# Patient Record
Sex: Female | Born: 1935 | Race: White | Hispanic: No | Marital: Single | Smoking: Former smoker
Health system: Southern US, Community
[De-identification: ages and names within clinical notes are randomized; demographics above are authoritative.]

## PROBLEM LIST (undated history)

## (undated) DIAGNOSIS — E785 Hyperlipidemia, unspecified: Secondary | ICD-10-CM

## (undated) DIAGNOSIS — R911 Solitary pulmonary nodule: Secondary | ICD-10-CM

## (undated) DIAGNOSIS — J8410 Pulmonary fibrosis, unspecified: Secondary | ICD-10-CM

## (undated) DIAGNOSIS — F0280 Dementia in other diseases classified elsewhere without behavioral disturbance: Secondary | ICD-10-CM

## (undated) DIAGNOSIS — I491 Atrial premature depolarization: Secondary | ICD-10-CM

## (undated) DIAGNOSIS — I10 Essential (primary) hypertension: Secondary | ICD-10-CM

## (undated) DIAGNOSIS — R002 Palpitations: Secondary | ICD-10-CM

## (undated) HISTORY — DX: Solitary pulmonary nodule: R91.1

## (undated) HISTORY — DX: Hyperlipidemia, unspecified: E78.5

## (undated) HISTORY — DX: Atrial premature depolarization: I49.1

## (undated) HISTORY — DX: Palpitations: R00.2

## (undated) HISTORY — PX: DILATION AND CURETTAGE OF UTERUS: SHX78

## (undated) HISTORY — DX: Essential (primary) hypertension: I10

---

## 1997-10-10 ENCOUNTER — Other Ambulatory Visit: Admission: RE | Admit: 1997-10-10 | Discharge: 1997-10-10 | Payer: Self-pay | Admitting: *Deleted

## 1998-09-22 ENCOUNTER — Ambulatory Visit (HOSPITAL_COMMUNITY): Admission: RE | Admit: 1998-09-22 | Discharge: 1998-09-22 | Payer: Self-pay | Admitting: Specialist

## 1998-10-13 ENCOUNTER — Other Ambulatory Visit: Admission: RE | Admit: 1998-10-13 | Discharge: 1998-10-13 | Payer: Self-pay | Admitting: *Deleted

## 1999-01-14 ENCOUNTER — Encounter: Admission: RE | Admit: 1999-01-14 | Discharge: 1999-01-14 | Payer: Self-pay | Admitting: *Deleted

## 1999-01-14 ENCOUNTER — Encounter: Payer: Self-pay | Admitting: *Deleted

## 1999-10-27 ENCOUNTER — Other Ambulatory Visit: Admission: RE | Admit: 1999-10-27 | Discharge: 1999-10-27 | Payer: Self-pay | Admitting: *Deleted

## 2000-01-22 ENCOUNTER — Encounter: Admission: RE | Admit: 2000-01-22 | Discharge: 2000-01-22 | Payer: Self-pay | Admitting: Rheumatology

## 2000-01-22 ENCOUNTER — Encounter: Payer: Self-pay | Admitting: Rheumatology

## 2000-10-31 ENCOUNTER — Other Ambulatory Visit: Admission: RE | Admit: 2000-10-31 | Discharge: 2000-10-31 | Payer: Self-pay | Admitting: *Deleted

## 2001-01-23 ENCOUNTER — Encounter: Payer: Self-pay | Admitting: Internal Medicine

## 2001-01-23 ENCOUNTER — Encounter: Admission: RE | Admit: 2001-01-23 | Discharge: 2001-01-23 | Payer: Self-pay | Admitting: Internal Medicine

## 2001-03-15 HISTORY — PX: BREAST EXCISIONAL BIOPSY: SUR124

## 2001-08-16 ENCOUNTER — Ambulatory Visit (HOSPITAL_COMMUNITY): Admission: RE | Admit: 2001-08-16 | Discharge: 2001-08-16 | Payer: Self-pay | Admitting: Gastroenterology

## 2002-02-12 ENCOUNTER — Encounter: Payer: Self-pay | Admitting: Internal Medicine

## 2002-02-12 ENCOUNTER — Encounter: Admission: RE | Admit: 2002-02-12 | Discharge: 2002-02-12 | Payer: Self-pay | Admitting: Internal Medicine

## 2002-08-24 ENCOUNTER — Encounter: Admission: RE | Admit: 2002-08-24 | Discharge: 2002-08-24 | Payer: Self-pay | Admitting: General Surgery

## 2002-08-24 ENCOUNTER — Encounter: Payer: Self-pay | Admitting: General Surgery

## 2002-08-27 ENCOUNTER — Ambulatory Visit (HOSPITAL_BASED_OUTPATIENT_CLINIC_OR_DEPARTMENT_OTHER): Admission: RE | Admit: 2002-08-27 | Discharge: 2002-08-27 | Payer: Self-pay | Admitting: General Surgery

## 2002-08-27 ENCOUNTER — Encounter (INDEPENDENT_AMBULATORY_CARE_PROVIDER_SITE_OTHER): Payer: Self-pay | Admitting: Specialist

## 2003-02-26 ENCOUNTER — Encounter: Admission: RE | Admit: 2003-02-26 | Discharge: 2003-02-26 | Payer: Self-pay | Admitting: Internal Medicine

## 2004-03-18 ENCOUNTER — Ambulatory Visit (HOSPITAL_COMMUNITY): Admission: RE | Admit: 2004-03-18 | Discharge: 2004-03-18 | Payer: Self-pay | Admitting: Internal Medicine

## 2004-09-21 ENCOUNTER — Encounter: Admission: RE | Admit: 2004-09-21 | Discharge: 2004-09-21 | Payer: Self-pay | Admitting: Orthopedic Surgery

## 2004-09-22 ENCOUNTER — Ambulatory Visit (HOSPITAL_COMMUNITY): Admission: RE | Admit: 2004-09-22 | Discharge: 2004-09-22 | Payer: Self-pay | Admitting: Orthopedic Surgery

## 2004-09-22 ENCOUNTER — Ambulatory Visit (HOSPITAL_BASED_OUTPATIENT_CLINIC_OR_DEPARTMENT_OTHER): Admission: RE | Admit: 2004-09-22 | Discharge: 2004-09-22 | Payer: Self-pay | Admitting: Orthopedic Surgery

## 2004-09-23 ENCOUNTER — Encounter: Admission: RE | Admit: 2004-09-23 | Discharge: 2004-09-23 | Payer: Self-pay | Admitting: Orthopedic Surgery

## 2004-10-05 ENCOUNTER — Ambulatory Visit (HOSPITAL_COMMUNITY): Admission: RE | Admit: 2004-10-05 | Discharge: 2004-10-05 | Payer: Self-pay | Admitting: Thoracic Surgery

## 2004-10-19 ENCOUNTER — Ambulatory Visit (HOSPITAL_COMMUNITY): Admission: RE | Admit: 2004-10-19 | Discharge: 2004-10-19 | Payer: Self-pay | Admitting: Thoracic Surgery

## 2004-10-19 ENCOUNTER — Encounter (INDEPENDENT_AMBULATORY_CARE_PROVIDER_SITE_OTHER): Payer: Self-pay | Admitting: *Deleted

## 2004-11-18 ENCOUNTER — Encounter: Admission: RE | Admit: 2004-11-18 | Discharge: 2004-11-18 | Payer: Self-pay | Admitting: Thoracic Surgery

## 2005-03-03 ENCOUNTER — Encounter: Admission: RE | Admit: 2005-03-03 | Discharge: 2005-03-03 | Payer: Self-pay | Admitting: Thoracic Surgery

## 2005-03-23 ENCOUNTER — Encounter: Admission: RE | Admit: 2005-03-23 | Discharge: 2005-03-23 | Payer: Self-pay | Admitting: Family Medicine

## 2005-07-07 ENCOUNTER — Encounter: Admission: RE | Admit: 2005-07-07 | Discharge: 2005-07-07 | Payer: Self-pay | Admitting: Thoracic Surgery

## 2006-01-11 ENCOUNTER — Encounter: Admission: RE | Admit: 2006-01-11 | Discharge: 2006-01-11 | Payer: Self-pay | Admitting: Thoracic Surgery

## 2006-03-30 ENCOUNTER — Ambulatory Visit (HOSPITAL_COMMUNITY): Admission: RE | Admit: 2006-03-30 | Discharge: 2006-03-30 | Payer: Self-pay | Admitting: Family Medicine

## 2006-07-12 ENCOUNTER — Encounter: Admission: RE | Admit: 2006-07-12 | Discharge: 2006-07-12 | Payer: Self-pay | Admitting: Thoracic Surgery

## 2006-07-12 ENCOUNTER — Ambulatory Visit: Payer: Self-pay | Admitting: Thoracic Surgery

## 2007-01-10 ENCOUNTER — Ambulatory Visit: Payer: Self-pay | Admitting: Thoracic Surgery

## 2007-01-10 ENCOUNTER — Encounter: Admission: RE | Admit: 2007-01-10 | Discharge: 2007-01-10 | Payer: Self-pay | Admitting: Thoracic Surgery

## 2007-04-05 ENCOUNTER — Ambulatory Visit (HOSPITAL_COMMUNITY): Admission: RE | Admit: 2007-04-05 | Discharge: 2007-04-05 | Payer: Self-pay | Admitting: Family Medicine

## 2007-05-12 ENCOUNTER — Encounter: Admission: RE | Admit: 2007-05-12 | Discharge: 2007-05-12 | Payer: Self-pay | Admitting: Family Medicine

## 2007-06-22 ENCOUNTER — Encounter: Admission: RE | Admit: 2007-06-22 | Discharge: 2007-06-22 | Payer: Self-pay | Admitting: Thoracic Surgery

## 2007-06-22 ENCOUNTER — Ambulatory Visit: Payer: Self-pay | Admitting: Thoracic Surgery

## 2007-07-07 ENCOUNTER — Ambulatory Visit: Payer: Self-pay | Admitting: Pulmonary Disease

## 2007-07-07 DIAGNOSIS — R930 Abnormal findings on diagnostic imaging of skull and head, not elsewhere classified: Secondary | ICD-10-CM | POA: Insufficient documentation

## 2007-07-07 DIAGNOSIS — E785 Hyperlipidemia, unspecified: Secondary | ICD-10-CM | POA: Insufficient documentation

## 2007-07-07 DIAGNOSIS — J471 Bronchiectasis with (acute) exacerbation: Secondary | ICD-10-CM | POA: Insufficient documentation

## 2007-10-09 ENCOUNTER — Telehealth (INDEPENDENT_AMBULATORY_CARE_PROVIDER_SITE_OTHER): Payer: Self-pay | Admitting: *Deleted

## 2007-10-10 ENCOUNTER — Ambulatory Visit: Payer: Self-pay | Admitting: Pulmonary Disease

## 2007-10-14 ENCOUNTER — Encounter: Payer: Self-pay | Admitting: Pulmonary Disease

## 2007-10-17 ENCOUNTER — Telehealth: Payer: Self-pay | Admitting: Pulmonary Disease

## 2007-10-17 ENCOUNTER — Ambulatory Visit: Payer: Self-pay | Admitting: Pulmonary Disease

## 2007-10-18 ENCOUNTER — Encounter: Payer: Self-pay | Admitting: Pulmonary Disease

## 2007-11-15 ENCOUNTER — Ambulatory Visit: Payer: Self-pay | Admitting: Pulmonary Disease

## 2007-11-15 DIAGNOSIS — J479 Bronchiectasis, uncomplicated: Secondary | ICD-10-CM

## 2007-11-15 DIAGNOSIS — A310 Pulmonary mycobacterial infection: Secondary | ICD-10-CM | POA: Insufficient documentation

## 2007-11-15 HISTORY — DX: Bronchiectasis, uncomplicated: J47.9

## 2007-12-12 ENCOUNTER — Telehealth (INDEPENDENT_AMBULATORY_CARE_PROVIDER_SITE_OTHER): Payer: Self-pay | Admitting: *Deleted

## 2008-01-02 ENCOUNTER — Encounter: Admission: RE | Admit: 2008-01-02 | Discharge: 2008-01-02 | Payer: Self-pay | Admitting: Thoracic Surgery

## 2008-01-02 ENCOUNTER — Ambulatory Visit: Payer: Self-pay | Admitting: Thoracic Surgery

## 2008-01-08 ENCOUNTER — Telehealth (INDEPENDENT_AMBULATORY_CARE_PROVIDER_SITE_OTHER): Payer: Self-pay | Admitting: *Deleted

## 2008-01-08 ENCOUNTER — Ambulatory Visit: Payer: Self-pay | Admitting: Pulmonary Disease

## 2008-01-10 ENCOUNTER — Telehealth: Payer: Self-pay | Admitting: Pulmonary Disease

## 2008-03-19 ENCOUNTER — Ambulatory Visit: Payer: Self-pay | Admitting: Pulmonary Disease

## 2008-04-24 ENCOUNTER — Ambulatory Visit (HOSPITAL_COMMUNITY): Admission: RE | Admit: 2008-04-24 | Discharge: 2008-04-24 | Payer: Self-pay | Admitting: Family Medicine

## 2008-09-17 ENCOUNTER — Ambulatory Visit: Payer: Self-pay | Admitting: Pulmonary Disease

## 2009-05-07 ENCOUNTER — Ambulatory Visit (HOSPITAL_COMMUNITY): Admission: RE | Admit: 2009-05-07 | Discharge: 2009-05-07 | Payer: Self-pay | Admitting: Family Medicine

## 2009-08-04 ENCOUNTER — Ambulatory Visit: Payer: Self-pay | Admitting: Pulmonary Disease

## 2009-08-04 DIAGNOSIS — R0789 Other chest pain: Secondary | ICD-10-CM | POA: Insufficient documentation

## 2010-02-20 ENCOUNTER — Ambulatory Visit: Payer: Self-pay | Admitting: Cardiology

## 2010-02-23 ENCOUNTER — Ambulatory Visit: Payer: Self-pay | Admitting: Cardiology

## 2010-04-02 ENCOUNTER — Other Ambulatory Visit (HOSPITAL_COMMUNITY): Payer: Self-pay | Admitting: Family Medicine

## 2010-04-02 DIAGNOSIS — Z0000 Encounter for general adult medical examination without abnormal findings: Secondary | ICD-10-CM

## 2010-04-02 DIAGNOSIS — Z1231 Encounter for screening mammogram for malignant neoplasm of breast: Secondary | ICD-10-CM

## 2010-04-14 NOTE — Progress Notes (Signed)
Summary: coughing up blood  Phone Note Call from Patient   Caller: Patient Call For: clance Summary of Call: pt coughed up blood concern new medication could have caused it Initial call taken by: Rickard Patience,  January 10, 2008 8:09 AM  Follow-up for Phone Call        Pt saw [facility] on 01/08/08 tx with Omnicef 300mg  2 tabs once daily x 7 days.  Pt states she coughed up BRB last night, clot of blood approx 1/2 tsp in amt.  Had 1 more episode this am similar amt.  Cough has improved since pt has been on abx.  Please advise. Follow-up by: Cloyde Reams RN,  January 10, 2008 2:26 PM  Additional Follow-up for Phone Call Additional follow up Details #1::        let pt know that it is common for bronchiectasis pts to cough up small amt of blood with infections.  Would give abx time to work, but call if blood quantity increases, or doesn't go away with abx. Additional Follow-up by: Barbaraann Share MD,  January 11, 2008 10:35 AM    Additional Follow-up for Phone Call Additional follow up Details #2::    Spoke with pt and advised KC's recs.  She will call if amount of blood increases or does not improve after finishes abx. Vernie Murders  January 11, 2008 10:50 AM

## 2010-04-14 NOTE — Progress Notes (Signed)
Summary: req lab results  Phone Note Call from Patient   Caller: Patient Call For: clance Summary of Call: results of labs from 7/31. (sputum).  Initial call taken by: Tivis Ringer,  October 17, 2007 10:10 AM  Follow-up for Phone Call        Called spoke with pt.  Pt states she is coughing a lot at the present time.  Mostly dry cough currently, but pt thinks it is headed back into an infection like she previously had.  Advised pt to come in and leave another sputum culture.  Pt agreed will put order into [facility] for AFB. Do you want to tx symptoms? Follow-up by: Cloyde Reams RN,  October 17, 2007 2:42 PM        Appended Document: req lab results ok to call in omnicef 300mg  two times a day for 5 days, no fills.  Appended Document: req lab results pt aware, rx was sent electronically

## 2010-04-14 NOTE — Assessment & Plan Note (Signed)
Summary: rov for bronchiectasis   Copy to:  Melinda Edwards Primary Provider/Referring Provider:  Maurice Edwards  CC:  Pt is here for a 6 month f/u appt.  Pt denied any changes in breathing. Pt states she will occ. have a non-productive cough.  Pt does c/o hoarseness and difficulty swallowing.   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  She is doing quite well, with no cough or congestion.  She denies any breathing issues, and has had no recent chest infections.  Denies fevers,chills, sweats.  Current Medications (verified): 1)  Lipitor 20 Mg  Tabs (Atorvastatin Calcium) .... Take 1 Tablet By Mouth Once A Day 2)  Lanoxin 0.125 Mg  Tabs (Digoxin) .... Take 1 Tablet By Mouth Once A Day 3)  Multivitamins   Tabs (Multiple Vitamin) .... Take 1 Tablet By Mouth Once A Day 4)  Aspirin Low Dose 81 Mg  Tabs (Aspirin) .... Take 1 Tablet By Mouth Twice A Week 5)  Caltrate 600+d 600-400 Mg-Unit  Tabs (Calcium Carbonate-Vitamin D) .... Take 1 Tablet By Mouth Two Times A Day 6)  Fish Oil 1000 Mg  Caps (Omega-3 Fatty Acids) .... Take 1 Tablet By Mouth Two Times A Day 7)  Simply Sleep .... Take By Mouth As Needed 8)  Benadryl .... Take By Mouth As Needed 9)  Tylenol .... Take By Mouth As Needed  Allergies (verified): 1)  ! Claritin  Vital Signs:  Patient profile:   77 year old female Height:      65 inches Weight:      115.25 pounds BMI:     19.25 O2 Sat:      97 % on Room air Temp:     97.4 degrees F oral Pulse rate:   120 / minute BP sitting:   112 / 74  (right arm) Cuff size:   regular  Vitals Entered By: Melinda Filter LPN (September 17, 1608 8:51 AM)  O2 Flow:  Room air CC: Pt is here for a 6 month f/u appt.  Pt denied any changes in breathing. Pt states she will occ. have a non-productive cough.  Pt does c/o hoarseness and difficulty swallowing.    Comments Medications reviewed with patient Melinda Filter LPN  September 18, 9602  8:51 AM    Physical Exam  General:  thin female in nad Lungs:  clear to auscultation Heart:  rrr, no mrg   Impression & Recommendations:  Problem # 1:  BRONCHIECTASIS WITHOUT ACUTE EXACERBATION (ICD-494.0) the pt is doing well from this standpoint.  She has had no recent exacerbation, no cough, no mucus.  I have asked her to stay active, and will see her back in one year.  Medications Added to Medication List This Visit: 1)  Aspirin Low Dose 81 Mg Tabs (Aspirin) .... Take 1 tablet by mouth twice a week 2)  Caltrate 600+d 600-400 Mg-unit Tabs (Calcium carbonate-vitamin d) .... Take 1 tablet by mouth two times a day 3)  Fish Oil 1000 Mg Caps (Omega-3 fatty acids) .... Take 1 tablet by mouth two times a day 4)  Simply Sleep  .... Take by mouth as needed 5)  Benadryl  .... Take by mouth as needed 6)  Tylenol  .... Take by mouth as needed  Other Orders: Est. Patient Level II (54098)  Patient Instructions: 1)  follow up with me in one year or sooner if problems.

## 2010-04-14 NOTE — Assessment & Plan Note (Signed)
Summary: consult for bronchiectasis   Referred by:  Dewayne Shorter PCP:  Maurice Small  Chief Complaint:  Pulmonary Consult.  History of Present Illness: the patient is a pleasant 77 year old female who I have been asked to see for an abnormal chest CT.  The patient has a history of a right lower lobe pulmonary nodule that is being followed with serial chest CTs.  On the last scan, she was also noted to have significant bronchiectasis in her right middle lobe and lingula, with superimposed nodular densities.  She also has had a recent cough with purulent mucus that is most consistent with acute bronchitis.  She responded very nicely to a course of Avelox from Dr. Edwyna Shell.  The patient states that at her baseline she has no significant cough or mucus production.  She has excellent exercise tolerance, and feels that she has no chronic pulmonary symptoms.  She has had no fevers, chills, or sweats, and her appetite has been stable.  She has had no significant weight loss.  There is no history of prior tuberculosis exposure.     Current Allergies: ! CLARITIN  Past Medical History:    Hyperlipidemia    history of pulmonary nodule   Family History:    heart disease: 3 brothers (m.i.) mother (CHF)  Social History:    Patient states former smoker.     pt is retired.    pt single.   Risk Factors:  Tobacco use:  quit    Year quit:  1984    Pack-years:  1 ppd    Comments:  smoked for approx 25 years    Review of Systems      See HPI   Vital Signs:  Patient Profile:   77 Years Old Female Height:     65 inches Weight:      116.25 pounds O2 Sat:      96 % O2 treatment:    Room Air Temp:     98.0 degrees F oral Pulse rate:   74 / minute BP sitting:   120 / 66  (left arm) Cuff size:   regular  Vitals Entered By: Cyndia Diver LPN (July 07, 2007 11:08 AM)             Is Patient Diabetic? No Comments Medications reviewed with patient    ..................................................................Marland KitchenCyndia Diver LPN  July 07, 2007 11:09 AM      Physical Exam  General:     thin female in no acute distress Eyes:     PERRLA and EOMI.   Nose:     patent without discharge Mouth:     clear  Neck:     no JVD, thyromegaly, or palpable lymphadenopathy Lungs:     totally clear to auscultation Heart:     regular rate and rhythm, no rubs or gallops.  There was a 1/6 systolic ejection murmur Abdomen:     soft and nontender, bowel sounds present Extremities:     no significant edema, pulses intact distally Neurologic:     alert and oriented, moves all 4 extremities.     Impression & Recommendations:  Problem # 1:  BRONCHIECTASIS WITH ACUTE EXACERBATION (ICD-494.1) the patient has classic right middle lobe and lingular bronchiectasis.  She is really not had a lot of difficulties with this, and I suspect that her recent infection was a mild flare.  She was treated appropriately with Avelox, and feels that she is back to her usual baseline.  I have had a  long discussion with her about bronchiectasis, and have answered all of her questions.  I see no reason why she cannot do very well with this, and will not need further pulmonary follow-up unless she begins to have more frequent infections.  I would like to get a sputum specimen if she has another acute exacerbation for both culture and AFB.  This will allow [place] to treat her with appropriate antibiotics focused on her known pathogen  Problem # 2:  CT, CHEST, ABNORMAL (ICD-793.1) the patient has concomitant nodular densities within her bronchiectasis.  I suspect that she is colonized with MAC.  We can try and establish this with a sputum culture if and when she has her next flareup.  At this point, there is nothing to suggest any more than just colonization.  Medications Added to Medication List This Visit: 1)  Lipitor 20 Mg Tabs (Atorvastatin calcium) .... Take 1 tablet  by mouth once a day 2)  Lanoxin 0.125 Mg Tabs (Digoxin) .... Take 1 tablet by mouth once a day 3)  Multivitamins Tabs (Multiple vitamin) .... Take 1 tablet by mouth once a day 4)  Aspirin Low Dose 81 Mg Tabs (Aspirin) .... Take 1 tablet by mouth once a day 5)  Caltrate 600+d 600-400 Mg-unit Tabs (Calcium carbonate-vitamin d) .... Take 1 tablet by mouth once a day 6)  Fish Oil 1000 Mg Caps (Omega-3 fatty acids) .... Take 1 tablet by mouth once a day 7)  Vitamin B12  .... Take 1 tablet by mouth once a day   Patient Instructions: 1)  the patient will need a sputum culture and sensitivity, as well as an AFB culture and sensitivity if she has another flareup. 2)  She will follow-up with me on a p.r.n. basis.    ]

## 2010-04-14 NOTE — Assessment & Plan Note (Signed)
Summary: rov for bronchiectasis   Referred by:  Dewayne Shorter PCP:  Maurice Small  Chief Complaint:  4 month follow-up visit.  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  She had a flare-up in Oct, but responded very well to abx.  She currently has no complaints, including cough or congestion.  She is eating well, and no specific constitutional symptoms.     Prior Medications Reviewed Using: Patient Recall  Updated Prior Medication List: LIPITOR 20 MG  TABS (ATORVASTATIN CALCIUM) Take 1 tablet by mouth once a day LANOXIN 0.125 MG  TABS (DIGOXIN) Take 1 tablet by mouth once a day MULTIVITAMINS   TABS (MULTIPLE VITAMIN) Take 1 tablet by mouth once a day ASPIRIN LOW DOSE 81 MG  TABS (ASPIRIN) Take 1 tablet by mouth once a day CALTRATE 600+D 600-400 MG-UNIT  TABS (CALCIUM CARBONATE-VITAMIN D) Take 1 tablet by mouth once a day FISH OIL 1000 MG  CAPS (OMEGA-3 FATTY ACIDS) Take 1 tablet by mouth once a day  Current Allergies (reviewed today): ! CLARITIN     Review of Systems      See HPI   Vital Signs:  Patient Profile:   77 Years Old Female Height:     65 inches Weight:      111.13 pounds O2 Sat:      94 % O2 treatment:    Room Air Temp:     97.5 degrees F oral Pulse rate:   79 / minute BP sitting:   132 / 74  (left arm) Cuff size:   regular  Vitals Entered By: Cloyde Reams RN (March 19, 2008 9:00 AM)             Comments Pt is here today for a follow-up visit.  Pt states breathing well.  Pt states cough is much better. Medications reviewed Cloyde Reams RN  March 19, 2008 9:03 AM      Physical Exam  General:     thin female in nad Lungs:     totally clear  Heart:     rrr      Impression & Recommendations:  Problem # 1:  BRONCHIECTASIS WITHOUT ACUTE EXACERBATION (ICD-494.0) the pt is doing well with no recent flare-ups.  She has no cough or congestion.  She will f/u with me in 6mos,  or sooner if problems develop.  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  She does have nodules that are probably attributable to this.   Patient Instructions: 1)  Please schedule a follow-up appointment in 6 months.   ]  Appended Document: Orders Update    Clinical Lists Changes  Orders: Added new Service order of Est. Patient Level II (16109) - Signed

## 2010-04-14 NOTE — Progress Notes (Signed)
Summary: sick  Phone Note Call from Patient Call back at Home Phone [phone]   Caller: Patient Call For: clance Reason for Call: Acute Illness, Talk to Nurse Summary of Call: coughing like crazy, discolored plegm, all in her head right now, but feels some is coming from chest. [facility] Initial call taken by: Eugene Gavia,  January 08, 2008 8:32 AM  Follow-up for Phone Call        Called spoke with pt.  Made appt today with VS at 10:00am.   Follow-up by: Cloyde Reams RN,  January 08, 2008 9:03 AM

## 2010-04-14 NOTE — Progress Notes (Signed)
Summary: set up labs- call pt first  Phone Note Call from Patient   Caller: Patient Call For: clance Summary of Call: thinks she may need labs set up for her "bronchiatasis". culture of her phlegm Initial call taken by: Tivis Ringer,  October 09, 2007 12:30 PM  Follow-up for Phone Call        lmtcb Philipp Deputy [facility]  October 09, 2007 3:18 PM   pt aware ok for sputum culture-order in comp-per dr clance's last note Follow-up by: Philipp Deputy CMA,  October 09, 2007 5:17 PM

## 2010-04-14 NOTE — Progress Notes (Signed)
Summary: H1N1  Phone Note Call from Patient Call back at Home Phone [phone]   Caller: Patient Call For: clance Reason for Call: Talk to Nurse Summary of Call: pt wants to know if she should take H1N1 shot when available?  If pt not available, LMOM> Initial call taken by: Eugene Gavia,  December 12, 2007 3:25 PM  Follow-up for Phone Call        please advise  if this is ok or not. thanks.  Cyndia Diver LPN  December 12, 2007 3:40 PM   Additional Follow-up for Phone Call Additional follow up Details #1::        I think she should Additional Follow-up by: Barbaraann Share MD,  December 13, 2007 6:09 PM    Additional Follow-up for Phone Call Additional follow up Details #2::    [facility] advising pt that Dr. Shelle Iron does think she should have the H1N1 vaccine when it is available.  I advised her to call back in mid October to see about getting it done here. Follow-up by: Vernie Murders,  December 14, 2007 9:08 AM

## 2010-04-14 NOTE — Assessment & Plan Note (Signed)
Summary: rov for bronchiectasis   Referred by:  Dewayne Shorter PCP:  Maurice Small  Chief Complaint:  Acute visit today.  Pt c/o prod cough x 3 wks with yellow/green sputum.  She was txed with round of omnicef on 10-17-07, this helped some, and but still has prod cough.  Pt denies any other complaints.  Currently taking sulfamethoxazole for UTI.Marland Kitchen  History of Present Illness: The pt comes in today for f/u of her bronchiectasis.  She had a recent flare-up and was treated with omnicef, given her sputum culture growing H.Flu.  Her AFB culture has not been finalized, but the initial smear was negative.  The pt still has a mild cough with small quantities of purulent mucus, but I explained to her that it will almost be impossible to sterilize her secretions, and at the cost of multidrug resistant organisms.  She continues to have no significant sinus symptoms.  She denies significant weight loss, or other constitutional symptoms.  She feels that her exercise tolerance is adequate.     Current Allergies: ! CLARITIN     Review of Systems      See HPI   Vital Signs:  Patient Profile:   77 Years Old Female Height:     65 inches Weight:      111.25 pounds O2 Sat:      95 % O2 treatment:    Room Air Temp:     97.7 degrees F oral Pulse rate:   96 / minute BP sitting:   130 / 78  (left arm)  Vitals Entered By: Vernie Murders (November 15, 2007 9:38 AM)                 Physical Exam  General:     thin female in nad Lungs:     fairly clear with occasional scattered rhonchi Heart:     rrr      Problem # 1:  BRONCHIECTASIS WITHOUT ACUTE EXACERBATION (ICD-494.0) the patient overall is doing fairly well. She does have a mild intermittent cough with small quantities of discolored mucus, but I have explained to her that it may be very difficult to totally sterilize her secretions given her bronchiectasis. I have gone over the "red flags", including fever, increased quantity of mucus, and  increased pulmonary symptoms of congestion and dyspnea. If these occur, she knows to call us immediately for antibiotic treatment. I would like to hold off on antibiotics at this time, and see how she does going forward.  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 If she develops symptoms that are suggestive of actual infection, she will need bronchoscopy for more accurate diagnosis if the expectorated AFB culture is negative.   Patient Instructions: 1)  Please schedule a follow-up appointment in 4 months. 2)  I will call you with results of afb culture.   ]

## 2010-04-14 NOTE — Assessment & Plan Note (Signed)
Summary: acute sick visit for atypical chest pain.   Copy to:  Dewayne Shorter Primary Provider/Referring Provider:  Maurice Small  CC:  Pt is here for a sick visit.  Pt was last seen July 2010.   Pt c/o L side chest pain while lying in bed x 1 week.  Pt denies increased sob or cough.  .  History of Present Illness: the patient comes in today for an acute sick visit related to atypical chest pain. She has a history of known bronchiectasis with MAC colonization, but has done extremely well with no recurrent exacerbation or constitutional symptoms. She was last seen in July of 2010. She comes in today with a one-week history of left-sided chest discomfort while lying in bed, and is concerned that it is related to a pulmonary infection. However, she denies any increasing shortness of breath, congestion, cough, or fever. She has maintained her weight, and has a good appetite. The pain is not pleuritic in nature, and she has not had any significant lower extremity edema.  Current Medications (verified): 1)  Lipitor 20 Mg  Tabs (Atorvastatin Calcium) .... Take 1 Tablet By Mouth Once A Day 2)  Lanoxin 0.125 Mg  Tabs (Digoxin) .... Take 1 Tablet By Mouth Once A Day 3)  Multivitamins   Tabs (Multiple Vitamin) .... Take 1 Tablet By Mouth Once A Day 4)  Aspirin Low Dose 81 Mg  Tabs (Aspirin) .... Take 1 Tablet By Mouth Twice A Week 5)  Caltrate 600+d 600-400 Mg-Unit  Tabs (Calcium Carbonate-Vitamin D) .... Take 1 Tablet By Mouth Two Times A Day 6)  Fish Oil 1000 Mg  Caps (Omega-3 Fatty Acids) .... Take 1 Tablet By Mouth Two Times A Day 7)  Benadryl .... Take By Mouth As Needed 8)  Tylenol .... Take By Mouth As Needed  Allergies (verified): 1)  ! Claritin  Review of Systems       The patient complains of chest pain, difficulty swallowing, sneezing, and joint stiffness or pain.  The patient denies shortness of breath with activity, shortness of breath at rest, productive cough, non-productive cough,  coughing up blood, irregular heartbeats, acid heartburn, indigestion, loss of appetite, weight change, abdominal pain, sore throat, tooth/dental problems, headaches, nasal congestion/difficulty breathing through nose, itching, ear ache, anxiety, depression, hand/feet swelling, rash, change in color of mucus, and fever.    Vital Signs:  Patient profile:   77 year old female Height:      65 inches Weight:      121.13 pounds BMI:     20.23 O2 Sat:      96 % on Room air Temp:     98.2 degrees F oral Pulse rate:   77 / minute BP sitting:   116 / 74  (left arm) Cuff size:   regular  Vitals Entered By: Arman Filter LPN (Aug 04, 2009 2:03 PM)  O2 Flow:  Room air CC: Pt is here for a sick visit.  Pt was last seen July 2010.   Pt c/o L side chest pain while lying in bed x 1 week.  Pt denies increased sob or cough.   Comments Medications reviewed with patient Arman Filter LPN  Aug 04, 2009 2:03 PM    Physical Exam  General:  thin female in nad Nose:  no purulence noted. Lungs:  clear to auscultation Heart:  rrr, no mrg Extremities:  no edema or cyanosis Neurologic:  alert and oriented, moves all 4.   Impression & Recommendations:  Problem # 1:  CHEST PAIN, ATYPICAL (ICD-786.59) the patient's chest discomfort seems more musculoskeletal than anything else. There is nothing to indicate a pulmonary infection, her chest is clear to auscultation, and her chest x-ray today shows no acute changes. I have asked her to try conservative treatment with nonsteroidals and also local heat. She is to let me know if her pain worsens or if it does not resolve.  Problem # 2:  BRONCHIECTASIS WITHOUT ACUTE EXACERBATION (ICD-494.0) the patient is stable from a bronchiectasis standpoint, and there is nothing to suggest an acute exacerbation.  Other Orders: Est. Patient Level IV (16109) T-2 View CXR (71020TC)  Patient Instructions: 1)   would try advil and give this a little more time to resolve 2)   please call if symptoms worsen or if they do not resolve. 3)  cancel upcoming apptm and reschedule for one year.

## 2010-04-14 NOTE — Assessment & Plan Note (Signed)
Summary: acute sick visit for bronchiectasis exacerbation.   Referred by:  Melinda Edwards PCP:  Maurice Small  Chief Complaint:  Pt is here for a sick visit.  pt c/o cough with yellow/green sputum x 4 to 5 days.  pt also c/o yellow nasal drainage. Melinda Edwards  History of Present Illness: the pt comes in today for an acute sick visit.  She gives a 5 day history of cough and congestion with purulent mucus, and also has sinus pressure with purulence from nares.  Denies any fever or worsening sob.     Current Allergies: ! CLARITIN     Review of Systems      See HPI   Vital Signs:  Patient Profile:   77 Years Old Female Height:     65 inches Weight:      114.56 pounds O2 Sat:      94 % O2 treatment:    Room Air Temp:     97.9 degrees F oral Pulse rate:   96 / minute BP sitting:   112 / 78  (left arm) Cuff size:   regular  Vitals Entered By: Cyndia Diver LPN (January 08, 2008 10:27 AM)             Comments Medications reviewed with patient Cyndia Diver LPN  January 08, 2008 10:28 AM        Physical Exam  General:     wd female in nad Lungs:     fairly clear. Heart:     rrr, no mrg      Impression & Recommendations:  Problem # 1:  BRONCHIECTASIS WITH ACUTE EXACERBATION (ICD-494.1) the pt probably has acute sinusitis along with a flare of her bronchiectasis.  She will need a course of antibiotics for this.  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  Recent ct chest shows improvement or stability in her prior findings.  Medications Added to Medication List This Visit: 1)  Omnicef 300 Mg Caps (Cefdinir) .... Two tablets (600 mg) by mouth daily   Patient Instructions: 1)  take omnicef 600mg  each day for 7days 2)  can take mucinex to help with mucus if needed 3)  keep upcoming f/u with me.   Prescriptions: OMNICEF 300 MG  CAPS (CEFDINIR) Two tablets (600 mg) by mouth daily  #14 x 0   Entered and  Authorized by:   Barbaraann Share MD   Signed by:   Barbaraann Share MD on 01/08/2008   Method used:   Electronically to        [facility]  [phone]* (retail)       [address] [place], [place]  [address]       Ph: [phone] or [phone]       Fax: [phone]   RxID:   [email or web address]  ]

## 2010-05-13 ENCOUNTER — Ambulatory Visit (HOSPITAL_COMMUNITY)
Admission: RE | Admit: 2010-05-13 | Discharge: 2010-05-13 | Disposition: A | Discharge status: Home / Self Care | Payer: Medicare Other | Source: Ambulatory Visit | Attending: Family Medicine | Admitting: Family Medicine

## 2010-05-13 DIAGNOSIS — Z1231 Encounter for screening mammogram for malignant neoplasm of breast: Secondary | ICD-10-CM | POA: Insufficient documentation

## 2010-07-28 ENCOUNTER — Telehealth: Payer: Self-pay | Admitting: Cardiology

## 2010-07-28 NOTE — Assessment & Plan Note (Signed)
OFFICE VISIT   Melinda Edwards, Melinda Edwards  DOB:  08-Apr-1926                                        June 22, 2007  CHART #:  [id number]   REASON FOR OFFICE VISIT:  A 59-month followup CT of the chest.   SUMMARY:  The patient was last seen by Dr. Edwyna Shell on January 10, 2007.  At that time the CT of the chest appeared to be similar to the one prior  except there was a question as to whether or not there was one small  area that was more prominent.  The patient had a repeat CAT scan done  which showed a stable 8-mm scar of the right lower lobe, scattered  chronic lung scarring, interval interstitial pneumonitis or scarring in  the lingula and lesser right middle lobe, otherwise stable chronic  findings.  The patient's only complaint at this time is a cough with sputum  production which she describes as yellowish-to-greenish.  This first  occurred in January.  She presented to an urgent care center.  According  to the patient she was told she did not have pneumonia or bronchitis.  She was given antibiotics, however, she continues to still have the  cough with sputum production.  She denies any fever, chills, or night  sweats, denies any shortness of breath.   PHYSICAL EXAMINATION:  Vital signs:  Her blood pressure is 110/64, pulse  rate 79, respirations 18, O2 sat 98% on room air.  Cardiovascular:  Regular rate and rhythm.  Pulmonary:  Lungs are clear to auscultation  bilaterally.  No rales, wheezes, rhonchi.  Lung sounds are slightly  diminished at the bases.  Extremities:  No cyanosis, clubbing, or edema.   IMPRESSION AND PLAN:  The patient was given a prescription for Avelox  400 mg by mouth for 7 days.  She is also going to be referred to a  pulmonologist.  Dr. Edwyna Shell will see her in 6  months with a followup CT of the chest.  Please note that the patient  has been followed for these  inflammatory lesions in her lungs for almost 2 years and it was thought  that  these might be related to mycobacterium avium.  The patient is to  call for any questions or concerns.   Doree Fudge, PA   DZ/MEDQ  D:  06/22/2007  T:  06/22/2007  Job:  [id number]

## 2010-07-28 NOTE — Telephone Encounter (Signed)
Advised patient

## 2010-07-28 NOTE — Telephone Encounter (Signed)
Wanted Melinda Edwards to know that she has been have chest and arm discomfort for the past several days and wants to talk to RN about it.

## 2010-07-28 NOTE — Telephone Encounter (Signed)
Spoke with patient and she stated no chest pains, the pain was in her right shoulder.  The pain did move down into her arm and then her arm felt tired.  Denies any shortness of breath, chest pain, or weakness in right arm.  Does have PCP.  Please advise.

## 2010-07-28 NOTE — Assessment & Plan Note (Signed)
OFFICE VISIT   Melinda Edwards, Melinda Edwards  DOB:  06/04/1926                                        January 02, 2008  CHART #:  [id number]   HISTORY:  The patient has been followed for several years, now for a  abnormal chest CT scan.  She has been followed with serial CT scans and  had one on today's date.  Primarily, she has been treated for two  issues.  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  She has been  seen by Dr. Marcelyn Bruins as well for her pulmonary management.  Currently, she reports that she is feeling well overall.  Her cough has  lessened as has her sputum production.  She reports no new pulmonary  symptoms.   CT scan was obtained and it appears that the primary nodule that we have  been following has actually somewhat lessened in size.  She has other  chronic findings that appeared to be stable.   PHYSICAL EXAMINATION:  VITAL SIGNS:  Blood pressure 118/68, pulse 84,  respirations 18, and oxygen saturation is 98%.  GENERAL:  This is a well-developed adult female in no acute distress.  PULMONARY:  Clear breath sounds throughout.  CARDIAC:  Regular rate and rhythm.   ASSESSMENT:  The patient is doing quite well and is stable from any  thoracic surgical issues and does not appear to require any ongoing  further surgical management.  She will continue followup with Dr. Shelle Iron  as he requests.  We will of course see her as needed on a p.r.n. basis.  Dr. Edwyna Shell has seen the patient on today's date and does not feel that  we need to continue with ongoing followup.   Rowe Clack, P.A.-C.   Melinda Edwards  D:  01/02/2008  T:  01/02/2008  Job:  [id number]   cc:   Barbaraann Share, MD,FCCP  Ines Bloomer, M.D.

## 2010-07-28 NOTE — Letter (Signed)
January 10, 2007   Melinda Edwards. Valentina Lucks, M.D.  [address] [place]  [place] [address]   Re:  [name], [name]               DOB:  09/11/1926   Dear. Dr. Valentina Lucks:   I saw [name] back in the office today. Her blood pressure is  140/82, pulse 80, respiratory rate 18, saturations were 97%. We have  been following these inflammatory lesions in her lungs for almost 2  years and it was thought to be some type of possible microbacterium  avium. We repeated her CT scan today and everything appears to be the  same, except for 1 in the right lower lobe, which they read as possibly  more prominent. I am not sure that is the case but they recommend  following it up again in 6 months and with that being the case, I will  follow their recommendations and see her again in 6 months with a CT  scan.   Ines Bloomer, M.D.  Electronically Signed   DPB/MEDQ  D:  01/10/2007  T:  01/10/2007  Job:  [id number]

## 2010-07-28 NOTE — Telephone Encounter (Signed)
I suggested she check with her primary care provider regarding these symptoms which do not sound cardiac.

## 2010-07-28 NOTE — Telephone Encounter (Signed)
Advised to call PCP,  Verbalized understanding.

## 2010-07-31 ENCOUNTER — Encounter: Payer: Self-pay | Admitting: *Deleted

## 2010-07-31 ENCOUNTER — Encounter: Payer: Self-pay | Admitting: Pulmonary Disease

## 2010-07-31 NOTE — Op Note (Signed)
   NAME:  Melinda Edwards, Melinda Edwards                         ACCOUNT NO.:  [id number]   MEDICAL RECORD NO.:  [id number]                   PATIENT TYPE:  AMB   LOCATION:  DSC                                  FACILITY:  [facility]   PHYSICIAN:  Gita Kudo, M.D.              DATE OF BIRTH:  June 06, 1926   DATE OF PROCEDURE:  08/27/2002  DATE OF DISCHARGE:                                 OPERATIVE REPORT   PROCEDURE:  Excision right breast mass.   SURGEON:  Gita Kudo, M.D.   ANESTHESIA:  MAC - IV sedation, local 1% Xylocaine   PREOPERATIVE DIAGNOSIS:  Mass right breast.   POSTOPERATIVE DIAGNOSIS:  Mass right breast, pending pathology.   CLINICAL SUMMARY:  77 year old female with a lump noted in her right breast.  Needle aspiration was non conclusive.  A mammogram approximately six months  ago was negative.  Physical examination showed an indeterminate mass and  biopsy was elected.   OPERATIVE FINDINGS:  I went widely around the abnormality located in the  right breast at approximately 6:30.  A permanent section was requested.   OPERATIVE PROCEDURE:  Under satisfactory intravenous sedation, the patient's  breast and chest were prepped and draped in the standard fashion.  One  percent Xylocaine was infiltrated for good analgesia.  A curved incision  made centered over the mass and then skin flaps of a small distance were  developed and the abnormal breast tissue removed by a combination of blunt,  cautery and scout pallido section.  After the specimen was removed, it was  marked with suture and sent to pathology.  Careful digital palpation from  inside the wound did not reveal any other  abnormality.  The breast was then lavaged with saline, made dry by cautery  and closed in layers with interrupted 3-0 Vicryl and nylon for skin.  Sterile absorbant compressive dressing applied and the patient went to the  recovery room from the operating room in good condition without   complication.                                                Gita Kudo, M.D.    MRL/MEDQ  D:  08/27/2002  T:  08/27/2002  Job:  [id number]   cc:   Cassell Clement, M.D.  [address], [address]  [place]  [place] [address]  Fax: [phone]   S. Kyra Manges, M.D.  [phone] [address]  [place]  [place] [address]  Fax: [phone]

## 2010-07-31 NOTE — Op Note (Signed)
[facility]  Patient:    Melinda Edwards, Melinda Edwards Visit Number: [id number] MRN: [id number]          Service Type: END Location: ENDO Attending Physician:  Dennison Bulla Ii Dictated by:   Verlin Grills, M.D. Proc. Date: 08/16/01 Admit Date:  08/16/2001 Discharge Date: 08/16/2001   CC:         Molly Maduro D. Vaughan Basta., M.D.  Demetria Pore. Coral Spikes, M.D.   Operative Report  PROCEDURE:  Screening colonoscopy.  REFERRING PHYSICIANS: 1. Barbette Hair. Vaughan Basta., M.D. 2. Demetria Pore. Coral Spikes, M.D.  PROCEDURE INDICATION:  The patient is a 77 year old female born 1926/03/23. She is referred to me for a screening colonoscopy with polypectomy to prevent colon cancer.  She underwent a colonoscopy in 1995 and hyperplastic polyps were removed.  I discussed with the patient the complications associated with colonoscopy and polypectomy, including a 15:1,000 risks of bleeding, a 4:1,000 risk of colon perforation requiring surgical repair.  The patient has signed the operative permit.  ENDOSCOPIST:  Verlin Grills, M.D.  PREMEDICATION:  Versed 5 mg, Demerol 50 mg.  ENDOSCOPE:  Olympus pediatric colonoscope.  DESCRIPTION OF PROCEDURE:  After obtaining informed consent, the patient was placed in the left lateral decubitus position.  I administered intravenous Demerol and intravenous Versed to achieve conscious sedation for the procedure.  The patients blood pressure, oxygen saturation and cardiac rhythm were monitored throughout the procedure and documented in the medical record.  Anal inspection was normal.  Digital rectal examination was normal.  The Olympus pediatric video colonoscope was introduced into the rectum and advanced to the cecum.  Colonic preparation for the exam today was excellent.  FINDINGS:  The patient has diverticula involving the left and right colon, without signs of diverticulitis or diverticular stricture  formation.  RECTUM:  Normal. SIGMOID AND DESCENDING COLON:  Normal. SPLENIC FLEXURE:  Normal. TRANSVERSE COLON:  Normal. HEPATIC FLEXURE:  Normal. ASCENDING COLON:  Normal. CECUM AT ILEOCECAL VALVE:  Normal.  ASSESSMENT:  Normal proctocolonoscopy to the cecum.  No endoscopic evidence for the presence of colorectal neoplasia.  A few diverticula are noted in the left and right colon.  RECOMMENDATIONS:  Repeat colonoscopy in 10 years. Dictated by:   Verlin Grills, M.D. Attending Physician:  Dennison Bulla Ii DD:  08/16/01 TD:  08/18/01 Job: [id number]

## 2010-07-31 NOTE — Op Note (Signed)
[name], Melinda Edwards               ACCOUNT NO.:  [id number]   MEDICAL RECORD NO.:  [id number]          PATIENT TYPE:  AMB   LOCATION:  DSC                          FACILITY:  [facility]   PHYSICIAN:  Leonides Grills, M.D.     DATE OF BIRTH:  [birth year]   DATE OF PROCEDURE:  09/22/2004  DATE OF DISCHARGE:                                 OPERATIVE REPORT   PREOPERATIVE DIAGNOSIS:  Left hallux valgus.   POSTOPERATIVE DIAGNOSIS:  Left hallux valgus.   OPERATIONS:  1.  Left chevron bunionectomy.  2.  Stress x-rays left foot.   ANESTHESIA:  General.   SURGEON:  Durene Romans. Lestine Box, M.D.   ASSISTANT:  Lianne Cure, P.A.-C.   ESTIMATED BLOOD LOSS:  Minimal.   TOURNIQUET TIME:  Approximately 1/2 hour.   COMPLICATIONS:  None.   DISPOSITION:  Stable to the PAR.   INDICATIONS:  This is a 77 year old female who has had longstanding medial  left great toe pain secondary to a bunion.  It was interfering with her left  to the point where she could not do what she wanted to do.  She was  consented to the above procedure.  All risks, which include infection, nerve  and vessel injury, nonunion, malunion, hardware rotation, hardware failure,  persistent pain, worsening pain, stiffness, arthritis, recurrence of  deformity, and prolonged recovery were all explained.  Questions were  encouraged and answered.   OPERATION:  The patient was brought to the operating room and placed in the  supine position.  After adequate general endotracheal tube anesthesia was  administered as well as Ancef 1 g IV piggyback, the left lower extremity was  then prepped and draped in a sterile manner.  With a proximally placed thigh  tourniquet, the limb was gravity exsanguinated, and the tourniquet was  elevated to 290 mmHg.  Once the left lower extremity was prepped and draped  in a sterile manner, a longitudinal incision on the medial aspect of the  great toe MTP joint was then made in the midline.  Dissection was  carried  down through skin and hemostasis obtained.  Neurovascular structures were  identified both superiorly and inferiorly and protected throughout the case.  L-shaped capsulotomy was then performed.  Simple bunionectomy was then  performed with a sagittal saw.  __________ and rounded off with a rongeur.  Lateral capsule was then released with a curved Beaver blade.  Soft tissue  was then elevated off the superior dorsal aspect of the metatarsal.  Center  head was then identified and marked with a marker.  A chevron osteotomy was  then created.  The head was then translated about 3 or 4 mm laterally and  then fixed with a 2 mm fully threaded cortical set screw using a 1.5 mm  drill hole, respectively.  This had excellent purchase and maintenance of  the correction.  Redundant bone medially was then trimmed off with a  sagittal saw.  Joint and area were copiously irrigated with normal saline.  Capsule was then repaired and advanced both superiorly and proximally fixed  with 2-0 Vicryl suture.  This had an Conservation officer, historic buildings.  Stress x-rays were  then obtained in the AP and lateral planes.  It showed no gross motion of  the osteotomy site.  Toe was in excellent alignment as well as fixation.  Again, tourniquet was deflated.  Hemostasis obtained.  The area was  copiously irrigated with normal saline.  Skin was closed with 4-0 nylon  suture.  Sterile dressing was applied.  Hard sole shoe was applied.  The  patient was stable to the PAR.       PB/MEDQ  D:  09/22/2004  T:  09/22/2004  Job:  [id number]

## 2010-08-03 ENCOUNTER — Telehealth: Payer: Self-pay | Admitting: Pulmonary Disease

## 2010-08-03 NOTE — Telephone Encounter (Signed)
Moved appt. Message taken care of.

## 2010-08-04 ENCOUNTER — Ambulatory Visit: Payer: Self-pay | Admitting: Pulmonary Disease

## 2010-08-04 ENCOUNTER — Encounter: Payer: Self-pay | Admitting: Pulmonary Disease

## 2010-08-04 ENCOUNTER — Ambulatory Visit: Payer: Medicare Other | Admitting: Pulmonary Disease

## 2010-08-07 ENCOUNTER — Ambulatory Visit: Payer: Medicare Other | Admitting: Pulmonary Disease

## 2010-08-07 ENCOUNTER — Other Ambulatory Visit: Payer: Self-pay | Admitting: *Deleted

## 2010-08-07 DIAGNOSIS — E785 Hyperlipidemia, unspecified: Secondary | ICD-10-CM

## 2010-08-11 ENCOUNTER — Other Ambulatory Visit (INDEPENDENT_AMBULATORY_CARE_PROVIDER_SITE_OTHER): Payer: Medicare Other | Admitting: *Deleted

## 2010-08-11 ENCOUNTER — Other Ambulatory Visit: Payer: Medicare Other | Admitting: *Deleted

## 2010-08-11 DIAGNOSIS — E785 Hyperlipidemia, unspecified: Secondary | ICD-10-CM

## 2010-08-11 LAB — HEPATIC FUNCTION PANEL
ALT: 17 U/L (ref 0–35)
AST: 23 U/L (ref 0–37)
Albumin: 3.8 g/dL (ref 3.5–5.2)
Alkaline Phosphatase: 56 U/L (ref 39–117)
Bilirubin, Direct: 0.1 mg/dL (ref 0.0–0.3)
Total Bilirubin: 0.5 mg/dL (ref 0.3–1.2)
Total Protein: 6.7 g/dL (ref 6.0–8.3)

## 2010-08-11 LAB — BASIC METABOLIC PANEL
BUN: 27 mg/dL — ABNORMAL HIGH (ref 6–23)
CO2: 25 mEq/L (ref 19–32)
Calcium: 9.5 mg/dL (ref 8.4–10.5)
Chloride: 107 mEq/L (ref 96–112)
Creatinine, Ser: 0.7 mg/dL (ref 0.4–1.2)
GFR: 88.07 mL/min (ref >60.00)
Glucose, Bld: 101 mg/dL — ABNORMAL HIGH (ref 70–99)
Potassium: 4.3 mEq/L (ref 3.5–5.1)
Sodium: 143 mEq/L (ref 135–145)

## 2010-08-11 LAB — LIPID PANEL
Cholesterol: 198 mg/dL (ref 0–200)
HDL: 81.2 mg/dL (ref >39.00)
LDL Cholesterol: 105 mg/dL — ABNORMAL HIGH (ref 0–99)
Total CHOL/HDL Ratio: 2
Triglycerides: 59 mg/dL (ref 0.0–149.0)
VLDL: 11.8 mg/dL (ref 0.0–40.0)

## 2010-08-12 ENCOUNTER — Encounter: Payer: Self-pay | Admitting: Pulmonary Disease

## 2010-08-12 ENCOUNTER — Ambulatory Visit (INDEPENDENT_AMBULATORY_CARE_PROVIDER_SITE_OTHER): Payer: Medicare Other | Admitting: Pulmonary Disease

## 2010-08-12 ENCOUNTER — Encounter: Payer: Self-pay | Admitting: Cardiology

## 2010-08-12 VITALS — BP 112/70 | HR 72 | Temp 98.0°F | Ht 65.0 in | Wt 120.6 lb

## 2010-08-12 DIAGNOSIS — J479 Bronchiectasis, uncomplicated: Secondary | ICD-10-CM

## 2010-08-12 NOTE — Assessment & Plan Note (Addendum)
The pt is doing well from a bronchiectasis standpoint.  She has not had any flares, and is basically asymptomatic currently from a pulmonary perspective.  Will check on her again in one year, but she is to call if develops symptoms.

## 2010-08-12 NOTE — Patient Instructions (Signed)
Continue to stay active Will see you back one more time in a year, but call if issues with cough, congestion, or weight loss.

## 2010-08-12 NOTE — Progress Notes (Signed)
  Subjective:    Patient ID: Melinda Edwards, female    DOB: 1926-12-07, 77 y.o.   MRN: [id number]  HPI The pt comes in today for f/u of her known bronchiectasis.  She has had no flares since her last visit, and denies cough, congestion, or purulent mucus.  She has had no sob or weight loss/anorexia.  She feels that she is at her usual baseline.    Review of Systems  Constitutional: Negative for fever and unexpected weight change.  HENT: Positive for trouble swallowing. Negative for ear pain, nosebleeds, congestion, sore throat, rhinorrhea, sneezing, dental problem, postnasal drip and sinus pressure.   Eyes: Positive for itching. Negative for redness.  Respiratory: Negative for cough, chest tightness, shortness of breath and wheezing.   Cardiovascular: Negative for palpitations and leg swelling.  Gastrointestinal: Negative for nausea and vomiting.  Genitourinary: Negative for dysuria.  Musculoskeletal: Negative for joint swelling.  Skin: Negative for rash.  Neurological: Negative for headaches.  Hematological: Bruises/bleeds easily.  Psychiatric/Behavioral: Negative for dysphoric mood. The patient is not nervous/anxious.        Objective:   Physical Exam Thin female in nad Chest fairly clear, no crackles or pops noted. Cor with rrr LE without edema, no cyanosis Alert ,oriented, moves all 4        Assessment & Plan:

## 2010-08-13 ENCOUNTER — Ambulatory Visit (INDEPENDENT_AMBULATORY_CARE_PROVIDER_SITE_OTHER): Payer: Medicare Other | Admitting: Cardiology

## 2010-08-13 ENCOUNTER — Encounter: Payer: Self-pay | Admitting: Cardiology

## 2010-08-13 DIAGNOSIS — R002 Palpitations: Secondary | ICD-10-CM | POA: Insufficient documentation

## 2010-08-13 DIAGNOSIS — I119 Hypertensive heart disease without heart failure: Secondary | ICD-10-CM

## 2010-08-13 DIAGNOSIS — E785 Hyperlipidemia, unspecified: Secondary | ICD-10-CM

## 2010-08-13 NOTE — Assessment & Plan Note (Signed)
The patient has a past history of essential hypertension.  Her blood pressure is being controlled with diet and exercise.  She's not having any dizzy spells or syncope or headaches.  His not expressing any exertional chest pain or dyspnea.  She walks daily for exercise.

## 2010-08-13 NOTE — Assessment & Plan Note (Signed)
The patient has a history of hyperlipidemia.  She is on Lipitor 20 mg daily.  She has not been experiencing any adverse side effects from the Lipitor such as myalgias.  Her response to the Lipitor is satisfactory.

## 2010-08-13 NOTE — Assessment & Plan Note (Signed)
Pressure remains on low dose Lanoxin 0.125 daily with good control and suppression of her palpitations

## 2010-08-13 NOTE — Progress Notes (Signed)
Melinda Edwards Date of Birth:  06-02-1926 [facility] / [facility] [address]   [address] [place], [place]  [address] [phone]           Fax   [phone]  HPI: This pleasant 77 year old woman is seen for a six-month followup office visit.  She has a past history of hyperlipidemia and a history of palpitations.  She's had a history of essential hypertension chronic to diet and exercise.  Since last visit she's had no new cardiac symptoms.  She continues to walk daily for exercise and also does floor exercises.  She avoids sweets and high cholesterol foods.  She does not drink a lot of water and she is troubled with leg cramps at night.  She is not however having any muscle soreness or myalgias.  Current Outpatient Prescriptions  Medication Sig Dispense Refill  . aspirin 81 MG tablet Take 81 mg by mouth every other day.       Marland Kitchen atorvastatin (LIPITOR) 20 MG tablet Take 20 mg by mouth daily.        . Calcium Carbonate-Vitamin D (CALTRATE 600+D) 600-400 MG-UNIT per tablet Take 1 tablet by mouth 2 (two) times daily.        . digoxin (LANOXIN) 0.125 MG tablet Take 125 mcg by mouth daily.        . Multiple Vitamin (MULTIVITAMIN) capsule Take 1 capsule by mouth daily.        . Omega-3 Fatty Acids (FISH OIL) 1000 MG CAPS Take 1 capsule by mouth 2 (two) times daily.          Allergies  Allergen Reactions  . Loratadine     Patient Active Problem List  Diagnoses  . Bronchiectasis without acute exacerbation  . Benign hypertensive heart disease without heart failure  . Hyperlipidemia  . Palpitations    History  Smoking status  . Former Smoker -- 1.0 packs/day  . Types: Cigarettes  . Quit date: 08/12/1995  Smokeless tobacco  . Not on file    History  Alcohol Use No    Family History  Problem Relation Age of Onset  . Heart disease    . Heart attack Brother     x 3  . Heart failure Mother     chf    Review of Systems: The patient denies any heat or cold  intolerance.  No weight gain or weight loss.  The patient denies headaches or blurry vision.  There is no cough or sputum production.  The patient denies dizziness.  There is no hematuria or hematochezia.  The patient denies any muscle aches or arthritis.  The patient denies any rash.  The patient denies frequent falling or instability.  There is no history of depression or anxiety.  All other systems were reviewed and are negative.   Physical Exam: Filed Vitals:   08/13/10 0938  BP: 130/70  Pulse: 72   The general appearance reveals a well-developed well-nourished woman in no distress.Pupils equal and reactive.   Extraocular Movements are full.  There is no scleral icterus.  The mouth and pharynx are normal.  The neck is supple.  The carotids reveal no bruits.  The jugular venous pressure is normal.  The thyroid is not enlarged.  There is no lymphadenopathy.The chest is clear to percussion and auscultation. There are no rales or rhonchi. Expansion of the chest is symmetrical.The precordium is quiet.  The first heart sound is normal.  The second heart sound is physiologically  split.  There is no murmur gallop rub or click.  There is no abnormal lift or heaveThe abdomen is soft and nontender. Bowel sounds are normal. The liver and spleen are not enlarged. There Are no abdominal masses. There are no bruits.The pedal pulses are good.  There is no phlebitis or edema.  There is no cyanosis or clubbing.Strength is normal and symmetrical in all extremities.  There is no lateralizing weakness.  There are no sensory deficits.   Assessment / Plan: She's to continue her same medication.  Try to drink more water.  This may help with the nocturnal leg cramps.  Recheck 6 months

## 2010-12-27 ENCOUNTER — Other Ambulatory Visit: Payer: Self-pay | Admitting: Cardiology

## 2010-12-28 NOTE — Telephone Encounter (Signed)
Refilled lipitor 

## 2011-04-06 ENCOUNTER — Telehealth: Payer: Self-pay | Admitting: Cardiology

## 2011-04-06 DIAGNOSIS — E7800 Pure hypercholesterolemia, unspecified: Secondary | ICD-10-CM

## 2011-04-06 NOTE — Telephone Encounter (Signed)
Scheduled lab appt and ov with dr.brackbill. Advised her of new location.

## 2011-04-06 NOTE — Telephone Encounter (Addendum)
PT CALLING FOR FU APPT WITH [facility], APPT 05-06-11, WANTS TO KNOW IF SHE NEEDS BLOOD WORK?

## 2011-04-12 ENCOUNTER — Other Ambulatory Visit (INDEPENDENT_AMBULATORY_CARE_PROVIDER_SITE_OTHER): Payer: Medicare Other | Admitting: *Deleted

## 2011-04-12 DIAGNOSIS — E7800 Pure hypercholesterolemia, unspecified: Secondary | ICD-10-CM

## 2011-04-12 LAB — HEPATIC FUNCTION PANEL
ALT: 20 U/L (ref 0–35)
AST: 22 U/L (ref 0–37)
Albumin: 3.8 g/dL (ref 3.5–5.2)
Alkaline Phosphatase: 67 U/L (ref 39–117)
Bilirubin, Direct: 0.1 mg/dL (ref 0.0–0.3)
Total Bilirubin: 0.6 mg/dL (ref 0.3–1.2)
Total Protein: 6.9 g/dL (ref 6.0–8.3)

## 2011-04-12 LAB — LIPID PANEL
Cholesterol: 182 mg/dL (ref 0–200)
HDL: 73 mg/dL (ref >39.00)
LDL Cholesterol: 94 mg/dL (ref 0–99)
Total CHOL/HDL Ratio: 2
Triglycerides: 77 mg/dL (ref 0.0–149.0)
VLDL: 15.4 mg/dL (ref 0.0–40.0)

## 2011-04-12 LAB — BASIC METABOLIC PANEL
BUN: 22 mg/dL (ref 6–23)
CO2: 28 mEq/L (ref 19–32)
Calcium: 9.1 mg/dL (ref 8.4–10.5)
Chloride: 108 mEq/L (ref 96–112)
Creatinine, Ser: 0.8 mg/dL (ref 0.4–1.2)
GFR: 72.89 mL/min (ref >60.00)
Glucose, Bld: 96 mg/dL (ref 70–99)
Potassium: 3.9 mEq/L (ref 3.5–5.1)
Sodium: 142 mEq/L (ref 135–145)

## 2011-04-13 ENCOUNTER — Encounter: Payer: Self-pay | Admitting: Cardiology

## 2011-04-13 ENCOUNTER — Ambulatory Visit (INDEPENDENT_AMBULATORY_CARE_PROVIDER_SITE_OTHER): Payer: Medicare Other | Admitting: Cardiology

## 2011-04-13 VITALS — BP 134/74 | HR 70 | Wt 123.0 lb

## 2011-04-13 DIAGNOSIS — I350 Nonrheumatic aortic (valve) stenosis: Secondary | ICD-10-CM

## 2011-04-13 DIAGNOSIS — E7800 Pure hypercholesterolemia, unspecified: Secondary | ICD-10-CM

## 2011-04-13 DIAGNOSIS — I359 Nonrheumatic aortic valve disorder, unspecified: Secondary | ICD-10-CM

## 2011-04-13 DIAGNOSIS — I119 Hypertensive heart disease without heart failure: Secondary | ICD-10-CM

## 2011-04-13 DIAGNOSIS — E785 Hyperlipidemia, unspecified: Secondary | ICD-10-CM

## 2011-04-13 HISTORY — DX: Nonrheumatic aortic (valve) stenosis: I35.0

## 2011-04-13 NOTE — Patient Instructions (Signed)
Your physician recommends that you continue on your current medications as directed. Please refer to the Current Medication list given to you today.  Your physician wants you to follow-up in: 6 month. You will receive a reminder letter in the mail two months in advance. If you don't receive a letter, please call our office to schedule the follow-up appointment.  

## 2011-04-13 NOTE — Assessment & Plan Note (Signed)
Her lipids are excellent.  We reviewed her blood work together today.  She is not having any side effects from the statin therapy.

## 2011-04-13 NOTE — Assessment & Plan Note (Signed)
No symptoms referable to her aortic valve disease.  No evidence of CHF dizziness syncope or angina pectoris

## 2011-04-13 NOTE — Progress Notes (Signed)
Genoveva Ill Memmott Date of Birth:  06-Sep-1926 [facility] [address] [place], [place]  [address] [phone]  Fax   [phone]  HPI: This pleasant 78 year old woman is seen for a six-month followup office visit.  She has a history of hyperlipidemia and a history of palpitations and a history of essential hypertension.  She has moved to [place].  She is very pleased with her situation there.  They were able to bring their dog with them. The patient has not been experiencing any new cardiac symptoms.  She walks every day for exercise.  She has not been experiencing any chest pain or dyspnea.  No dizziness or syncope.  Current Outpatient Prescriptions  Medication Sig Dispense Refill  . aspirin 81 MG tablet Take 81 mg by mouth every other day.       Marland Kitchen atorvastatin (LIPITOR) 10 MG tablet TAKE 1 TABLET DAILY  90 tablet  3  . atorvastatin (LIPITOR) 20 MG tablet Take 20 mg by mouth daily.        . Calcium Carbonate-Vitamin D (CALTRATE 600+D) 600-400 MG-UNIT per tablet Take 1 tablet by mouth 2 (two) times daily.        . digoxin (LANOXIN) 0.125 MG tablet Take 125 mcg by mouth daily.        . Multiple Vitamin (MULTIVITAMIN) capsule Take 1 capsule by mouth daily.        . Omega-3 Fatty Acids (FISH OIL) 1000 MG CAPS Take 1 capsule by mouth 2 (two) times daily.          Allergies  Allergen Reactions  . Loratadine     Patient Active Problem List  Diagnoses  . Bronchiectasis without acute exacerbation  . Benign hypertensive heart disease without heart failure  . Hyperlipidemia  . Palpitations    History  Smoking status  . Former Smoker -- 1.0 packs/day  . Types: Cigarettes  . Quit date: 08/12/1995  Smokeless tobacco  . Not on file    History  Alcohol Use No    Family History  Problem Relation Age of Onset  . Heart disease    . Heart attack Brother     x 3  . Heart failure Mother     chf    Review of Systems: The patient denies  any heat or cold intolerance.  No weight gain or weight loss.  The patient denies headaches or blurry vision.  There is no cough or sputum production.  The patient denies dizziness.  There is no hematuria or hematochezia.  The patient denies any muscle aches or arthritis.  The patient denies any rash.  The patient denies frequent falling or instability.  There is no history of depression or anxiety.  All other systems were reviewed and are negative.   Physical Exam: Filed Vitals:   04/13/11 1032  BP: 134/74  Pulse: 70   the general appearance reveals a well-developed well-nourished woman in no distress.Pupils equal and reactive.   Extraocular Movements are full.  There is no scleral icterus.  The mouth and pharynx are normal.  The neck is supple.  The carotids reveal no bruits.  The jugular venous pressure is normal.  The thyroid is not enlarged.  There is no adenopathy.  The chest is clear to percussion and auscultation. There are no rales or rhonchi. Expansion of the chest is symmetrical.  Is a soft systolic ejection murmur at the base.  No gallop rub or click The abdomen is soft and  nontender. Bowel sounds are normal. The liver and spleen are not enlarged. There Are no abdominal masses. There are no bruits.  The pedal pulses are good.  There is no phlebitis or edema.  There is no cyanosis or clubbing. Strength is normal and symmetrical in all extremities.  There is no lateralizing weakness.  There are no sensory deficits.  The skin is warm and dry.  There is no rash.     Assessment / Plan: Continue same medication.  Recheck in 6 months for office visit EKG and fasting lab work

## 2011-04-13 NOTE — Assessment & Plan Note (Signed)
Blood pressure has been remaining stable.  No headaches or dizziness. 

## 2011-05-07 ENCOUNTER — Other Ambulatory Visit: Payer: Self-pay | Admitting: *Deleted

## 2011-05-07 MED ORDER — DIGOXIN 125 MCG PO TABS: 125.0000 ug | ORAL_TABLET | Freq: Every day | ORAL | Status: DC

## 2011-05-07 NOTE — Telephone Encounter (Signed)
Refilled lanoxin 

## 2011-05-10 ENCOUNTER — Other Ambulatory Visit (HOSPITAL_COMMUNITY): Payer: Self-pay | Admitting: Family Medicine

## 2011-05-10 DIAGNOSIS — Z1231 Encounter for screening mammogram for malignant neoplasm of breast: Secondary | ICD-10-CM

## 2011-06-09 ENCOUNTER — Ambulatory Visit (HOSPITAL_COMMUNITY)
Admission: RE | Admit: 2011-06-09 | Discharge: 2011-06-09 | Disposition: A | Discharge status: Home / Self Care | Payer: Medicare Other | Source: Ambulatory Visit | Attending: Family Medicine | Admitting: Family Medicine

## 2011-06-09 DIAGNOSIS — Z1231 Encounter for screening mammogram for malignant neoplasm of breast: Secondary | ICD-10-CM | POA: Insufficient documentation

## 2011-08-12 ENCOUNTER — Encounter: Payer: Self-pay | Admitting: Pulmonary Disease

## 2011-08-12 ENCOUNTER — Ambulatory Visit (INDEPENDENT_AMBULATORY_CARE_PROVIDER_SITE_OTHER): Payer: Medicare Other | Admitting: Pulmonary Disease

## 2011-08-12 VITALS — BP 116/68 | HR 90 | Temp 97.9°F | Ht 64.5 in | Wt 124.2 lb

## 2011-08-12 DIAGNOSIS — J479 Bronchiectasis, uncomplicated: Secondary | ICD-10-CM

## 2011-08-12 NOTE — Patient Instructions (Signed)
Can try zyrtec 10mg  over the counter at bedtime to see if it helps allergy symptoms. If doing well, followup with me in one year.   Please call if you develop a significant "chest cold".

## 2011-08-12 NOTE — Progress Notes (Signed)
  Subjective:    Patient ID: Melinda Edwards, female    DOB: [birth year], 78 y.o.   MRN: [id number]  HPI The patient comes in today for followup of her known bronchiectasis.  She has done very well since the last visit, and denies any significant cough or chest infection.  Her only complaint is that she sometimes has cough when she goes out walking the dog, but clearly is having postnasal drip and allergy symptoms.  She feels that her exertional tolerance is quite good.   Review of Systems  Constitutional: Negative.  Negative for fever and unexpected weight change.  HENT: Positive for sneezing. Negative for ear pain, nosebleeds, congestion, sore throat, rhinorrhea, trouble swallowing, dental problem, postnasal drip and sinus pressure.   Eyes: Negative.  Negative for redness and itching.  Respiratory: Positive for cough. Negative for chest tightness, shortness of breath and wheezing.   Cardiovascular: Negative.  Negative for palpitations and leg swelling.  Gastrointestinal: Negative.  Negative for nausea and vomiting.  Genitourinary: Negative.  Negative for dysuria.  Musculoskeletal: Negative.  Negative for joint swelling.  Skin: Negative.  Negative for rash.  Neurological: Negative.  Negative for headaches.  Hematological: Negative.  Does not bruise/bleed easily.  Psychiatric/Behavioral: Negative.  Negative for dysphoric mood. The patient is not nervous/anxious.        Objective:   Physical Exam Thin female in nad Nose without purulence or discharge Chest fairly clear to auscultation.  No wheezing or rhonchi Cor with rrr LE without edema, no cyanosis Alert and oriented, moves all 4.        Assessment & Plan:

## 2011-09-04 ENCOUNTER — Other Ambulatory Visit: Payer: Self-pay | Admitting: Cardiology

## 2011-09-06 NOTE — Telephone Encounter (Signed)
Refilled atorvastatin

## 2012-01-28 ENCOUNTER — Other Ambulatory Visit (INDEPENDENT_AMBULATORY_CARE_PROVIDER_SITE_OTHER): Payer: Medicare Other

## 2012-01-28 DIAGNOSIS — E7800 Pure hypercholesterolemia, unspecified: Secondary | ICD-10-CM

## 2012-01-28 DIAGNOSIS — I119 Hypertensive heart disease without heart failure: Secondary | ICD-10-CM

## 2012-01-28 LAB — BASIC METABOLIC PANEL
BUN: 18 mg/dL (ref 6–23)
CO2: 29 mEq/L (ref 19–32)
Calcium: 9.3 mg/dL (ref 8.4–10.5)
Chloride: 108 mEq/L (ref 96–112)
Creatinine, Ser: 0.7 mg/dL (ref 0.4–1.2)
GFR: 84.86 mL/min (ref >60.00)
Glucose, Bld: 104 mg/dL — ABNORMAL HIGH (ref 70–99)
Potassium: 4.3 mEq/L (ref 3.5–5.1)
Sodium: 142 mEq/L (ref 135–145)

## 2012-01-28 LAB — HEPATIC FUNCTION PANEL
ALT: 28 U/L (ref 0–35)
AST: 36 U/L (ref 0–37)
Albumin: 4.1 g/dL (ref 3.5–5.2)
Alkaline Phosphatase: 75 U/L (ref 39–117)
Bilirubin, Direct: 0.1 mg/dL (ref 0.0–0.3)
Total Bilirubin: 0.6 mg/dL (ref 0.3–1.2)
Total Protein: 7.4 g/dL (ref 6.0–8.3)

## 2012-01-28 LAB — LDL CHOLESTEROL, DIRECT: Direct LDL: 146.5 mg/dL

## 2012-01-28 LAB — LIPID PANEL
Cholesterol: 236 mg/dL — ABNORMAL HIGH (ref 0–200)
HDL: 59 mg/dL (ref >39.00)
Total CHOL/HDL Ratio: 4
Triglycerides: 167 mg/dL — ABNORMAL HIGH (ref 0.0–149.0)
VLDL: 33.4 mg/dL (ref 0.0–40.0)

## 2012-01-28 NOTE — Progress Notes (Signed)
Quick Note:  Please make copy of labs for patient visit. ______ 

## 2012-02-01 ENCOUNTER — Ambulatory Visit (INDEPENDENT_AMBULATORY_CARE_PROVIDER_SITE_OTHER): Payer: Medicare Other | Admitting: Cardiology

## 2012-02-01 ENCOUNTER — Encounter: Payer: Self-pay | Admitting: Cardiology

## 2012-02-01 VITALS — BP 150/78 | HR 67 | Ht 64.0 in | Wt 129.0 lb

## 2012-02-01 DIAGNOSIS — E7800 Pure hypercholesterolemia, unspecified: Secondary | ICD-10-CM

## 2012-02-01 DIAGNOSIS — R002 Palpitations: Secondary | ICD-10-CM

## 2012-02-01 DIAGNOSIS — E785 Hyperlipidemia, unspecified: Secondary | ICD-10-CM

## 2012-02-01 DIAGNOSIS — I359 Nonrheumatic aortic valve disorder, unspecified: Secondary | ICD-10-CM

## 2012-02-01 DIAGNOSIS — I119 Hypertensive heart disease without heart failure: Secondary | ICD-10-CM

## 2012-02-01 DIAGNOSIS — I350 Nonrheumatic aortic (valve) stenosis: Secondary | ICD-10-CM

## 2012-02-01 NOTE — Assessment & Plan Note (Signed)
Patient has not been having any headaches or dizzy spells. 

## 2012-02-01 NOTE — Patient Instructions (Addendum)
Your physician recommends that you continue on your current medications as directed. Please refer to the Current Medication list given to you today.  Your physician wants you to follow-up in: 6 months. You will receive a reminder letter in the mail two months in advance. If you don't receive a letter, please call our office to schedule the follow-up appointment.  

## 2012-02-01 NOTE — Assessment & Plan Note (Signed)
The patient is not having any symptoms referable to her mild aortic stenosis.  Her electrocardiogram today shows no LVH or strain.

## 2012-02-01 NOTE — Assessment & Plan Note (Signed)
We reviewed her recent labs which are not as good as they were on the previous visit.  Apparently her diet is not as strict at the [facility].  She will work harder to bring the cholesterol down

## 2012-02-01 NOTE — Progress Notes (Signed)
Melinda Edwards Date of Birth:  [birth year] [facility] [address] [place], [place]  [address] [phone]  Fax   [phone]  HPI: This pleasant 78 year old woman is seen for a six-month followup office visit. She has a history of hyperlipidemia and a history of palpitations and a history of essential hypertension. She has moved to [place]. She is very pleased with her situation there. They were able to bring their dog with them.  The patient has not been experiencing any new cardiac symptoms. She walks every day for exercise. She has not been experiencing any chest pain or dyspnea. No dizziness or syncope.    Current Outpatient Prescriptions  Medication Sig Dispense Refill  . atorvastatin (LIPITOR) 10 MG tablet TAKE 1 TABLET DAILY  90 tablet  3  . Calcium Carbonate-Vitamin D (CALTRATE 600+D) 600-400 MG-UNIT per tablet Take 1 tablet by mouth 2 (two) times daily.        . digoxin (LANOXIN) 0.125 MG tablet Take 1 tablet (125 mcg total) by mouth daily.  90 tablet  3  . ibuprofen (ADVIL,MOTRIN) 200 MG tablet Take 200 mg by mouth every 6 (six) hours as needed.      . Multiple Vitamin (MULTIVITAMIN) capsule Take 1 capsule by mouth daily.        . Omega-3 Fatty Acids (FISH OIL) 1000 MG CAPS Take 1 capsule by mouth 2 (two) times daily.        Marland Kitchen aspirin 81 MG tablet Take 81 mg by mouth every other day.         Allergies  Allergen Reactions  . Loratadine     Patient Active Problem List  Diagnosis  . Bronchiectasis without acute exacerbation  . Benign hypertensive heart disease without heart failure  . Hyperlipidemia  . Palpitations  . Aortic stenosis, mild    History  Smoking status  . Former Smoker -- 1.0 packs/day  . Types: Cigarettes  . Quit date: 08/12/1995  Smokeless tobacco  . Not on file    History  Alcohol Use No    Family History  Problem Relation Age of Onset  . Heart disease    . Heart attack Brother     x 3  . Heart failure  Mother     chf    Review of Systems: The patient denies any heat or cold intolerance.  No weight gain or weight loss.  The patient denies headaches or blurry vision.  There is no cough or sputum production.  The patient denies dizziness.  There is no hematuria or hematochezia.  The patient denies any muscle aches or arthritis.  The patient denies any rash.  The patient denies frequent falling or instability.  There is no history of depression or anxiety.  All other systems were reviewed and are negative.   Physical Exam: Filed Vitals:   02/01/12 0952  BP: 150/78  Pulse: 67   the general appearance reveals a well-developed elderly woman in no distress.The head and neck exam reveals pupils equal and reactive.  Extraocular movements are full.  There is no scleral icterus.  The mouth and pharynx are normal.  The neck is supple.  The carotids reveal no bruits.  The jugular venous pressure is normal.  The  thyroid is not enlarged.  There is no lymphadenopathy.  The chest is clear to percussion and auscultation.  There are no rales or rhonchi.  Expansion of the chest is symmetrical.  The precordium is quiet.  The first heart  sound is normal.  The second heart sound is physiologically split.  There is a soft systolic murmur at the base.  There is no abnormal lift or heave.  The abdomen is soft and nontender.  The bowel sounds are normal.  The liver and spleen are not enlarged.  There are no abdominal masses.  There are no abdominal bruits.  Extremities reveal good pedal pulses.  There is no phlebitis or edema.  There is no cyanosis or clubbing.  Strength is normal and symmetrical in all extremities.  There is no lateralizing weakness.  There are no sensory deficits.  The skin is warm and dry.  There is no rash.  EKG shows normal sinus rhythm and minor nonspecific ST abnormality    Assessment / Plan: Continue on same medication.  Recheck in 6 months for followup office visit lipid panel hepatic function  panel and basal metabolic panel.  We will want to send a copy of her recent labs to Dr. Maurice Small.

## 2012-05-16 ENCOUNTER — Other Ambulatory Visit (HOSPITAL_COMMUNITY): Payer: Self-pay | Admitting: Family Medicine

## 2012-05-16 DIAGNOSIS — Z1231 Encounter for screening mammogram for malignant neoplasm of breast: Secondary | ICD-10-CM

## 2012-06-07 ENCOUNTER — Other Ambulatory Visit: Payer: Self-pay | Admitting: *Deleted

## 2012-06-07 MED ORDER — DIGOXIN 125 MCG PO TABS: 125.0000 ug | ORAL_TABLET | Freq: Every day | ORAL | Status: DC

## 2012-06-07 MED ORDER — ATORVASTATIN CALCIUM 10 MG PO TABS: 10.0000 mg | ORAL_TABLET | Freq: Every day | ORAL | Status: DC

## 2012-06-09 ENCOUNTER — Ambulatory Visit (HOSPITAL_COMMUNITY)
Admission: RE | Admit: 2012-06-09 | Discharge: 2012-06-09 | Disposition: A | Discharge status: Home / Self Care | Payer: Medicare Other | Source: Ambulatory Visit | Attending: Family Medicine | Admitting: Family Medicine

## 2012-06-09 DIAGNOSIS — Z1231 Encounter for screening mammogram for malignant neoplasm of breast: Secondary | ICD-10-CM | POA: Insufficient documentation

## 2012-08-01 ENCOUNTER — Other Ambulatory Visit (INDEPENDENT_AMBULATORY_CARE_PROVIDER_SITE_OTHER): Payer: Medicare Other

## 2012-08-01 DIAGNOSIS — I119 Hypertensive heart disease without heart failure: Secondary | ICD-10-CM

## 2012-08-01 DIAGNOSIS — E7800 Pure hypercholesterolemia, unspecified: Secondary | ICD-10-CM

## 2012-08-01 LAB — LIPID PANEL
Cholesterol: 195 mg/dL (ref 0–200)
HDL: 56.9 mg/dL (ref >39.00)
LDL Cholesterol: 114 mg/dL — ABNORMAL HIGH (ref 0–99)
Total CHOL/HDL Ratio: 3
Triglycerides: 122 mg/dL (ref 0.0–149.0)
VLDL: 24.4 mg/dL (ref 0.0–40.0)

## 2012-08-01 LAB — HEPATIC FUNCTION PANEL
ALT: 17 U/L (ref 0–35)
AST: 23 U/L (ref 0–37)
Albumin: 3.7 g/dL (ref 3.5–5.2)
Alkaline Phosphatase: 73 U/L (ref 39–117)
Bilirubin, Direct: 0 mg/dL (ref 0.0–0.3)
Total Bilirubin: 0.6 mg/dL (ref 0.3–1.2)
Total Protein: 7.1 g/dL (ref 6.0–8.3)

## 2012-08-01 LAB — BASIC METABOLIC PANEL
BUN: 19 mg/dL (ref 6–23)
CO2: 26 mEq/L (ref 19–32)
Calcium: 9.1 mg/dL (ref 8.4–10.5)
Chloride: 107 mEq/L (ref 96–112)
Creatinine, Ser: 0.7 mg/dL (ref 0.4–1.2)
GFR: 89.15 mL/min (ref >60.00)
Glucose, Bld: 82 mg/dL (ref 70–99)
Potassium: 3.8 mEq/L (ref 3.5–5.1)
Sodium: 140 mEq/L (ref 135–145)

## 2012-08-08 ENCOUNTER — Encounter: Payer: Self-pay | Admitting: Cardiology

## 2012-08-08 ENCOUNTER — Encounter: Payer: Self-pay | Admitting: Pulmonary Disease

## 2012-08-08 ENCOUNTER — Ambulatory Visit (INDEPENDENT_AMBULATORY_CARE_PROVIDER_SITE_OTHER): Payer: Medicare Other | Admitting: Pulmonary Disease

## 2012-08-08 ENCOUNTER — Ambulatory Visit (INDEPENDENT_AMBULATORY_CARE_PROVIDER_SITE_OTHER): Payer: Medicare Other | Admitting: Cardiology

## 2012-08-08 VITALS — BP 122/62 | HR 78 | Temp 96.7°F

## 2012-08-08 VITALS — BP 120/64 | HR 80 | Ht 64.5 in | Wt 127.0 lb

## 2012-08-08 DIAGNOSIS — E785 Hyperlipidemia, unspecified: Secondary | ICD-10-CM

## 2012-08-08 DIAGNOSIS — E7800 Pure hypercholesterolemia, unspecified: Secondary | ICD-10-CM

## 2012-08-08 DIAGNOSIS — G4762 Sleep related leg cramps: Secondary | ICD-10-CM

## 2012-08-08 DIAGNOSIS — I119 Hypertensive heart disease without heart failure: Secondary | ICD-10-CM

## 2012-08-08 DIAGNOSIS — J479 Bronchiectasis, uncomplicated: Secondary | ICD-10-CM

## 2012-08-08 MED ORDER — POTASSIUM CHLORIDE CRYS ER 20 MEQ PO TBCR: 20.0000 meq | EXTENDED_RELEASE_TABLET | Freq: Every day | ORAL | Status: DC

## 2012-08-08 NOTE — Progress Notes (Signed)
  Subjective:    Patient ID: Melinda Edwards, female    DOB: 16-Nov-1926, 79 y.o.   MRN: [id number]  HPI Patient comes in today for followup of her known bronchiectasis.  She is doing very well since the last visit, with no significant cough, mucus production, or chest congestion.  She is eating well, and is not losing weight.  She feels that her exertional tolerance is at baseline.   Review of Systems  Constitutional: Negative for fever and unexpected weight change.  HENT: Positive for rhinorrhea. Negative for ear pain, nosebleeds, congestion, sore throat, sneezing, trouble swallowing, dental problem, postnasal drip and sinus pressure.   Eyes: Negative for redness and itching.  Respiratory: Negative for cough, chest tightness, shortness of breath and wheezing.   Cardiovascular: Negative for palpitations and leg swelling.  Gastrointestinal: Negative for nausea and vomiting.  Genitourinary: Negative for dysuria.  Musculoskeletal: Negative for joint swelling.  Skin: Negative for rash.  Neurological: Negative for headaches.  Hematological: Does not bruise/bleed easily.  Psychiatric/Behavioral: Negative for dysphoric mood. The patient is not nervous/anxious.        Objective:   Physical Exam Thin female in no acute distress Nose without purulence or discharge noted Neck without lymphadenopathy or thyromegaly Chest with clear breath sounds bilaterally, no wheezes or crackles Cardiac exam with regular rate and rhythm Lower extremities without edema, no cyanosis Alert and oriented, moves all 4 extremities.       Assessment & Plan:

## 2012-08-08 NOTE — Patient Instructions (Addendum)
START KDUR (POTASSIUM) 20 MEQ DAILY, RX SENT TO [facility]  Your physician wants you to follow-up in: 6 months with fasting labs (lp/bmet/hfp)  And EKG You will receive a reminder letter in the mail two months in advance. If you don't receive a letter, please call our office to schedule the follow-up appointment.

## 2012-08-08 NOTE — Assessment & Plan Note (Signed)
The patient is doing very well from a bronchiectasis standpoint, with no significant cough or congestion.  Her exertional tolerance is completely stable.  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  She will followup with me in one year, but will call if she begins to have constitutional symptoms or worsening pulmonary symptoms.

## 2012-08-08 NOTE — Assessment & Plan Note (Signed)
We will prescribe K. Dur 20 mEq one daily to help prevent nocturnal leg cramps

## 2012-08-08 NOTE — Patient Instructions (Addendum)
Stay active, and let us know if you develop worsening cough/mucus, breathing issues, weight loss, or loss of appetite. Will see you back in one year, and will check chest xray next visit. Can try zyrtec 10mg  at bedtime for your nasal symptoms.  If that does not work, can try chlorpheniramine 4mg  as directed.

## 2012-08-08 NOTE — Assessment & Plan Note (Signed)
No chest pain or shortness of breath or palpitations

## 2012-08-08 NOTE — Progress Notes (Signed)
Melinda Edwards Date of Birth:  Mar 30, 1926 [facility] [address] [place], [place]  [address] [phone]  Fax   [phone]  HPI: This pleasant 79 year old woman is seen for a six-month followup office visit. She has a history of hyperlipidemia and a history of palpitations and a history of essential hypertension. She has moved to [place]. She is very pleased with her situation there. They were able to bring their dog with them.  The patient has not been experiencing any new cardiac symptoms. She walks every day for exercise. She has not been experiencing any chest pain or dyspnea. No dizziness or syncope.  She has been experiencing some painful leg cramps at night.  She has taken some over-the-counter potassium tablets which seemed to help prevent these cramps.   Current Outpatient Prescriptions  Medication Sig Dispense Refill  . aspirin 81 MG tablet Take 81 mg by mouth every other day.       Marland Kitchen atorvastatin (LIPITOR) 10 MG tablet Take 1 tablet (10 mg total) by mouth daily.  90 tablet  3  . Calcium Carbonate-Vitamin D (CALTRATE 600+D) 600-400 MG-UNIT per tablet Take 1 tablet by mouth 2 (two) times daily.        . digoxin (LANOXIN) 0.125 MG tablet Take 1 tablet (125 mcg total) by mouth daily.  90 tablet  3  . Omega-3 Fatty Acids (FISH OIL) 1000 MG CAPS Take 1 capsule by mouth 2 (two) times daily.        . potassium chloride SA (K-DUR,KLOR-CON) 20 MEQ tablet Take 1 tablet (20 mEq total) by mouth daily.  90 tablet  3   No current facility-administered medications for this visit.    Allergies  Allergen Reactions  . Loratadine     Patient Active Problem List   Diagnosis Date Noted  . Benign hypertensive heart disease without heart failure 08/13/2010    Priority: High  . Hyperlipidemia 08/13/2010    Priority: Medium  . Nocturnal leg cramps 08/08/2012  . Aortic stenosis, mild 04/13/2011  . Palpitations 08/13/2010  . Bronchiectasis without acute  exacerbation 11/15/2007    History  Smoking status  . Former Smoker -- 1.00 packs/day  . Types: Cigarettes  . Quit date: 08/12/1995  Smokeless tobacco  . Not on file    History  Alcohol Use No    Family History  Problem Relation Age of Onset  . Heart disease    . Heart attack Brother     x 3  . Heart failure Mother     chf    Review of Systems: The patient denies any heat or cold intolerance.  No weight gain or weight loss.  The patient denies headaches or blurry vision.  There is no cough or sputum production.  The patient denies dizziness.  There is no hematuria or hematochezia.  The patient denies any muscle aches or arthritis.  The patient denies any rash.  The patient denies frequent falling or instability.  There is no history of depression or anxiety.  All other systems were reviewed and are negative.   Physical Exam: Filed Vitals:   08/08/12 1504  BP: 120/64  Pulse: 80   the general appearance reveals a well-developed well-nourished elderly woman in no distress.The head and neck exam reveals pupils equal and reactive.  Extraocular movements are full.  There is no scleral icterus.  The mouth and pharynx are normal.  The neck is supple.  The carotids reveal no bruits.  The jugular venous  pressure is normal.  The  thyroid is not enlarged.  There is no lymphadenopathy.  The chest is clear to percussion and auscultation.  There are no rales or rhonchi.  Expansion of the chest is symmetrical.  The precordium is quiet.  The first heart sound is normal.  The second heart sound is physiologically split.  There is no murmur gallop rub or click.  There is no abnormal lift or heave.  The abdomen is soft and nontender.  The bowel sounds are normal.  The liver and spleen are not enlarged.  There are no abdominal masses.  There are no abdominal bruits.  Extremities reveal good pedal pulses.  There is no phlebitis or edema.  There is no cyanosis or clubbing.  Strength is normal and  symmetrical in all extremities.  There is no lateralizing weakness.  There are no sensory deficits.  The skin is warm and dry.  There is no rash.      Assessment / Plan: Continue same medication.  Add K-Dur 20 milliequivalents daily.  Recheck 6 months for office visit EKG and fasting lab work

## 2012-08-08 NOTE — Assessment & Plan Note (Signed)
Lab work is satisfactory on current therapy

## 2013-02-05 ENCOUNTER — Encounter: Payer: Self-pay | Admitting: Cardiology

## 2013-02-05 ENCOUNTER — Ambulatory Visit (INDEPENDENT_AMBULATORY_CARE_PROVIDER_SITE_OTHER): Payer: Medicare Other | Admitting: Cardiology

## 2013-02-05 VITALS — BP 130/78 | HR 78 | Ht 64.0 in | Wt 127.0 lb

## 2013-02-05 DIAGNOSIS — E7800 Pure hypercholesterolemia, unspecified: Secondary | ICD-10-CM

## 2013-02-05 DIAGNOSIS — I119 Hypertensive heart disease without heart failure: Secondary | ICD-10-CM

## 2013-02-05 DIAGNOSIS — I350 Nonrheumatic aortic (valve) stenosis: Secondary | ICD-10-CM

## 2013-02-05 DIAGNOSIS — R002 Palpitations: Secondary | ICD-10-CM

## 2013-02-05 DIAGNOSIS — I359 Nonrheumatic aortic valve disorder, unspecified: Secondary | ICD-10-CM

## 2013-02-05 DIAGNOSIS — G4762 Sleep related leg cramps: Secondary | ICD-10-CM

## 2013-02-05 NOTE — Assessment & Plan Note (Signed)
The patient has not been having any chest pain.  She does have some exertional dyspnea which is chronic.  At her request we gave her a form for handicap sticker for her car today. She has not been having any dizzy spells or syncope

## 2013-02-05 NOTE — Patient Instructions (Signed)
Will obtain labs today and call you with the results (bmet)  Your physician recommends that you continue on your current medications as directed. Please refer to the Current Medication list given to you today.  Your physician wants you to follow-up in: 6 months with fasting labs (lp/bmet/hfp)  You will receive a reminder letter in the mail two months in advance. If you don't receive a letter, please call our office to schedule the follow-up appointment.

## 2013-02-05 NOTE — Assessment & Plan Note (Signed)
She is not having any symptoms from her mild aortic valve stenosis.  No angina pectoris or severe dyspnea or dizziness or syncope

## 2013-02-05 NOTE — Progress Notes (Signed)
Melinda Edwards Date of Birth:  1927/02/15 [address] [place], [place]  [address] [phone]  Fax   [phone]  HPI: This pleasant 79 year old woman is seen for a six-month followup office visit. She has a history of hyperlipidemia and a history of palpitations and a history of essential hypertension. She has moved to [place]. She is very pleased with her situation there. They were able to bring their dog with them.  The patient has not been experiencing any new cardiac symptoms. She walks every day for exercise. She has not been experiencing any chest pain or dyspnea. No dizziness or syncope.  She had been experiencing some painful leg cramps at night.  Her leg cramps have improved since starting K. Dur 20 mEq daily.   Current Outpatient Prescriptions  Medication Sig Dispense Refill  . aspirin 81 MG tablet Take 81 mg by mouth. Every so often      . atorvastatin (LIPITOR) 10 MG tablet Take 1 tablet (10 mg total) by mouth daily.  90 tablet  3  . Calcium Carbonate-Vitamin D (CALTRATE 600+D) 600-400 MG-UNIT per tablet Take 1 tablet by mouth 2 (two) times daily.        . digoxin (LANOXIN) 0.125 MG tablet Take 1 tablet (125 mcg total) by mouth daily.  90 tablet  3  . Omega-3 Fatty Acids (FISH OIL) 1000 MG CAPS Take 1 capsule by mouth 2 (two) times daily.        . potassium chloride SA (K-DUR,KLOR-CON) 20 MEQ tablet Take 1 tablet (20 mEq total) by mouth daily.  90 tablet  3   No current facility-administered medications for this visit.    Allergies  Allergen Reactions  . Loratadine     Patient Active Problem List   Diagnosis Date Noted  . Benign hypertensive heart disease without heart failure 08/13/2010    Priority: High  . Hyperlipidemia 08/13/2010    Priority: Medium  . Nocturnal leg cramps 08/08/2012  . Aortic stenosis, mild 04/13/2011  . Palpitations 08/13/2010  . Bronchiectasis without acute exacerbation 11/15/2007    History  Smoking status  .  Former Smoker -- 1.00 packs/day  . Types: Cigarettes  . Quit date: 08/12/1995  Smokeless tobacco  . Not on file    History  Alcohol Use No    Family History  Problem Relation Age of Onset  . Heart disease    . Heart attack Brother     x 3  . Heart failure Mother     chf    Review of Systems: The patient denies any heat or cold intolerance.  No weight gain or weight loss.  The patient denies headaches or blurry vision.  There is no cough or sputum production.  The patient denies dizziness.  There is no hematuria or hematochezia.  The patient denies any muscle aches or arthritis.  The patient denies any rash.  The patient denies frequent falling or instability.  There is no history of depression or anxiety.  All other systems were reviewed and are negative.   Physical Exam: Filed Vitals:   02/05/13 1551  BP: 130/78  Pulse: 78   the general appearance reveals a well-developed well-nourished elderly woman in no distress.The head and neck exam reveals pupils equal and reactive.  Extraocular movements are full.  There is no scleral icterus.  The mouth and pharynx are normal.  The neck is supple.  The carotids reveal no bruits.  The jugular venous pressure is normal.  The  thyroid is not enlarged.  There is no lymphadenopathy.  The chest is clear to percussion and auscultation.  There are no rales or rhonchi.  Expansion of the chest is symmetrical.  The precordium is quiet.  The first heart sound is normal.  The second heart sound is physiologically split.  There is no murmur gallop rub or click.  There is no abnormal lift or heave.  The abdomen is soft and nontender.  The bowel sounds are normal.  The liver and spleen are not enlarged.  There are no abdominal masses.  There are no abdominal bruits.  Extremities reveal good pedal pulses.  There is no phlebitis or edema.  There is no cyanosis or clubbing.  Strength is normal and symmetrical in all extremities.  There is no lateralizing weakness.   There are no sensory deficits.  The skin is warm and dry.  There is no rash.      Assessment / Plan: Continue same medication.  Recheck in 6 months for office visit lipid panel hepatic function panel and nasal metabolic panel.

## 2013-02-05 NOTE — Assessment & Plan Note (Signed)
Symptoms have resolved since starting daily K. Dur.  We are checking a followup basal metabolic panel today.

## 2013-02-05 NOTE — Assessment & Plan Note (Signed)
The patient has not been experiencing any palpitations recently.

## 2013-02-06 LAB — BASIC METABOLIC PANEL
BUN: 21 mg/dL (ref 6–23)
CO2: 29 mEq/L (ref 19–32)
Calcium: 9.2 mg/dL (ref 8.4–10.5)
Chloride: 104 mEq/L (ref 96–112)
Creatinine, Ser: 0.9 mg/dL (ref 0.4–1.2)
GFR: 67.66 mL/min (ref >60.00)
Glucose, Bld: 90 mg/dL (ref 70–99)
Potassium: 4.4 mEq/L (ref 3.5–5.1)
Sodium: 138 mEq/L (ref 135–145)

## 2013-02-06 NOTE — Progress Notes (Signed)
Quick Note:  Please report to patient. The recent labs are stable. Continue same medication and careful diet. ______ 

## 2013-02-09 ENCOUNTER — Telehealth: Payer: Self-pay | Admitting: *Deleted

## 2013-02-09 NOTE — Telephone Encounter (Signed)
Number does not seem to be working, mailed copy of labs

## 2013-02-09 NOTE — Telephone Encounter (Signed)
Message copied by Burnell Blanks on Fri Feb 09, 2013  4:42 PM ------      Message from: Cassell Clement      Created: Tue Feb 06, 2013  2:19 PM       Please report to patient.  The recent labs are stable. Continue same medication and careful diet. ------

## 2013-02-15 ENCOUNTER — Telehealth: Payer: Self-pay | Admitting: Cardiology

## 2013-02-15 NOTE — Telephone Encounter (Signed)
Walk In pt Form "Application for Handicapped Drivers Registration Plate" paper Dropped off give to [facility] Friday

## 2013-02-16 ENCOUNTER — Telehealth: Payer: Self-pay | Admitting: Cardiology

## 2013-02-16 NOTE — Telephone Encounter (Signed)
Advised patient will call after  Dr. Patty Sermons has signed

## 2013-02-16 NOTE — Telephone Encounter (Signed)
New message//   Pt states she left a form ([place] Dept of Motor Vehicle Placard [place])  at front office for the Dr. to complete.// She is simply calling to follow up// please assist

## 2013-05-11 ENCOUNTER — Other Ambulatory Visit (HOSPITAL_COMMUNITY): Payer: Self-pay | Admitting: Family Medicine

## 2013-05-11 DIAGNOSIS — Z1231 Encounter for screening mammogram for malignant neoplasm of breast: Secondary | ICD-10-CM

## 2013-06-12 ENCOUNTER — Ambulatory Visit (HOSPITAL_COMMUNITY)
Admission: RE | Admit: 2013-06-12 | Discharge: 2013-06-12 | Disposition: A | Discharge status: Home / Self Care | Payer: Medicare Other | Source: Ambulatory Visit | Attending: Family Medicine | Admitting: Family Medicine

## 2013-06-12 DIAGNOSIS — Z1231 Encounter for screening mammogram for malignant neoplasm of breast: Secondary | ICD-10-CM | POA: Insufficient documentation

## 2013-08-14 ENCOUNTER — Ambulatory Visit (INDEPENDENT_AMBULATORY_CARE_PROVIDER_SITE_OTHER): Payer: Medicare Other | Admitting: Pulmonary Disease

## 2013-08-14 ENCOUNTER — Encounter: Payer: Self-pay | Admitting: Pulmonary Disease

## 2013-08-14 ENCOUNTER — Ambulatory Visit (INDEPENDENT_AMBULATORY_CARE_PROVIDER_SITE_OTHER)
Admission: RE | Admit: 2013-08-14 | Discharge: 2013-08-14 | Disposition: A | Discharge status: Home / Self Care | Payer: Medicare Other | Source: Ambulatory Visit | Attending: Pulmonary Disease | Admitting: Pulmonary Disease

## 2013-08-14 VITALS — BP 116/70 | HR 77 | Temp 97.7°F | Ht 64.0 in | Wt 126.2 lb

## 2013-08-14 DIAGNOSIS — J479 Bronchiectasis, uncomplicated: Secondary | ICD-10-CM

## 2013-08-14 NOTE — Patient Instructions (Signed)
Will check chest xray today and call you with results. Please call us if you are having increased pulmonary symptoms, otherwise will see you back in one year

## 2013-08-14 NOTE — Progress Notes (Signed)
   Subjective:    Patient ID: Melinda Edwards, female    DOB: 22-Nov-1926, 80 y.o.   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 She has done very well since the last visit, with no acute exacerbations and no significant pulmonary symptoms. She is eating well, and has not been losing weight. She even avoided her usual respiratory infection.   Review of Systems  Constitutional: Negative for fever and unexpected weight change.  HENT: Positive for postnasal drip. Negative for congestion, dental problem, ear pain, nosebleeds, rhinorrhea, sinus pressure, sneezing, sore throat and trouble swallowing.   Eyes: Negative for redness and itching.  Respiratory: Negative for cough, chest tightness, shortness of breath and wheezing.   Cardiovascular: Negative for palpitations and leg swelling.  Gastrointestinal: Negative for nausea and vomiting.  Genitourinary: Negative for dysuria.  Musculoskeletal: Negative for joint swelling.  Skin: Negative for rash.  Neurological: Negative for headaches.  Hematological: Does not bruise/bleed easily.  Psychiatric/Behavioral: Negative for dysphoric mood. The patient is not nervous/anxious.        Objective:   Physical Exam Well-developed female in no acute distress Nose without purulence or discharge noted Neck without lymphadenopathy or thyromegaly Chest totally clear to auscultation, no crackles or wheezes Cardiac exam with regular rate and rhythm Lower extremities without edema, no cyanosis Alert and oriented, moves all 4 extremities.       Assessment & Plan:

## 2013-08-14 NOTE — Assessment & Plan Note (Signed)
The pt has done well over the last year from a bronchiectasis standpoint.  I suspect she has MAC colonization, but she is eating well and not losing weight.  She has not had any significant chest congestion or mucus.  Will check a cxr today, and see her back in one year.

## 2013-09-19 ENCOUNTER — Telehealth: Payer: Self-pay | Admitting: Cardiology

## 2013-09-19 NOTE — Telephone Encounter (Signed)
Spoke with patient and she has been having palpitations about once a week that last for a few seconds and wanted to see  Dr. Mare Ferrari. Scheduled appointment for her to be seen soon and advised to contact PCP regarding tremor. Patient verbalized understanding.

## 2013-09-19 NOTE — Telephone Encounter (Signed)
New message  Pt called states that she is having a tremor in her right hand. Pt states she has never had this problem before. Please assist

## 2013-10-03 ENCOUNTER — Telehealth: Payer: Self-pay | Admitting: *Deleted

## 2013-10-03 ENCOUNTER — Ambulatory Visit (INDEPENDENT_AMBULATORY_CARE_PROVIDER_SITE_OTHER): Payer: Medicare Other | Admitting: Cardiology

## 2013-10-03 ENCOUNTER — Encounter: Payer: Self-pay | Admitting: Cardiology

## 2013-10-03 VITALS — BP 140/80 | HR 79 | Ht 64.0 in | Wt 126.0 lb

## 2013-10-03 DIAGNOSIS — E785 Hyperlipidemia, unspecified: Secondary | ICD-10-CM

## 2013-10-03 DIAGNOSIS — I119 Hypertensive heart disease without heart failure: Secondary | ICD-10-CM

## 2013-10-03 DIAGNOSIS — I350 Nonrheumatic aortic (valve) stenosis: Secondary | ICD-10-CM

## 2013-10-03 DIAGNOSIS — E7800 Pure hypercholesterolemia, unspecified: Secondary | ICD-10-CM

## 2013-10-03 DIAGNOSIS — R413 Other amnesia: Secondary | ICD-10-CM

## 2013-10-03 DIAGNOSIS — G4762 Sleep related leg cramps: Secondary | ICD-10-CM

## 2013-10-03 DIAGNOSIS — I359 Nonrheumatic aortic valve disorder, unspecified: Secondary | ICD-10-CM

## 2013-10-03 LAB — BASIC METABOLIC PANEL
BUN: 19 mg/dL (ref 6–23)
CO2: 25 mEq/L (ref 19–32)
Calcium: 9.8 mg/dL (ref 8.4–10.5)
Chloride: 106 mEq/L (ref 96–112)
Creatinine, Ser: 0.6 mg/dL (ref 0.4–1.2)
GFR: 99.07 mL/min (ref >60.00)
Glucose, Bld: 88 mg/dL (ref 70–99)
Potassium: 4.2 mEq/L (ref 3.5–5.1)
Sodium: 140 mEq/L (ref 135–145)

## 2013-10-03 LAB — HEPATIC FUNCTION PANEL
ALT: 13 U/L (ref 0–35)
AST: 21 U/L (ref 0–37)
Albumin: 4 g/dL (ref 3.5–5.2)
Alkaline Phosphatase: 76 U/L (ref 39–117)
Bilirubin, Direct: 0 mg/dL (ref 0.0–0.3)
Total Bilirubin: 0.6 mg/dL (ref 0.2–1.2)
Total Protein: 7.3 g/dL (ref 6.0–8.3)

## 2013-10-03 LAB — LIPID PANEL
Cholesterol: 307 mg/dL — ABNORMAL HIGH (ref 0–200)
HDL: 61 mg/dL (ref >39.00)
LDL Cholesterol: 219 mg/dL — ABNORMAL HIGH (ref 0–99)
NonHDL: 246
Total CHOL/HDL Ratio: 5
Triglycerides: 134 mg/dL (ref 0.0–149.0)
VLDL: 26.8 mg/dL (ref 0.0–40.0)

## 2013-10-03 NOTE — Assessment & Plan Note (Signed)
Patient has history of hyperlipidemia and is on low-dose atorvastatin.  She is not having any muscle aches or myalgias.  Lab work today is pending.

## 2013-10-03 NOTE — Progress Notes (Signed)
Melinda Edwards [employer] Date of Birth:  23-Jul-1926 [place] [address] [place], [place]  [address] [phone]        Fax   [phone]   History of Present Illness: This pleasant 80 year old woman is seen for a six-month followup office visit. She has a history of hyperlipidemia and a history of palpitations and a history of essential hypertension. She has moved to [place]. She is very pleased with her situation there. They were able to bring their dog with them.  The patient has not been experiencing any new cardiac symptoms. She walks every day for exercise. She has not been experiencing any chest pain or dyspnea. No dizziness or syncope. She had been experiencing some painful leg cramps at night. Her leg cramps have improved since starting K. Dur 20 mEq daily.  About 6 weeks ago she fell and injured her right hand.  Since then she has been having some mild resting tremors of the right hand. Her friend who came with her today also mentioned that the patient has been experiencing some short-term memory loss problems. The patient states that she would like to switch to a new PCP.   Current Outpatient Prescriptions  Medication Sig Dispense Refill  . aspirin 81 MG tablet Take 81 mg by mouth. Every so often      . atorvastatin (LIPITOR) 10 MG tablet Take 1 tablet (10 mg total) by mouth daily.  90 tablet  3  . Calcium Carbonate-Vitamin D (CALTRATE 600+D) 600-400 MG-UNIT per tablet Take 1 tablet by mouth 2 (two) times daily.        . digoxin (LANOXIN) 0.125 MG tablet Take 1 tablet (125 mcg total) by mouth daily.  90 tablet  3  . potassium chloride SA (K-DUR,KLOR-CON) 20 MEQ tablet Take 1 tablet (20 mEq total) by mouth daily.  90 tablet  3   No current facility-administered medications for this visit.    Allergies  Allergen Reactions  . Loratadine     Patient Active Problem List   Diagnosis Date Noted  . Benign hypertensive heart disease without heart failure  08/13/2010    Priority: High  . Hyperlipidemia 08/13/2010    Priority: Medium  . Memory disorder 10/03/2013  . Nocturnal leg cramps 08/08/2012  . Aortic stenosis, mild 04/13/2011  . Palpitations 08/13/2010  . Bronchiectasis without acute exacerbation 11/15/2007    History  Smoking status  . Former Smoker -- 1.00 packs/day  . Types: Cigarettes  . Quit date: 08/12/1995  Smokeless tobacco  . Not on file    History  Alcohol Use No    Family History  Problem Relation Age of Onset  . Heart disease    . Heart attack Brother     x 3  . Heart failure Mother     chf    Review of Systems: Constitutional: no fever chills diaphoresis or fatigue or change in weight.  Head and neck: no hearing loss, no epistaxis, no photophobia or visual disturbance. Respiratory: No cough, shortness of breath or wheezing. Cardiovascular: No chest pain peripheral edema, palpitations. Gastrointestinal: No abdominal distention, no abdominal pain, no change in bowel habits hematochezia or melena. Genitourinary: No dysuria, no frequency, no urgency, no nocturia. Musculoskeletal:No arthralgias, no back pain, no gait disturbance or myalgias. Neurological: No dizziness, no headaches, no numbness, no seizures, no syncope, no weakness, no tremors. Hematologic: No lymphadenopathy, no easy bruising. Psychiatric: No confusion, no hallucinations, no sleep disturbance.    Physical Exam:  Filed Vitals:   10/03/13 1102  BP: 140/80  Pulse:    the general appearance is that of a well-developed petite elderly woman in no distress.The head and neck exam reveals pupils equal and reactive.  Extraocular movements are full.  There is no scleral icterus.  The mouth and pharynx are normal.  The neck is supple.  The carotids reveal no bruits.  The jugular venous pressure is normal.  The  thyroid is not enlarged.  There is no lymphadenopathy.  The chest is clear to percussion and auscultation.  There are no rales or rhonchi.   Expansion of the chest is symmetrical.  The precordium is quiet.  The first heart sound is normal.  The second heart sound is physiologically split.  There is no murmur gallop rub or click.  There is no abnormal lift or heave.  The abdomen is soft and nontender.  The bowel sounds are normal.  The liver and spleen are not enlarged.  There are no abdominal masses.  There are no abdominal bruits.  Extremities reveal good pedal pulses.  There is no phlebitis or edema.  There is no cyanosis or clubbing.  Strength is normal and symmetrical in all extremities.  There is no lateralizing weakness.  There are no sensory deficits.  She does have a mild intention tremor of her right hand most pronounced when she is standing up and her arm is hanging down by her side.  The skin is warm and dry.  There is no rash.     Assessment / Plan: 1. Essential hypertension without heart failure 2. Hyperlipidemia 3. short-term memory disorder 4. mild resting tremor of right hand since a fall 6 weeks ago  Plan: We're checking blood work today.  She is in the process of transitioning to a new PCP.  We will defer workup of her memory disorder and her right hand tremor to her PCP. Recheck here in 6 months for office visit lipid panel hepatic function panel and basal metabolic panel

## 2013-10-03 NOTE — Telephone Encounter (Signed)
Left message for Dr Carlyle Lipa office to call back, patient want so switch to him for PCP

## 2013-10-03 NOTE — Patient Instructions (Addendum)
Will obtain labs today and call you with the results (LP,BMT,HFP)  STOP YOUR FISH OIL   Left message for Dr Carlyle Lipa office to call back about getting you an appointment   Your physician wants you to follow-up in: 6 months with fasting labs (lp/bmet/hfp)  You will receive a reminder letter in the mail two months in advance. If you don't receive a letter, please call our office to schedule the follow-up appointment.

## 2013-10-03 NOTE — Progress Notes (Signed)
Quick Note:  Please report to patient. The recent labs are stable. Continue same medication and careful diet. The cholesterol and LDL are much higher. Has she been taking her Lipitor as directed? Watch diet carefully ______

## 2013-10-03 NOTE — Assessment & Plan Note (Addendum)
Blood pressure is remaining stable on current medication.  No dizziness or syncope

## 2013-10-03 NOTE — Assessment & Plan Note (Signed)
The patient notes some short-term memory problems.  Her friend who comes with her was the one who brought this up in our visit today.  We will have her discuss this further with her PCP.

## 2013-10-03 NOTE — Assessment & Plan Note (Signed)
The patient has not been having any symptoms referable to her mild aortic stenosis.  No exertional dizziness or syncope, no angina.  No increased dyspnea.

## 2013-10-04 NOTE — Telephone Encounter (Signed)
Left message at Dr Carlyle Lipa to call back

## 2013-10-05 NOTE — Telephone Encounter (Signed)
Left message to call back Appointment with Dr Felipa Eth Aug 7 @ 2:15, patient given information yesterday just want to confirm she wrote information down.

## 2013-10-05 NOTE — Telephone Encounter (Signed)
Patient aware of appointment

## 2013-10-19 ENCOUNTER — Other Ambulatory Visit: Payer: Self-pay

## 2013-10-19 MED ORDER — ATORVASTATIN CALCIUM 10 MG PO TABS: 10.0000 mg | ORAL_TABLET | Freq: Every day | ORAL | Status: DC

## 2013-10-19 MED ORDER — DIGOXIN 125 MCG PO TABS: 125.0000 ug | ORAL_TABLET | Freq: Every day | ORAL | Status: DC

## 2013-10-19 MED ORDER — POTASSIUM CHLORIDE CRYS ER 20 MEQ PO TBCR: 20.0000 meq | EXTENDED_RELEASE_TABLET | Freq: Every day | ORAL | Status: DC

## 2014-05-08 ENCOUNTER — Other Ambulatory Visit (HOSPITAL_COMMUNITY): Payer: Self-pay | Admitting: Geriatric Medicine

## 2014-05-08 DIAGNOSIS — Z1231 Encounter for screening mammogram for malignant neoplasm of breast: Secondary | ICD-10-CM

## 2014-06-17 ENCOUNTER — Ambulatory Visit (HOSPITAL_COMMUNITY)
Admission: RE | Admit: 2014-06-17 | Discharge: 2014-06-17 | Disposition: A | Discharge status: Home / Self Care | Payer: Medicare Other | Source: Ambulatory Visit | Attending: Geriatric Medicine | Admitting: Geriatric Medicine

## 2014-06-17 DIAGNOSIS — Z1231 Encounter for screening mammogram for malignant neoplasm of breast: Secondary | ICD-10-CM | POA: Diagnosis not present

## 2015-05-13 ENCOUNTER — Other Ambulatory Visit: Payer: Self-pay

## 2015-05-13 DIAGNOSIS — Z1231 Encounter for screening mammogram for malignant neoplasm of breast: Secondary | ICD-10-CM

## 2015-06-20 ENCOUNTER — Ambulatory Visit
Admission: RE | Admit: 2015-06-20 | Discharge: 2015-06-20 | Disposition: A | Discharge status: Home / Self Care | Payer: Medicare Other | Source: Ambulatory Visit

## 2015-06-20 DIAGNOSIS — Z1231 Encounter for screening mammogram for malignant neoplasm of breast: Secondary | ICD-10-CM

## 2016-05-12 ENCOUNTER — Other Ambulatory Visit: Payer: Self-pay | Admitting: Geriatric Medicine

## 2016-05-12 DIAGNOSIS — Z1231 Encounter for screening mammogram for malignant neoplasm of breast: Secondary | ICD-10-CM

## 2016-06-21 ENCOUNTER — Ambulatory Visit
Admission: RE | Admit: 2016-06-21 | Discharge: 2016-06-21 | Disposition: A | Discharge status: Home / Self Care | Payer: Medicare Other | Source: Ambulatory Visit | Attending: Geriatric Medicine | Admitting: Geriatric Medicine

## 2016-06-21 DIAGNOSIS — Z1231 Encounter for screening mammogram for malignant neoplasm of breast: Secondary | ICD-10-CM

## 2017-05-17 ENCOUNTER — Other Ambulatory Visit: Payer: Self-pay | Admitting: Geriatric Medicine

## 2017-05-17 DIAGNOSIS — Z1231 Encounter for screening mammogram for malignant neoplasm of breast: Secondary | ICD-10-CM

## 2017-06-22 ENCOUNTER — Ambulatory Visit
Admission: RE | Admit: 2017-06-22 | Discharge: 2017-06-22 | Disposition: A | Discharge status: Home / Self Care | Payer: Medicare Other | Source: Ambulatory Visit | Attending: Geriatric Medicine | Admitting: Geriatric Medicine

## 2017-06-22 DIAGNOSIS — Z1231 Encounter for screening mammogram for malignant neoplasm of breast: Secondary | ICD-10-CM

## 2017-08-26 ENCOUNTER — Encounter (HOSPITAL_COMMUNITY): Payer: Self-pay | Admitting: Emergency Medicine

## 2017-08-26 ENCOUNTER — Ambulatory Visit (HOSPITAL_COMMUNITY)
Admission: EM | Admit: 2017-08-26 | Discharge: 2017-08-26 | Disposition: A | Discharge status: Home / Self Care | Payer: Medicare Other | Attending: Family Medicine | Admitting: Family Medicine

## 2017-08-26 ENCOUNTER — Ambulatory Visit (INDEPENDENT_AMBULATORY_CARE_PROVIDER_SITE_OTHER): Payer: Medicare Other

## 2017-08-26 DIAGNOSIS — W19XXXA Unspecified fall, initial encounter: Secondary | ICD-10-CM

## 2017-08-26 DIAGNOSIS — M79671 Pain in right foot: Secondary | ICD-10-CM

## 2017-08-26 NOTE — ED Provider Notes (Signed)
[facility]    CSN: [id number] Arrival date & time: 08/26/17  1653     History   Chief Complaint Chief Complaint  Patient presents with  . Fall    HPI Melinda Edwards is a 84 y.o. female history of hypertension, hyperlipidemia presenting today for evaluation of right foot injury.  Patient states that last night she fell, she got up around 12:00 to use the bathroom and ended up falling.  She lives at [facility].  Since she has developed pain, redness and swelling over the lateral aspect of her right foot.  Is walking, but having pain with weightbearing.  Denies numbness or tingling.  Has not taken anything for her symptoms.  Patient did state that she hit her head on the floor when she fell, denies loss of consciousness, changes in vision, headache, weakness on one side or difficulty speaking.  Her friend with her declines any changes from her baseline.  HPI  Past Medical History:  Diagnosis Date  . Hyperlipidemia   . Hypertension   . PAC (premature atrial contraction)    [facility]IGH  . Palpitations   . Pulmonary nodule     Patient Active Problem List   Diagnosis Date Noted  . Memory disorder 10/03/2013  . Nocturnal leg cramps 08/08/2012  . Aortic stenosis, mild 04/13/2011  . Benign hypertensive heart disease without heart failure 08/13/2010  . Hyperlipidemia 08/13/2010  . Palpitations 08/13/2010  . Bronchiectasis without acute exacerbation ([place]) 11/15/2007    Past Surgical History:  Procedure Laterality Date  . BREAST EXCISIONAL BIOPSY Right 2003  . DILATION AND CURETTAGE OF UTERUS      OB History   None      Home Medications    Prior to Admission medications   Medication Sig Start Date End Date Taking? Authorizing Provider  aspirin 81 MG tablet Take 81 mg by mouth. Every so often    [provider]  atorvastatin (LIPITOR) 10 MG tablet Take 1 tablet (10 mg total) by mouth daily. Patient not taking: Reported on 08/26/2017 10/19/13    Darlin Coco, MD  Calcium Carbonate-Vitamin D (CALTRATE 600+D) 600-400 MG-UNIT per tablet Take 1 tablet by mouth 2 (two) times daily.      [provider]  digoxin (LANOXIN) 0.125 MG tablet Take 1 tablet (125 mcg total) by mouth daily. Patient not taking: Reported on 08/26/2017 10/19/13   Darlin Coco, MD  potassium chloride SA (K-DUR,KLOR-CON) 20 MEQ tablet Take 1 tablet (20 mEq total) by mouth daily. Patient not taking: Reported on 08/26/2017 10/19/13   Darlin Coco, MD    Family History Family History  Problem Relation Age of Onset  . Heart disease Unknown   . Heart attack Brother        x 3  . Heart failure Mother        chf    Social History Social History   Tobacco Use  . Smoking status: Former Smoker    Packs/day: 1.00    Types: Cigarettes    Last attempt to quit: 08/12/1995    Years since quitting: 22.0  Substance Use Topics  . Alcohol use: No  . Drug use: No     Allergies   Loratadine   Review of Systems Review of Systems  Constitutional: Negative for activity change and appetite change.  Eyes: Negative for pain and visual disturbance.  Respiratory: Negative for shortness of breath.   Cardiovascular: Negative for chest pain.  Gastrointestinal: Negative for abdominal pain,  nausea and vomiting.  Musculoskeletal: Positive for arthralgias, gait problem and myalgias. Negative for back pain, neck pain and neck stiffness.  Skin: Positive for color change. Negative for wound.  Neurological: Negative for dizziness, seizures, syncope, weakness, light-headedness, numbness and headaches.     Physical Exam Triage Vital Signs ED Triage Vitals [08/26/17 1713]  Enc Vitals Group     BP (!) 143/88     Pulse Rate 83     Resp 18     Temp 98.2 F (36.8 C)     Temp Source Oral     SpO2 97 %     Weight      Height      Head Circumference      Peak Flow      Pain Score      Pain Loc      Pain Edu?      Excl. in [place]?    No data found.  Updated  Vital Signs BP (!) 143/88   Pulse 83   Temp 98.2 F (36.8 C) (Oral)   Resp 18   SpO2 97%   Visual Acuity Right Eye Distance:   Left Eye Distance:   Bilateral Distance:    Right Eye Near:   Left Eye Near:    Bilateral Near:     Physical Exam  Constitutional: She is oriented to person, place, and time. She appears well-developed and well-nourished. No distress.  HENT:  Head: Normocephalic and atraumatic.  Eyes: Conjunctivae are normal.  Neck: Neck supple.  Cardiovascular: Normal rate.  No murmur heard. Pulmonary/Chest: Effort normal. No respiratory distress.  Abdominal: Soft. There is no tenderness.  Musculoskeletal: She exhibits no edema.  Redness and swelling overlying the distal fourth and fifth metatarsal on right foot, patient is full active range of motion at right ankle and able to wiggle toes.  Cap refill less than 2 seconds.  Neurological: She is alert and oriented to person, place, and time.  Skin: Skin is warm and dry.  Psychiatric: She has a normal mood and affect.  Nursing note and vitals reviewed.    UC Treatments / Results  Labs (all labs ordered are listed, but only abnormal results are displayed) Labs Reviewed - No data to display  EKG None  Radiology Dg Foot Complete Right  Result Date: 08/26/2017 CLINICAL DATA:  Fall.  Pain and swelling EXAM: RIGHT FOOT COMPLETE - 3+ VIEW COMPARISON:  None. FINDINGS: Negative for fracture Mild degenerative change first MTP. No erosions. Calcaneal spurring. IMPRESSION: Negative for fracture. Electronically Signed   By: Franchot Gallo M.D.   On: 08/26/2017 17:45    Procedures Procedures (including critical care time)  Medications Ordered in UC Medications - No data to display  Initial Impression / Assessment and Plan / UC Course  I have reviewed the triage vital signs and the nursing notes.  Pertinent labs & imaging results that were available during my care of the patient were reviewed by me and considered in  my medical decision making (see chart for details).     No fracture seen on imaging.  No focal neuro deficit.  At this time will treat symptomatically with Tylenol/ibuprofen for pain and swelling.  Ice and elevation.  Discussed signs and symptoms of stroke to return if she develops.Discussed strict return precautions. Patient verbalized understanding and is agreeable with plan.  Final Clinical Impressions(s) / UC Diagnoses   Final diagnoses:  Fall, initial encounter  Right foot pain     Discharge Instructions  No fracture seen on x-ray  Please take Tylenol and ibuprofen for pain and swelling  Ice and elevate left leg    ED Prescriptions    None     Controlled Substance Prescriptions [place] Controlled Substance Registry consulted? Not Applicable   Janith Lima, [place] 08/26/17 1755

## 2017-08-26 NOTE — ED Triage Notes (Signed)
Pt with fall last night and right foot pain

## 2017-08-26 NOTE — Discharge Instructions (Signed)
No fracture seen on x-ray  Please take Tylenol and ibuprofen for pain and swelling  Ice and elevate left leg

## 2017-11-30 ENCOUNTER — Ambulatory Visit
Admission: RE | Admit: 2017-11-30 | Discharge: 2017-11-30 | Disposition: A | Discharge status: Home / Self Care | Payer: Medicare Other | Source: Ambulatory Visit | Attending: Geriatric Medicine | Admitting: Geriatric Medicine

## 2017-11-30 ENCOUNTER — Other Ambulatory Visit: Payer: Self-pay | Admitting: Geriatric Medicine

## 2017-11-30 DIAGNOSIS — R05 Cough: Secondary | ICD-10-CM

## 2017-11-30 DIAGNOSIS — R059 Cough, unspecified: Secondary | ICD-10-CM

## 2017-12-01 ENCOUNTER — Other Ambulatory Visit: Payer: Self-pay | Admitting: Geriatric Medicine

## 2017-12-01 DIAGNOSIS — R9389 Abnormal findings on diagnostic imaging of other specified body structures: Secondary | ICD-10-CM

## 2017-12-02 ENCOUNTER — Ambulatory Visit
Admission: RE | Admit: 2017-12-02 | Discharge: 2017-12-02 | Disposition: A | Discharge status: Home / Self Care | Payer: Medicare Other | Source: Ambulatory Visit | Attending: Geriatric Medicine | Admitting: Geriatric Medicine

## 2017-12-02 DIAGNOSIS — R9389 Abnormal findings on diagnostic imaging of other specified body structures: Secondary | ICD-10-CM

## 2017-12-02 MED ORDER — IOPAMIDOL (ISOVUE-300) INJECTION 61%
75.0000 mL | Freq: Once | INTRAVENOUS | Status: AC | PRN
  Administered 2017-12-02: 75 mL via INTRAVENOUS

## 2018-05-19 ENCOUNTER — Other Ambulatory Visit: Payer: Self-pay | Admitting: Geriatric Medicine

## 2018-05-19 DIAGNOSIS — Z1231 Encounter for screening mammogram for malignant neoplasm of breast: Secondary | ICD-10-CM

## 2018-06-28 ENCOUNTER — Ambulatory Visit: Payer: Medicare Other

## 2018-08-16 ENCOUNTER — Ambulatory Visit
Admission: RE | Admit: 2018-08-16 | Discharge: 2018-08-16 | Disposition: A | Discharge status: Home / Self Care | Payer: Medicare Other | Source: Ambulatory Visit | Attending: Geriatric Medicine | Admitting: Geriatric Medicine

## 2018-08-16 ENCOUNTER — Other Ambulatory Visit: Payer: Self-pay

## 2018-08-16 ENCOUNTER — Ambulatory Visit: Payer: Medicare Other

## 2018-08-16 DIAGNOSIS — Z1231 Encounter for screening mammogram for malignant neoplasm of breast: Secondary | ICD-10-CM

## 2019-05-10 ENCOUNTER — Emergency Department (HOSPITAL_COMMUNITY): Payer: Medicare PPO

## 2019-05-10 ENCOUNTER — Inpatient Hospital Stay (HOSPITAL_COMMUNITY)
Admission: EM | Admit: 2019-05-10 | Discharge: 2019-05-14 | DRG: 065 | Disposition: A | Discharge status: Home Health | Payer: Medicare PPO | Source: Skilled Nursing Facility | Attending: Internal Medicine | Admitting: Internal Medicine

## 2019-05-10 ENCOUNTER — Encounter (HOSPITAL_COMMUNITY): Payer: Self-pay | Admitting: Internal Medicine

## 2019-05-10 ENCOUNTER — Other Ambulatory Visit: Payer: Self-pay

## 2019-05-10 DIAGNOSIS — R413 Other amnesia: Secondary | ICD-10-CM

## 2019-05-10 DIAGNOSIS — R4781 Slurred speech: Secondary | ICD-10-CM | POA: Diagnosis not present

## 2019-05-10 DIAGNOSIS — I639 Cerebral infarction, unspecified: Secondary | ICD-10-CM | POA: Diagnosis present

## 2019-05-10 DIAGNOSIS — R002 Palpitations: Secondary | ICD-10-CM | POA: Diagnosis present

## 2019-05-10 DIAGNOSIS — I6389 Other cerebral infarction: Secondary | ICD-10-CM | POA: Diagnosis not present

## 2019-05-10 DIAGNOSIS — E785 Hyperlipidemia, unspecified: Secondary | ICD-10-CM | POA: Diagnosis present

## 2019-05-10 DIAGNOSIS — R4701 Aphasia: Secondary | ICD-10-CM | POA: Diagnosis present

## 2019-05-10 DIAGNOSIS — R29810 Facial weakness: Secondary | ICD-10-CM | POA: Diagnosis present

## 2019-05-10 DIAGNOSIS — I10 Essential (primary) hypertension: Secondary | ICD-10-CM | POA: Diagnosis not present

## 2019-05-10 DIAGNOSIS — Z87891 Personal history of nicotine dependence: Secondary | ICD-10-CM | POA: Diagnosis not present

## 2019-05-10 DIAGNOSIS — Z66 Do not resuscitate: Secondary | ICD-10-CM | POA: Diagnosis present

## 2019-05-10 DIAGNOSIS — G8191 Hemiplegia, unspecified affecting right dominant side: Secondary | ICD-10-CM | POA: Diagnosis present

## 2019-05-10 DIAGNOSIS — Z9181 History of falling: Secondary | ICD-10-CM | POA: Diagnosis not present

## 2019-05-10 DIAGNOSIS — F0390 Unspecified dementia without behavioral disturbance: Secondary | ICD-10-CM | POA: Diagnosis not present

## 2019-05-10 DIAGNOSIS — R29704 NIHSS score 4: Secondary | ICD-10-CM | POA: Diagnosis present

## 2019-05-10 DIAGNOSIS — Z20822 Contact with and (suspected) exposure to covid-19: Secondary | ICD-10-CM | POA: Diagnosis present

## 2019-05-10 DIAGNOSIS — I4891 Unspecified atrial fibrillation: Secondary | ICD-10-CM | POA: Diagnosis not present

## 2019-05-10 DIAGNOSIS — Z79899 Other long term (current) drug therapy: Secondary | ICD-10-CM | POA: Diagnosis not present

## 2019-05-10 DIAGNOSIS — I63312 Cerebral infarction due to thrombosis of left middle cerebral artery: Secondary | ICD-10-CM | POA: Diagnosis not present

## 2019-05-10 DIAGNOSIS — I672 Cerebral atherosclerosis: Secondary | ICD-10-CM | POA: Diagnosis present

## 2019-05-10 DIAGNOSIS — Z8249 Family history of ischemic heart disease and other diseases of the circulatory system: Secondary | ICD-10-CM

## 2019-05-10 DIAGNOSIS — Z888 Allergy status to other drugs, medicaments and biological substances status: Secondary | ICD-10-CM | POA: Diagnosis not present

## 2019-05-10 DIAGNOSIS — I6930 Unspecified sequelae of cerebral infarction: Secondary | ICD-10-CM | POA: Diagnosis present

## 2019-05-10 DIAGNOSIS — E7800 Pure hypercholesterolemia, unspecified: Secondary | ICD-10-CM | POA: Diagnosis not present

## 2019-05-10 DIAGNOSIS — I119 Hypertensive heart disease without heart failure: Secondary | ICD-10-CM | POA: Diagnosis present

## 2019-05-10 HISTORY — DX: Cerebral infarction, unspecified: I63.9

## 2019-05-10 LAB — URINALYSIS, ROUTINE W REFLEX MICROSCOPIC
Bilirubin Urine: NEGATIVE
Glucose, UA: NEGATIVE mg/dL
Hgb urine dipstick: NEGATIVE
Ketones, ur: 5 mg/dL — AB
Nitrite: NEGATIVE
Protein, ur: NEGATIVE mg/dL
Specific Gravity, Urine: 1.019 (ref 1.005–1.030)
pH: 5 (ref 5.0–8.0)

## 2019-05-10 LAB — DIFFERENTIAL
Abs Immature Granulocytes: 0.07 10*3/uL (ref 0.00–0.07)
Basophils Absolute: 0.1 10*3/uL (ref 0.0–0.1)
Basophils Relative: 1 %
Eosinophils Absolute: 0.1 10*3/uL (ref 0.0–0.5)
Eosinophils Relative: 1 %
Immature Granulocytes: 1 %
Lymphocytes Relative: 19 %
Lymphs Abs: 1.7 10*3/uL (ref 0.7–4.0)
Monocytes Absolute: 0.9 10*3/uL (ref 0.1–1.0)
Monocytes Relative: 10 %
Neutro Abs: 6.1 10*3/uL (ref 1.7–7.7)
Neutrophils Relative %: 68 %

## 2019-05-10 LAB — CREATININE, SERUM
Creatinine, Ser: 0.66 mg/dL (ref 0.44–1.00)
GFR calc Af Amer: >60 mL/min (ref >60)
GFR calc non Af Amer: >60 mL/min (ref >60)

## 2019-05-10 LAB — APTT: aPTT: 29 seconds (ref 24–36)

## 2019-05-10 LAB — COMPREHENSIVE METABOLIC PANEL
ALT: 14 U/L (ref 0–44)
AST: 16 U/L (ref 15–41)
Albumin: 4.1 g/dL (ref 3.5–5.0)
Alkaline Phosphatase: 77 U/L (ref 38–126)
Anion gap: 11 (ref 5–15)
BUN: 19 mg/dL (ref 8–23)
CO2: 23 mmol/L (ref 22–32)
Calcium: 9.1 mg/dL (ref 8.9–10.3)
Chloride: 108 mmol/L (ref 98–111)
Creatinine, Ser: 0.78 mg/dL (ref 0.44–1.00)
GFR calc Af Amer: >60 mL/min (ref >60)
GFR calc non Af Amer: >60 mL/min (ref >60)
Glucose, Bld: 132 mg/dL — ABNORMAL HIGH (ref 70–99)
Potassium: 3.7 mmol/L (ref 3.5–5.1)
Sodium: 142 mmol/L (ref 135–145)
Total Bilirubin: 1.1 mg/dL (ref 0.3–1.2)
Total Protein: 7.8 g/dL (ref 6.5–8.1)

## 2019-05-10 LAB — CBC
HCT: 42.9 % (ref 36.0–46.0)
Hemoglobin: 13.8 g/dL (ref 12.0–15.0)
MCH: 27.9 pg (ref 26.0–34.0)
MCHC: 32.2 g/dL (ref 30.0–36.0)
MCV: 86.8 fL (ref 80.0–100.0)
Platelets: 221 10*3/uL (ref 150–400)
RBC: 4.94 MIL/uL (ref 3.87–5.11)
RDW: 14 % (ref 11.5–15.5)
WBC: 8.9 10*3/uL (ref 4.0–10.5)
nRBC: 0 % (ref 0.0–0.2)

## 2019-05-10 LAB — RAPID URINE DRUG SCREEN, HOSP PERFORMED
Amphetamines: NOT DETECTED
Barbiturates: NOT DETECTED
Benzodiazepines: NOT DETECTED
Cocaine: NOT DETECTED
Opiates: NOT DETECTED
Tetrahydrocannabinol: NOT DETECTED

## 2019-05-10 LAB — DIGOXIN LEVEL: Digoxin Level: <0.2 ng/mL — ABNORMAL LOW (ref 0.8–2.0)

## 2019-05-10 LAB — PROTIME-INR
INR: 1 (ref 0.8–1.2)
Prothrombin Time: 13.5 seconds (ref 11.4–15.2)

## 2019-05-10 LAB — ETHANOL: Alcohol, Ethyl (B): <10 mg/dL (ref <10)

## 2019-05-10 LAB — TSH: TSH: 3.837 u[IU]/mL (ref 0.350–4.500)

## 2019-05-10 MED ORDER — ASPIRIN EC 81 MG PO TBEC
81.0000 mg | DELAYED_RELEASE_TABLET | Freq: Every day | ORAL | Status: DC
  Administered 2019-05-11 – 2019-05-12 (×2): 81 mg via ORAL
  Filled 2019-05-10 (×2): qty 1

## 2019-05-10 MED ORDER — ASPIRIN 300 MG RE SUPP
300.0000 mg | Freq: Once | RECTAL | Status: AC
  Administered 2019-05-10: 300 mg via RECTAL
  Filled 2019-05-10: qty 1

## 2019-05-10 MED ORDER — IOHEXOL 350 MG/ML SOLN
100.0000 mL | Freq: Once | INTRAVENOUS | Status: AC | PRN
  Administered 2019-05-10: 80 mL via INTRAVENOUS

## 2019-05-10 MED ORDER — ENOXAPARIN SODIUM 40 MG/0.4ML ~~LOC~~ SOLN
40.0000 mg | SUBCUTANEOUS | Status: DC
  Administered 2019-05-10 – 2019-05-12 (×3): 40 mg via SUBCUTANEOUS
  Filled 2019-05-10 (×3): qty 0.4

## 2019-05-10 MED ORDER — ASPIRIN 81 MG PO CHEW: 324.0000 mg | CHEWABLE_TABLET | Freq: Once | ORAL | Status: DC

## 2019-05-10 MED ORDER — CLOPIDOGREL BISULFATE 75 MG PO TABS
75.0000 mg | ORAL_TABLET | Freq: Every day | ORAL | Status: DC
  Administered 2019-05-11: 11:00:00 75 mg via ORAL
  Filled 2019-05-10: qty 1

## 2019-05-10 MED ORDER — ACETAMINOPHEN 160 MG/5ML PO SOLN: 650.0000 mg | ORAL | Status: DC | PRN

## 2019-05-10 MED ORDER — CLOPIDOGREL BISULFATE 300 MG PO TABS
300.0000 mg | ORAL_TABLET | Freq: Once | ORAL | Status: DC
  Filled 2019-05-10: qty 1

## 2019-05-10 MED ORDER — STROKE: EARLY STAGES OF RECOVERY BOOK
Freq: Once | Status: AC
  Filled 2019-05-10: qty 1

## 2019-05-10 MED ORDER — ACETAMINOPHEN 325 MG PO TABS
650.0000 mg | ORAL_TABLET | ORAL | Status: DC | PRN
  Administered 2019-05-13: 22:00:00 650 mg via ORAL
  Filled 2019-05-10: qty 2

## 2019-05-10 MED ORDER — ACETAMINOPHEN 650 MG RE SUPP: 650.0000 mg | RECTAL | Status: DC | PRN

## 2019-05-10 MED ORDER — SENNOSIDES-DOCUSATE SODIUM 8.6-50 MG PO TABS: 1.0000 | ORAL_TABLET | Freq: Every evening | ORAL | Status: DC | PRN

## 2019-05-10 MED ORDER — ATORVASTATIN CALCIUM 10 MG PO TABS: 20.0000 mg | ORAL_TABLET | Freq: Every day | ORAL | Status: DC

## 2019-05-10 MED ORDER — SODIUM CHLORIDE 0.9 % IV SOLN: INTRAVENOUS | Status: DC

## 2019-05-10 NOTE — ED Notes (Signed)
Pure wick has been placed. Suction set to 1mmHg. Pt & family member have been educated on knows that MD is waiting on UA sample.

## 2019-05-10 NOTE — Consult Note (Signed)
Neurology Consult  CC: Right side weakness   History is obtained from: daughter and medical chart   HPI: Melinda Edwards is a 86 y.o. female with past medical history HTN, HLD, who presents to [facility] ED for continued right side weakness and being unsteady while walking since Saturday.  Family member states that patient had a fall on Saturday evening and denies LOC or hitting head.  Family member states she brought patient to PCP on Monday and everything seemed OK then.  Family member endorses that the patient has had generalized weakness and was unable to get out of bed this morning.  She lives at [facility].  She normally walks a lot around the facility  Patient denies CP, SOB, numbness, tingling, headache, cough N, V,D.  LKW: unknown tpa given?: no, outside of window  Modified Rankin Scale: 1-No significant post stroke disability and can perform usual duties with stroke symptoms  ROS: A 14 point ROS was performed and is negative except as noted in the HPI.  Past Medical History:  Diagnosis Date  . Hyperlipidemia   . Hypertension   . PAC (premature atrial contraction)    BENIGH  . Palpitations   . Pulmonary nodule      Family History  Problem Relation Age of Onset  . Heart disease Other   . Heart attack Brother        x 3  . Heart failure Mother        chf    Social History:  reports that she quit smoking about 23 years ago. Her smoking use included cigarettes. She smoked 1.00 pack per day. She does not have any smokeless tobacco history on file. She reports that she does not drink alcohol or use drugs.   Exam: Current vital signs: BP (!) 144/108   Pulse 92   Temp 98 F (36.7 C) (Oral)   Resp (!) 23   SpO2 97%  Vital signs in last 24 hours: Temp:  [98 F (36.7 C)] 98 F (36.7 C) (02/25 1222) Pulse Rate:  [75-92] 92 (02/25 1854) Resp:  [19-27] 23 (02/25 1500) BP: (141-164)/(54-108) 144/108 (02/25 1853) SpO2:  [92 %-97 %] 97 % (02/25 1854)  Physical Exam  Constitutional:  Appears well-developed and well-nourished.  Psych: Affect appropriate to situation Eyes: No scleral injection HENT: No OP obstrucion Head: Normocephalic.  Cardiovascular: Normal rate and regular rhythm.  Respiratory: Effort normal and breath sounds normal to anterior ascultation GI: Soft.  No distension. There is no tenderness.  Skin: WDI  Neuro: Mental Status: Patient is awake, alert, oriented to person.  This is baseline per family member at bedside  Patient is not able to give a clear and coherent history. No signs of aphasia or neglect Cranial Nerves: II: Visual Fields are full. Pupils are equal, round, and reactive to light.   III,IV, VI: EOMI without ptosis or diploplia.  V: Facial sensation is symmetric to temperature VII: Facial movement is symmetric.  VIII: hearing is intact to voice X: Uvula elevates symmetrically XI: Shoulder shrug is symmetric. XII: tongue is midline without atrophy or fasciculations.  Motor: Tone is normal. Bulk is normal. 5/5 strength was present on the left, very mild 4+/5 weakness on the right Sensory: Sensation is symmetric to light touch and temperature in the arms and legs. Deep Tendon Reflexes: 2+ and symmetric in the biceps and patellae.  Plantars: Toes are downgoing bilaterally.  Cerebellar: FNF and HKS are intact bilaterally   1a Level of Conscious.: 0 1b  LOC Questions: 2 1c LOC Commands: 0 2 Best Gaze: 0 3 Visual: 0 4 Facial Palsy: 0 5a Motor Arm - left: 0 5b Motor Arm - Right: 0 6a Motor Leg - Left: 0 6b Motor Leg - Right: 0 7 Limb Ataxia: 0 8 Sensory: 0 9 Best Language: 1 10 Dysarthria: 1 11 Extinct. and Inatten.: 0 TOTAL: 4   I have reviewed the images obtained:  CT head no acute abnormality   MRI brain small acute left basal ganglia   Primary Diagnosis:  Acute Left Basal ganglia infarct likely due to hypertension  Secondary Diagnosis: Essential (primary) hypertension  Beulah Gandy, DNP   I have seen this  patient and reviewed the above note.   Impression:   Melinda Edwards is a 86 y.o. female with past medical history HTN, HLD, who presents to [facility] ED for right-sided weakness found to be due to a left basal ganglia infarct.  I suspect this is likely small vessel disease.  Would favor dual antiplatelet therapy for 3 weeks.   Recommendations: - Admit to [facility] for stroke workup  - HgbA1c, fasting lipid panel - MRI, MRA  of the brain without contrast - Frequent neuro checks - Vital signs per unit  - Echocardiogram - Carotid dopplers - Prophylactic therapy-Antiplatelet med: Aspirin -81 mg daily with Plavix 75 mg daily following.  Milligram load - Risk factor modification - Telemetry monitoring - PT consult, OT consult, Speech consult - Stroke team to follow  Roland Rack, MD [facility] [phone]  If 7pm- 7am, please page neurology on call as listed in [place].

## 2019-05-10 NOTE — ED Triage Notes (Signed)
The patient is from [facility].  Melinda Edwards. She was sent here for possible right side weakness. Facility is concerned of a possible stroke. She has been weak for a few days. On Monday she went to her MD for weakness. Last night weakness increased along with slurred speech. MD would like her to have an MRI.

## 2019-05-10 NOTE — H&P (Signed)
[facility] History and Physical  Melinda Edwards [id number] DOB: 1926-09-18 DOA: 05/10/2019  Referring physician: ED  PCP: Lajean Manes, MD   Patient is coming from: Assisted living facility  Chief Complaint: Right-sided weakness  HPI: Melinda Edwards is a 86 y.o. female with past medical history documented in the computer as hypertension, hyperlipidemia, palpitations (family denies, not on med) presented to hospital with complaints of right-sided weakness.  Patient had been feeling weak and wobbly over the weekend and subsequently patient was taken to her primary care physician on 222.  At that time regular checkup showed things to be okay but then this morning patient was too weak to get out of the bed and her speech was slurred.  Last seen normal was last night.  Patient does live at assisted living facility and is able to do things by herself at baseline.  Patient's family denies any history of heart problem, hypertension and hyperlipidemia but currently the patient is not on any medications.    Patient denies any chest pain, shortness of breath, cough, fever or chills.  She does have history of mild dementia as per the patient's daughter at bedside.  There is no mention of nausea, vomiting, abdominal pain or diarrhea.  No mention of urinary urgency, frequency or dysuria.  No prior history of stroke.  Patient's daughter indicated there was some slurred speech and right-sided weakness with difficulty ambulation.  The speech difficulty has improved since coming to the hospital.  ED Course: In the ED, patient was noted to have a difficulty with gait.  CT head was negative.  MRI of the brain showed left basal ganglia stroke.  Neurology was consulted from the ED and recommended transfer to [facility] for stroke work-up.  Review of Systems:  All systems were reviewed and were negative unless otherwise mentioned in the HPI  Past Medical History:  Diagnosis Date  . Hyperlipidemia   .  Hypertension   . PAC (premature atrial contraction)    BENIGH  . Palpitations   . Pulmonary nodule    Past Surgical History:  Procedure Laterality Date  . BREAST EXCISIONAL BIOPSY Right 2003  . DILATION AND CURETTAGE OF UTERUS      Social History:  reports that she quit smoking about 23 years ago. Her smoking use included cigarettes. She smoked 1.00 pack per day. She does not have any smokeless tobacco history on file. She reports that she does not drink alcohol or use drugs.  Allergies  Allergen Reactions  . Loratadine Other (See Comments)    Nose bleed    Family History  Problem Relation Age of Onset  . Heart disease Other   . Heart attack Brother        x 3  . Heart failure Mother        chf     Prior to Admission medications   Medication Sig Start Date End Date Taking? Authorizing Provider  atorvastatin (LIPITOR) 10 MG tablet Take 1 tablet (10 mg total) by mouth daily. Patient not taking: Reported on 05/10/2019 10/19/13   Darlin Coco, MD  digoxin (LANOXIN) 0.125 MG tablet Take 1 tablet (125 mcg total) by mouth daily. Patient not taking: Reported on 05/10/2019 10/19/13   Darlin Coco, MD  potassium chloride SA (K-DUR,KLOR-CON) 20 MEQ tablet Take 1 tablet (20 mEq total) by mouth daily. Patient not taking: Reported on 05/10/2019 10/19/13   Darlin Coco, MD    Physical Exam: Vitals:   05/10/19 1330 05/10/19 1401 05/10/19 1446  05/10/19 1500  BP: (!) 148/54 (!) 164/78 (!) 152/79 (!) 148/82  Pulse: 77 86 75 78  Resp: (!) 25 (!) 23 19 (!) 23  Temp:      TempSrc:      SpO2: 96% 97% 92% 95%   Wt Readings from Last 3 Encounters:  10/03/13 57.2 kg  08/14/13 57.2 kg  02/05/13 57.6 kg   There is no height or weight on file to calculate BMI.  General:  Average built, not in obvious distress HENT: Normocephalic, pupils equally reacting to light and accommodation.  No scleral pallor or icterus noted. Oral mucosa is moist.  Chest:  Clear breath sounds.  Diminished  breath sounds bilaterally. No crackles or wheezes.  CVS: S1 &S2 heard. No murmur.  Regular rate and rhythm. Abdomen: Soft, nontender, nondistended.  Bowel sounds are heard.  Liver is not palpable, no abdominal mass palpated Extremities: No cyanosis, clubbing or edema.  Peripheral pulses are palpable. Psych: Alert, awake and oriented, normal mood CNS:  No cranial nerve deficits.  Power equal in all extremities.   No cerebellar signs.   Skin: Warm and dry.  No rashes noted.  Labs on Admission:   CBC: Recent Labs  Lab 05/10/19 1237  WBC 8.9  NEUTROABS 6.1  HGB 13.8  HCT 42.9  MCV 86.8  PLT [id number]    Basic Metabolic Panel: Recent Labs  Lab 05/10/19 1237  NA 142  K 3.7  CL 108  CO2 23  GLUCOSE 132*  BUN 19  CREATININE 0.78  CALCIUM 9.1    Liver Function Tests: Recent Labs  Lab 05/10/19 1237  AST 16  ALT 14  ALKPHOS 77  BILITOT 1.1  PROT 7.8  ALBUMIN 4.1   No results for input(s): LIPASE, AMYLASE in the last 168 hours. No results for input(s): AMMONIA in the last 168 hours.  Cardiac Enzymes: No results for input(s): CKTOTAL, CKMB, CKMBINDEX, TROPONINI in the last 168 hours.  BNP (last 3 results) No results for input(s): BNP in the last 8760 hours.  ProBNP (last 3 results) No results for input(s): PROBNP in the last 8760 hours.  CBG: No results for input(s): GLUCAP in the last 168 hours.  Lipase  No results found for: LIPASE   Urinalysis    Component Value Date/Time   COLORURINE YELLOW 05/10/2019 1219   APPEARANCEUR HAZY (A) 05/10/2019 1219   LABSPEC 1.019 05/10/2019 1219   PHURINE 5.0 05/10/2019 1219   GLUCOSEU NEGATIVE 05/10/2019 1219   HGBUR NEGATIVE 05/10/2019 1219   BILIRUBINUR NEGATIVE 05/10/2019 1219   KETONESUR 5 (A) 05/10/2019 1219   PROTEINUR NEGATIVE 05/10/2019 1219   NITRITE NEGATIVE 05/10/2019 1219   LEUKOCYTESUR SMALL (A) 05/10/2019 1219     Drugs of Abuse     Component Value Date/Time   LABOPIA NONE DETECTED 05/10/2019 1219    COCAINSCRNUR NONE DETECTED 05/10/2019 1219   LABBENZ NONE DETECTED 05/10/2019 1219   AMPHETMU NONE DETECTED 05/10/2019 1219   THCU NONE DETECTED 05/10/2019 1219   LABBARB NONE DETECTED 05/10/2019 1219      Radiological Exams on Admission: CT HEAD WO CONTRAST  Result Date: 05/10/2019 CLINICAL DATA:  Right-sided weakness. EXAM: CT HEAD WITHOUT CONTRAST TECHNIQUE: Contiguous axial images were obtained from the base of the skull through the vertex without intravenous contrast. COMPARISON:  None. FINDINGS: Brain: Mild diffuse cortical atrophy is noted. Mild chronic ischemic white matter disease is noted. No mass effect or midline shift is noted. Ventricular size is within normal limits. There is no  evidence of mass lesion, hemorrhage or acute infarction. Vascular: No hyperdense vessel or unexpected calcification. Skull: Normal. Negative for fracture or focal lesion. Sinuses/Orbits: No acute finding. Other: None. IMPRESSION: Mild diffuse cortical atrophy. Mild chronic ischemic white matter disease. No acute intracranial abnormality seen. Electronically Signed   By: Marijo Conception M.D.   On: 05/10/2019 12:56   MR BRAIN WO CONTRAST  Result Date: 05/10/2019 CLINICAL DATA:  Possible stroke with right-sided weakness EXAM: MRI HEAD WITHOUT CONTRAST TECHNIQUE: Multiplanar, multiecho pulse sequences of the brain and surrounding structures were obtained without intravenous contrast. COMPARISON:  None. FINDINGS: Brain: There is a small acute infarction involving the left basal ganglia and adjacent white matter. No evidence of intracranial hemorrhage. There is no intracranial mass, mass effect, or edema. There is no hydrocephalus or extra-axial fluid collection. Prominence of the ventricles and sulci reflects generalized parenchymal volume loss. Patchy and confluent areas of T2 hyperintensity in the supratentorial white matter are nonspecific but may reflect mild to moderate chronic microvascular ischemic changes.  Vascular: Major vessel flow voids at the skull base are preserved. Skull and upper cervical spine: Normal marrow signal is preserved. Sinuses/Orbits: Trace mucosal thickening. Bilateral lens replacements. Other: Sella is unremarkable.  Mastoid air cells are clear. IMPRESSION: Small acute infarction involving the left basal ganglia and adjacent white matter. Chronic microvascular ischemic changes. Electronically Signed   By: Macy Mis M.D.   On: 05/10/2019 16:09   DG Chest Portable 1 View  Result Date: 05/10/2019 CLINICAL DATA:  New onset of right-sided weakness and slurred speech. EXAM: PORTABLE CHEST 1 VIEW COMPARISON:  Chest x-ray dated 11/30/2017 FINDINGS: The heart size and pulmonary vascularity are normal and the lungs are clear. Chronic bilateral apical pleural thickening. No significant bone abnormality. IMPRESSION: No acute abnormality. Electronically Signed   By: Lorriane Shire M.D.   On: 05/10/2019 13:22    EKG: Personally reviewed by me which shows sinus rthym  Assessment/Plan Principal Problem:   Stroke [facility]) Active Problems:   Hyperlipidemia   Palpitations   Memory disorder   Essential hypertension  Acute CVA-left-sided basal ganglia.  Patient is not a TPA candidate out of window.  Will monitor in telemetry unit.  Will obtain carotid duplex ultrasound, 2D echocardiogram.   Neurochecks every 4 hourly.  Consider aspirin and statins.  Patient received 1 dose of aspirin in the ED.  Check a fasting lipid profile, hemoglobin A1c, TSH.  Allow permissive hypertension for now.  Will obtain neurology consultation.  Neurology has been notified from the ED.  Will get physical therapy, Occupational Therapy and speech therapy evaluation.  Obtain CT angiogram of the head and neck.  We will add low-dose aspirin and Lipitor.  History of hypertension, hyperlipidemia as per the medical records.  Patient is not on any medication.  We will close monitor during hospitalization.  Check fasting lipid  profile.  History of mild dementia.  Patient is currently at her assisted living facility.  Continue supportive care  History of palpitations.  Digoxin is listed in our medication list but patient is currently not on it.  Levels were sent from the ED to confirm.  History of PAC documented in the computer.  EKG this time showed normal sinus rhythm.   DVT Prophylaxis: Lovenox subcu  Consultant: Neurology  Code Status: DO NOT RESUSCITATE.  Spoke with the patient's daughter at bedside.  Microbiology none  Antibiotics: None  Family Communication:  Patients' condition and plan of care including tests being ordered have been discussed  with the patient and the patient's daughter at bedside who indicate understanding and agree with the plan.  Disposition Plan: Assisted living facility   Severity of Illness: The appropriate patient status for this patient is INPATIENT. Inpatient status is judged to be reasonable and necessary in order to provide the required intensity of service to ensure the patient's safety. The patient's presenting symptoms, physical exam findings, and initial radiographic and laboratory data in the context of their chronic comorbidities is felt to place them at high risk for further clinical deterioration. Furthermore, it is not anticipated that the patient will be medically stable for discharge from the hospital within 2 midnights of admission.  I certify that at the point of admission it is my clinical judgment that the patient will require inpatient hospital care spanning beyond 2 midnights from the point of admission due to high intensity of service, high risk for further deterioration and high frequency of surveillance required.   Signed, Flora Lipps, MD [facility] 05/10/2019

## 2019-05-10 NOTE — ED Notes (Signed)
Adjusted pt's bed. Reminded pt of need for UA.

## 2019-05-10 NOTE — ED Provider Notes (Signed)
[facility] DEPT Provider Note   CSN: [id number] Arrival date & time: 05/10/19  1204     History Chief Complaint  Patient presents with  . Extremity Weakness  . Aphasia    Melinda Edwards is a 86 y.o. female.  Pt presents to the ED today with right sided weakness.  Pt has been feeling weak and wobbly since this past weekend.  The family member took pt to her pcp on Monday, 2/22 and things looked ok.  Pt's family member said she had to get pt a walker as she was so wobbly.  Until then, she had been walking a lot.  Pt was unable to get out of bed this morning.  She said she felt too weak.  Speech was slurred.  LSN last night.  No specific time.  Pt lives at an ALF and the nurses were not able to tell the family member an exact time.  Pt denies any pain.        Past Medical History:  Diagnosis Date  . Hyperlipidemia   . Hypertension   . PAC (premature atrial contraction)    BENIGH  . Palpitations   . Pulmonary nodule     Patient Active Problem List   Diagnosis Date Noted  . Memory disorder 10/03/2013  . Nocturnal leg cramps 08/08/2012  . Aortic stenosis, mild 04/13/2011  . Benign hypertensive heart disease without heart failure 08/13/2010  . Hyperlipidemia 08/13/2010  . Palpitations 08/13/2010  . Bronchiectasis without acute exacerbation ([place]) 11/15/2007    Past Surgical History:  Procedure Laterality Date  . BREAST EXCISIONAL BIOPSY Right 2003  . DILATION AND CURETTAGE OF UTERUS       OB History   No obstetric history on file.     Family History  Problem Relation Age of Onset  . Heart disease Other   . Heart attack Brother        x 3  . Heart failure Mother        chf    Social History   Tobacco Use  . Smoking status: Former Smoker    Packs/day: 1.00    Types: Cigarettes    Quit date: 08/12/1995    Years since quitting: 23.7  Substance Use Topics  . Alcohol use: No  . Drug use: No    Home Medications Prior to  Admission medications   Medication Sig Start Date End Date Taking? Authorizing Provider  atorvastatin (LIPITOR) 10 MG tablet Take 1 tablet (10 mg total) by mouth daily. Patient not taking: Reported on 05/10/2019 10/19/13   Darlin Coco, MD  digoxin (LANOXIN) 0.125 MG tablet Take 1 tablet (125 mcg total) by mouth daily. Patient not taking: Reported on 05/10/2019 10/19/13   Darlin Coco, MD  potassium chloride SA (K-DUR,KLOR-CON) 20 MEQ tablet Take 1 tablet (20 mEq total) by mouth daily. Patient not taking: Reported on 05/10/2019 10/19/13   Darlin Coco, MD    Allergies    Loratadine  Review of Systems   Review of Systems  Neurological: Positive for speech difficulty and weakness.  All other systems reviewed and are negative.   Physical Exam Updated Vital Signs BP (!) 148/82   Pulse 78   Temp 98 F (36.7 C) (Oral)   Resp (!) 23   SpO2 95%   Physical Exam Vitals and nursing note reviewed.  Constitutional:      Appearance: Normal appearance.  HENT:     Head: Normocephalic and atraumatic.     Right  Ear: External ear normal.     Left Ear: External ear normal.     Nose: Nose normal.     Mouth/Throat:     Mouth: Mucous membranes are moist.     Pharynx: Oropharynx is clear.  Eyes:     Extraocular Movements: Extraocular movements intact.     Conjunctiva/sclera: Conjunctivae normal.     Pupils: Pupils are equal, round, and reactive to light.  Cardiovascular:     Rate and Rhythm: Normal rate and regular rhythm.     Pulses: Normal pulses.     Heart sounds: Normal heart sounds.  Pulmonary:     Effort: Pulmonary effort is normal.     Breath sounds: Normal breath sounds.  Abdominal:     General: Abdomen is flat. Bowel sounds are normal.     Palpations: Abdomen is soft.  Musculoskeletal:        General: Normal range of motion.     Cervical back: Normal range of motion and neck supple.  Skin:    General: Skin is warm.     Capillary Refill: Capillary refill takes less  than 2 seconds.  Neurological:     Mental Status: She is alert.     Comments: Slurred speech Pt unable to stand without a lot of assistance  Psychiatric:        Mood and Affect: Mood normal.        Behavior: Behavior normal.     ED Results / Procedures / Treatments   Labs (all labs ordered are listed, but only abnormal results are displayed) Labs Reviewed  COMPREHENSIVE METABOLIC PANEL - Abnormal; Notable for the following components:      Result Value   Glucose, Bld 132 (*)    All other components within normal limits  URINALYSIS, ROUTINE W REFLEX MICROSCOPIC - Abnormal; Notable for the following components:   APPearance HAZY (*)    Ketones, ur 5 (*)    Leukocytes,Ua SMALL (*)    Bacteria, UA RARE (*)    All other components within normal limits  SARS CORONAVIRUS 2 (TAT 6-24 HRS)  ETHANOL  PROTIME-INR  APTT  CBC  DIFFERENTIAL  RAPID URINE DRUG SCREEN, HOSP PERFORMED  DIGOXIN LEVEL    EKG EKG Interpretation  Date/Time:  Thursday May 10 2019 12:24:33 EST Ventricular Rate:  84 PR Interval:    QRS Duration: 87 QT Interval:  391 QTC Calculation: 463 R Axis:   8 Text Interpretation: Sinus rhythm Atrial premature complexes Short PR interval RSR' in V1 or V2, right VCD or RVH No significant change since last tracing Confirmed by Isla Pence [phone]) on 05/10/2019 12:31:09 PM   Radiology CT HEAD WO CONTRAST  Result Date: 05/10/2019 CLINICAL DATA:  Right-sided weakness. EXAM: CT HEAD WITHOUT CONTRAST TECHNIQUE: Contiguous axial images were obtained from the base of the skull through the vertex without intravenous contrast. COMPARISON:  None. FINDINGS: Brain: Mild diffuse cortical atrophy is noted. Mild chronic ischemic white matter disease is noted. No mass effect or midline shift is noted. Ventricular size is within normal limits. There is no evidence of mass lesion, hemorrhage or acute infarction. Vascular: No hyperdense vessel or unexpected calcification. Skull:  Normal. Negative for fracture or focal lesion. Sinuses/Orbits: No acute finding. Other: None. IMPRESSION: Mild diffuse cortical atrophy. Mild chronic ischemic white matter disease. No acute intracranial abnormality seen. Electronically Signed   By: Marijo Conception M.D.   On: 05/10/2019 12:56   MR BRAIN WO CONTRAST  Result Date: 05/10/2019 CLINICAL DATA:  Possible stroke with right-sided weakness EXAM: MRI HEAD WITHOUT CONTRAST TECHNIQUE: Multiplanar, multiecho pulse sequences of the brain and surrounding structures were obtained without intravenous contrast. COMPARISON:  None. FINDINGS: Brain: There is a small acute infarction involving the left basal ganglia and adjacent white matter. No evidence of intracranial hemorrhage. There is no intracranial mass, mass effect, or edema. There is no hydrocephalus or extra-axial fluid collection. Prominence of the ventricles and sulci reflects generalized parenchymal volume loss. Patchy and confluent areas of T2 hyperintensity in the supratentorial white matter are nonspecific but may reflect mild to moderate chronic microvascular ischemic changes. Vascular: Major vessel flow voids at the skull base are preserved. Skull and upper cervical spine: Normal marrow signal is preserved. Sinuses/Orbits: Trace mucosal thickening. Bilateral lens replacements. Other: Sella is unremarkable.  Mastoid air cells are clear. IMPRESSION: Small acute infarction involving the left basal ganglia and adjacent white matter. Chronic microvascular ischemic changes. Electronically Signed   By: Macy Mis M.D.   On: 05/10/2019 16:09   DG Chest Portable 1 View  Result Date: 05/10/2019 CLINICAL DATA:  New onset of right-sided weakness and slurred speech. EXAM: PORTABLE CHEST 1 VIEW COMPARISON:  Chest x-ray dated 11/30/2017 FINDINGS: The heart size and pulmonary vascularity are normal and the lungs are clear. Chronic bilateral apical pleural thickening. No significant bone abnormality.  IMPRESSION: No acute abnormality. Electronically Signed   By: Lorriane Shire M.D.   On: 05/10/2019 13:22    Procedures Procedures (including critical care time)  Medications Ordered in ED Medications  aspirin chewable tablet 324 mg (has no administration in time range)    ED Course  I have reviewed the triage vital signs and the nursing notes.  Pertinent labs & imaging results that were available during my care of the patient were reviewed by me and considered in my medical decision making (see chart for details).    MDM Rules/Calculators/A&P                     CT scan was negative, so MRI was done.  Pt's MRI does show an infarct.  Pt d/w Dr. Leonel Ramsay who recommended admission to [facility] and CTA head and neck.  Pt given an asa.  Pt d/w [facility] for admission.    Final Clinical Impression(s) / ED Diagnoses Final diagnoses:  Cerebrovascular accident (CVA), unspecified mechanism ([place])    Rx / [place] Orders ED Discharge Orders    None       Isla Pence, MD 05/10/19 1708

## 2019-05-10 NOTE — H&P (Deleted)
Neurology H&P  CC: Right side weakness   History is obtained from: daughter and medical chart   HPI: Melinda Edwards is a 86 y.o. female with past medical history HTN, HLD, who presents to [facility] ED for continued right side weakness and being unsteady while walking since Saturday.  Family member states that patient had a fall on Saturday evening and denies LOC or hitting head.  Family member states she brought patient to PCP on Monday and everything seemed OK then.  Family member endorses that the patient has had generalized weakness and was unable to get out of bed this morning.  She lives at [place].  She normally walks a lot around the facility  Patient denies CP, SOB, numbness, tingling, headache, cough N, V,D.  LKW: unknown tpa given?: no, outside of window  Modified Rankin Scale: 1-No significant post stroke disability and can perform usual duties with stroke symptoms  ROS: A 14 point ROS was performed and is negative except as noted in the HPI.  Past Medical History:  Diagnosis Date  . Hyperlipidemia   . Hypertension   . PAC (premature atrial contraction)    BENIGH  . Palpitations   . Pulmonary nodule      Family History  Problem Relation Age of Onset  . Heart disease Other   . Heart attack Brother        x 3  . Heart failure Mother        chf    Social History:  reports that she quit smoking about 23 years ago. Her smoking use included cigarettes. She smoked 1.00 pack per day. She does not have any smokeless tobacco history on file. She reports that she does not drink alcohol or use drugs.   Exam: Current vital signs: BP (!) 148/82   Pulse 78   Temp 98 F (36.7 C) (Oral)   Resp (!) 23   SpO2 95%  Vital signs in last 24 hours: Temp:  [98 F (36.7 C)] 98 F (36.7 C) (02/25 1222) Pulse Rate:  [75-86] 78 (02/25 1500) Resp:  [19-27] 23 (02/25 1500) BP: (141-164)/(54-82) 148/82 (02/25 1500) SpO2:  [92 %-97 %] 95 % (02/25 1500)  Physical Exam  Constitutional: Appears  well-developed and well-nourished.  Psych: Affect appropriate to situation Eyes: No scleral injection HENT: No OP obstrucion Head: Normocephalic.  Cardiovascular: Normal rate and regular rhythm.  Respiratory: Effort normal and breath sounds normal to anterior ascultation GI: Soft.  No distension. There is no tenderness.  Skin: WDI  Neuro: Mental Status: Patient is awake, alert, oriented to person.  This is baseline per family member at bedside  Patient is not able to give a clear and coherent history. No signs of aphasia or neglect Cranial Nerves: II: Visual Fields are full. Pupils are equal, round, and reactive to light.   III,IV, VI: EOMI without ptosis or diploplia.  V: Facial sensation is symmetric to temperature VII: Facial movement is symmetric.  VIII: hearing is intact to voice X: Uvula elevates symmetrically XI: Shoulder shrug is symmetric. XII: tongue is midline without atrophy or fasciculations.  Motor: Tone is normal. Bulk is normal. 5/5 strength was present in all four extremities.  Sensory: Sensation is symmetric to light touch and temperature in the arms and legs. Deep Tendon Reflexes: 2+ and symmetric in the biceps and patellae.  Plantars: Toes are downgoing bilaterally.  Cerebellar: FNF and HKS are intact bilaterally   1a Level of Conscious.: 0 1b LOC Questions: 2 1c LOC  Commands: 0 2 Best Gaze: 0 3 Visual: 0 4 Facial Palsy: 0 5a Motor Arm - left: 0 5b Motor Arm - Right: 0 6a Motor Leg - Left: 0 6b Motor Leg - Right: 0 7 Limb Ataxia: 0 8 Sensory: 0 9 Best Language: 1 10 Dysarthria: 1 11 Extinct. 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 female with past medical  history HTN, HLD, who presents to [facility] ED for continued right side weakness and being unsteady while walking since Saturday.     Recommendations: - Admit to [facility] for stroke workup  - HgbA1c, fasting lipid panel - MRI, MRA  of the brain without contrast - Frequent neuro checks - Vital signs per unit  - Echocardiogram - Carotid dopplers - Prophylactic therapy-Antiplatelet med: Aspirin - dose 325mg  PO or 300mg  PR - Risk factor modification - Telemetry monitoring - PT consult, OT consult, Speech consult - Stroke team to follow   Beulah Gandy, DNP

## 2019-05-10 NOTE — ED Notes (Signed)
ED TO INPATIENT HANDOFF REPORT  Name/Age/Gender Melinda Edwards 86 y.o. female  Code Status   Home/SNF/Other Nursing Home  Chief Complaint Stroke [facility]) [I63.9]  Level of Care/Admitting Diagnosis ED Disposition    ED Disposition Condition [facility] Area: [place] [[id number]]  Level of Care: Telemetry Medical [104]  May admit patient to [facility] or [facility] if equivalent level of care is available:: No  Covid Evaluation: Asymptomatic Screening Protocol (No Symptoms)  Diagnosis: Stroke [facility]) [id number]  Admitting Physician: Flora Lipps [id number]  Attending Physician: Flora Lipps [id number]  Estimated length of stay: past midnight tomorrow  Certification:: I certify this patient will need inpatient services for at least 2 midnights       Medical History Past Medical History:  Diagnosis Date  . Hyperlipidemia   . Hypertension   . PAC (premature atrial contraction)    BENIGH  . Palpitations   . Pulmonary nodule     Allergies Allergies  Allergen Reactions  . Loratadine Other (See Comments)    Nose bleed    IV Location/Drains/Wounds Patient Lines/Drains/Airways Status   Active Line/Drains/Airways    Name:   Placement date:   Placement time:   Site:   Days:   Peripheral IV 05/10/19 Right Forearm   05/10/19    1239    Forearm   less than 1          Labs/Imaging Results for orders placed or performed during the hospital encounter of 05/10/19 (from the past 48 hour(s))  Urine rapid drug screen (hosp performed)     Status: None   Collection Time: 05/10/19 12:19 PM  Result Value Ref Range   Opiates NONE DETECTED NONE DETECTED   Cocaine NONE DETECTED NONE DETECTED   Benzodiazepines NONE DETECTED NONE DETECTED   Amphetamines NONE DETECTED NONE DETECTED   Tetrahydrocannabinol NONE DETECTED NONE DETECTED   Barbiturates NONE DETECTED NONE DETECTED    Comment: (NOTE) DRUG SCREEN FOR MEDICAL PURPOSES ONLY.  IF CONFIRMATION  IS NEEDED FOR ANY PURPOSE, NOTIFY LAB WITHIN 5 DAYS. LOWEST DETECTABLE LIMITS FOR URINE DRUG SCREEN Drug Class                     Cutoff (ng/mL) Amphetamine and metabolites    1000 Barbiturate and metabolites    200 Benzodiazepine                 [id number] Tricyclics and metabolites     300 Opiates and metabolites        300 Cocaine and metabolites        300 THC                            50 Performed at [facility], [place] [address], [place], [place] [address]   Urinalysis, Routine w reflex microscopic     Status: Abnormal   Collection Time: 05/10/19 12:19 PM  Result Value Ref Range   Color, Urine YELLOW YELLOW   APPearance HAZY (A) CLEAR   Specific Gravity, Urine 1.019 1.005 - 1.030   pH 5.0 5.0 - 8.0   Glucose, UA NEGATIVE NEGATIVE mg/dL   Hgb urine dipstick NEGATIVE NEGATIVE   Bilirubin Urine NEGATIVE NEGATIVE   Ketones, ur 5 (A) NEGATIVE mg/dL   Protein, ur NEGATIVE NEGATIVE mg/dL   Nitrite NEGATIVE NEGATIVE   Leukocytes,Ua SMALL (A) NEGATIVE   RBC / HPF 0-5 0 - 5  RBC/hpf   WBC, UA 21-50 0 - 5 WBC/hpf   Bacteria, UA RARE (A) NONE SEEN   Squamous Epithelial / LPF 0-5 0 - 5   Mucus PRESENT     Comment: Performed at [facility], [place] [address], [place], [place] [address]  Ethanol     Status: None   Collection Time: 05/10/19 12:37 PM  Result Value Ref Range   Alcohol, Ethyl (B) <10 <10 mg/dL    Comment: (NOTE) Lowest detectable limit for serum alcohol is 10 mg/dL. For medical purposes only. Performed at [facility], [place] [address], [place], [place] [address]   Protime-INR     Status: None   Collection Time: 05/10/19 12:37 PM  Result Value Ref Range   Prothrombin Time 13.5 11.4 - 15.2 seconds   INR 1.0 0.8 - 1.2    Comment: (NOTE) INR goal varies based on device and disease states. Performed at [facility], [place] [address] [place], [place] [address]   APTT     Status: None   Collection  Time: 05/10/19 12:37 PM  Result Value Ref Range   aPTT 29 24 - 36 seconds    Comment: Performed at [facility], [place] [address] [place], [place] [address]  CBC     Status: None   Collection Time: 05/10/19 12:37 PM  Result Value Ref Range   WBC 8.9 4.0 - 10.5 K/uL   RBC 4.94 3.87 - 5.11 MIL/uL   Hemoglobin 13.8 12.0 - 15.0 g/dL   HCT 42.9 36.0 - 46.0 %   MCV 86.8 80.0 - 100.0 fL   MCH 27.9 26.0 - 34.0 pg   MCHC 32.2 30.0 - 36.0 g/dL   RDW 14.0 11.5 - 15.5 %   Platelets 221 150 - 400 K/uL   nRBC 0.0 0.0 - 0.2 %    Comment: Performed at [facility], [place] [address] [place], [place] [address]  Differential     Status: None   Collection Time: 05/10/19 12:37 PM  Result Value Ref Range   Neutrophils Relative % 68 %   Neutro Abs 6.1 1.7 - 7.7 K/uL   Lymphocytes Relative 19 %   Lymphs Abs 1.7 0.7 - 4.0 K/uL   Monocytes Relative 10 %   Monocytes Absolute 0.9 0.1 - 1.0 K/uL   Eosinophils Relative 1 %   Eosinophils Absolute 0.1 0.0 - 0.5 K/uL   Basophils Relative 1 %   Basophils Absolute 0.1 0.0 - 0.1 K/uL   Immature Granulocytes 1 %   Abs Immature Granulocytes 0.07 0.00 - 0.07 K/uL    Comment: Performed at [facility], [place] [address] [place], [place] [address]  Comprehensive metabolic panel     Status: Abnormal   Collection Time: 05/10/19 12:37 PM  Result Value Ref Range   Sodium 142 135 - 145 mmol/L   Potassium 3.7 3.5 - 5.1 mmol/L   Chloride 108 98 - 111 mmol/L   CO2 23 22 - 32 mmol/L   Glucose, Bld 132 (H) 70 - 99 mg/dL    Comment: Glucose reference range applies only to samples taken after fasting for at least 8 hours.   BUN 19 8 - 23 mg/dL   Creatinine, Ser 0.78 0.44 - 1.00 mg/dL   Calcium 9.1 8.9 - 10.3 mg/dL   Total Protein 7.8 6.5 - 8.1 g/dL   Albumin 4.1 3.5 - 5.0 g/dL   AST 16 15 - 41 U/L  ALT 14 0 - 44 U/L   Alkaline Phosphatase 77 38 - 126 U/L   Total Bilirubin 1.1 0.3 - 1.2 mg/dL   GFR calc non Af Amer  >60 >60 mL/min   GFR calc Af Amer >60 >60 mL/min   Anion gap 11 5 - 15    Comment: Performed at [facility], [place] [address], [place], [place] [address]   CT Angio Head W or Wo Contrast  Result Date: 05/10/2019 CLINICAL DATA:  Acute infarct, follow-up EXAM: CT ANGIOGRAPHY HEAD AND NECK TECHNIQUE: Multidetector CT imaging of the head and neck was performed using the standard protocol during bolus administration of intravenous contrast. Multiplanar CT image reconstructions and MIPs were obtained to evaluate the vascular anatomy. Carotid stenosis measurements (when applicable) are obtained utilizing NASCET criteria, using the distal internal carotid diameter as the denominator. CONTRAST:  51mL OMNIPAQUE IOHEXOL 350 MG/ML SOLN COMPARISON:  Earlier same day CT head FINDINGS: CT HEAD FINDINGS Brain: Small infarction involving the left basal ganglia and adjacent white matter is again identified. There is no new loss of gray-white differentiation. There is no acute intracranial hemorrhage or mass effect. Stable findings of chronic microvascular ischemic changes and parenchymal volume loss. Vascular: No new findings. Skull: Unremarkable. Sinuses: No acute abnormality. Orbits: No acute abnormality Review of the MIP images confirms the above findings CTA NECK FINDINGS Aortic arch: Great vessel origins are patent. Right carotid system: Patent. Calcified plaque at the bifurcation. There is no measurable stenosis at the ICA origin. Left carotid system: Patent. No significant plaque or measurable stenosis at the ICA origin. Vertebral arteries: Patent and codominant. Skeleton: Multilevel cervical spine degenerative changes. Other neck: 1 cm left thyroid nodule.  No neck mass or adenopathy. Upper chest: Scarring at the lung apices. Review of the MIP images confirms the above findings CTA HEAD FINDINGS Anterior circulation: Intracranial internal carotid arteries are patent with mild calcified plaque.  Anterior and middle cerebral arteries are patent. There is multifocal atherosclerotic irregularity and stenoses. For example, moderate to severe short segment stenosis of the proximal right M1 MCA. Focal moderate stenosis of the left M1 MCA. Moderate to marked stenosis of the right A2 ACA. Additional stenoses are present more distally. Question of a 2 mm broad-based shallow inferiorly directed outpouching from the distal supraclinoid left ICA versus atherosclerotic irregularity. Posterior circulation: Intracranial vertebral arteries, basilar artery, and posterior cerebral arteries are patent. There is atherosclerotic irregularity and multifocal stenoses including diffuse mild to moderate stenosis of the right P2 PCA and least moderate stenosis at the left P2 PCA branch point. Venous sinuses: As permitted by contrast timing, patent. Review of the MIP images confirms the above findings IMPRESSION: Evolving small infarction of the left basal ganglia and adjacent white matter. No acute intracranial hemorrhage. Stable chronic findings detailed above. No hemodynamically significant stenosis in the neck. Multifocal intracranial atherosclerosis involving anterior and posterior circulations. Question of a 2 mm broad-based outpouching from the distal supraclinoid left ICA versus atherosclerotic irregularity. Electronically Signed   By: Macy Mis M.D.   On: 05/10/2019 18:35   CT HEAD WO CONTRAST  Result Date: 05/10/2019 CLINICAL DATA:  Right-sided weakness. EXAM: CT HEAD WITHOUT CONTRAST TECHNIQUE: Contiguous axial images were obtained from the base of the skull through the vertex without intravenous contrast. COMPARISON:  None. FINDINGS: Brain: Mild diffuse cortical atrophy is noted. Mild chronic ischemic white matter disease is noted. No mass effect or midline shift is noted. Ventricular size is within normal limits. There is no  evidence of mass lesion, hemorrhage or acute infarction. Vascular: No hyperdense vessel  or unexpected calcification. Skull: Normal. Negative for fracture or focal lesion. Sinuses/Orbits: No acute finding. Other: None. IMPRESSION: Mild diffuse cortical atrophy. Mild chronic ischemic white matter disease. No acute intracranial abnormality seen. Electronically Signed   By: Marijo Conception M.D.   On: 05/10/2019 12:56   CT Angio Neck W and/or Wo Contrast  Result Date: 05/10/2019 CLINICAL DATA:  Acute infarct, follow-up EXAM: CT ANGIOGRAPHY HEAD AND NECK TECHNIQUE: Multidetector CT imaging of the head and neck was performed using the standard protocol during bolus administration of intravenous contrast. Multiplanar CT image reconstructions and MIPs were obtained to evaluate the vascular anatomy. Carotid stenosis measurements (when applicable) are obtained utilizing NASCET criteria, using the distal internal carotid diameter as the denominator. CONTRAST:  90mL OMNIPAQUE IOHEXOL 350 MG/ML SOLN COMPARISON:  Earlier same day CT head FINDINGS: CT HEAD FINDINGS Brain: Small infarction involving the left basal ganglia and adjacent white matter is again identified. There is no new loss of gray-white differentiation. There is no acute intracranial hemorrhage or mass effect. Stable findings of chronic microvascular ischemic changes and parenchymal volume loss. Vascular: No new findings. Skull: Unremarkable. Sinuses: No acute abnormality. Orbits: No acute abnormality Review of the MIP images confirms the above findings CTA NECK FINDINGS Aortic arch: Great vessel origins are patent. Right carotid system: Patent. Calcified plaque at the bifurcation. There is no measurable stenosis at the ICA origin. Left carotid system: Patent. No significant plaque or measurable stenosis at the ICA origin. Vertebral arteries: Patent and codominant. Skeleton: Multilevel cervical spine degenerative changes. Other neck: 1 cm left thyroid nodule.  No neck mass or adenopathy. Upper chest: Scarring at the lung apices. Review of the MIP  images confirms the above findings CTA HEAD FINDINGS Anterior circulation: Intracranial internal carotid arteries are patent with mild calcified plaque. Anterior and middle cerebral arteries are patent. There is multifocal atherosclerotic irregularity and stenoses. For example, moderate to severe short segment stenosis of the proximal right M1 MCA. Focal moderate stenosis of the left M1 MCA. Moderate to marked stenosis of the right A2 ACA. Additional stenoses are present more distally. Question of a 2 mm broad-based shallow inferiorly directed outpouching from the distal supraclinoid left ICA versus atherosclerotic irregularity. Posterior circulation: Intracranial vertebral arteries, basilar artery, and posterior cerebral arteries are patent. There is atherosclerotic irregularity and multifocal stenoses including diffuse mild to moderate stenosis of the right P2 PCA and least moderate stenosis at the left P2 PCA branch point. Venous sinuses: As permitted by contrast timing, patent. Review of the MIP images confirms the above findings IMPRESSION: Evolving small infarction of the left basal ganglia and adjacent white matter. No acute intracranial hemorrhage. Stable chronic findings detailed above. No hemodynamically significant stenosis in the neck. Multifocal intracranial atherosclerosis involving anterior and posterior circulations. Question of a 2 mm broad-based outpouching from the distal supraclinoid left ICA versus atherosclerotic irregularity. Electronically Signed   By: Macy Mis M.D.   On: 05/10/2019 18:35   MR BRAIN WO CONTRAST  Result Date: 05/10/2019 CLINICAL DATA:  Possible stroke with right-sided weakness EXAM: MRI HEAD WITHOUT CONTRAST TECHNIQUE: Multiplanar, multiecho pulse sequences of the brain and surrounding structures were obtained without intravenous contrast. COMPARISON:  None. FINDINGS: Brain: There is a small acute infarction involving the left basal ganglia and adjacent white matter.  No evidence of intracranial hemorrhage. There is no intracranial mass, mass effect, or edema. There is no hydrocephalus or extra-axial fluid collection.  Prominence of the ventricles and sulci reflects generalized parenchymal volume loss. Patchy and confluent areas of T2 hyperintensity in the supratentorial white matter are nonspecific but may reflect mild to moderate chronic microvascular ischemic changes. Vascular: Major vessel flow voids at the skull base are preserved. Skull and upper cervical spine: Normal marrow signal is preserved. Sinuses/Orbits: Trace mucosal thickening. Bilateral lens replacements. Other: Sella is unremarkable.  Mastoid air cells are clear. IMPRESSION: Small acute infarction involving the left basal ganglia and adjacent white matter. Chronic microvascular ischemic changes. Electronically Signed   By: Macy Mis M.D.   On: 05/10/2019 16:09   DG Chest Portable 1 View  Result Date: 05/10/2019 CLINICAL DATA:  New onset of right-sided weakness and slurred speech. EXAM: PORTABLE CHEST 1 VIEW COMPARISON:  Chest x-ray dated 11/30/2017 FINDINGS: The heart size and pulmonary vascularity are normal and the lungs are clear. Chronic bilateral apical pleural thickening. No significant bone abnormality. IMPRESSION: No acute abnormality. Electronically Signed   By: Lorriane Shire M.D.   On: 05/10/2019 13:22    Pending Labs Unresulted Labs (From admission, onward)    Start     Ordered   05/10/19 1703  Digoxin level  ONCE - STAT,   STAT     05/10/19 1702   05/10/19 1651  SARS CORONAVIRUS 2 (TAT 6-24 HRS) Nasopharyngeal Nasopharyngeal Swab  (Tier 3 (TAT 6-24 hrs))  Once,   STAT    Question Answer Comment  Is this test for diagnosis or screening Screening   Symptomatic for COVID-19 as defined by CDC No   Hospitalized for COVID-19 No   Admitted to ICU for COVID-19 No   Previously tested for COVID-19 No   Resident in a congregate (group) care setting No   Employed in healthcare setting No    Pregnant No      05/10/19 1650   Signed and Held  Hemoglobin A1c  Tomorrow morning,   R     Signed and Held   Signed and Held  Lipid panel  Tomorrow morning,   R    Comments: Fasting    Signed and Held   Signed and Held  CBC  (enoxaparin (LOVENOX)    CrCl >/= 30 ml/min)  Once,   R    Comments: Baseline for enoxaparin therapy IF NOT ALREADY DRAWN.  Notify MD if PLT < 100 K.    Signed and Held   Signed and Held  Creatinine, serum  (enoxaparin (LOVENOX)    CrCl >/= 30 ml/min)  Once,   R    Comments: Baseline for enoxaparin therapy IF NOT ALREADY DRAWN.    Signed and Held   Signed and Held  Creatinine, serum  (enoxaparin (LOVENOX)    CrCl >/= 30 ml/min)  Weekly,   R    Comments: while on enoxaparin therapy    Signed and Held   Signed and Held  TSH  Once,   R     Signed and Held          Vitals/Pain Today's Vitals   05/10/19 1446 05/10/19 1500 05/10/19 1853 05/10/19 1854  BP: (!) 152/79 (!) 148/82 (!) 144/108   Pulse: 75 78 88 92  Resp: 19 (!) 23    Temp:      TempSrc:      SpO2: 92% 95% 94% 97%  PainSc:        Isolation Precautions No active isolations  Medications Medications  aspirin chewable tablet 324 mg (324 mg Oral Not Given 05/10/19 1852)  iohexol (OMNIPAQUE) 350 MG/ML injection 100 mL (80 mLs Intravenous Contrast Given 05/10/19 1741)    Mobility non-ambulatory

## 2019-05-10 NOTE — Progress Notes (Signed)
Floor doing shift change. Receiving RN will call back when they can receive report. [facility] has been called.

## 2019-05-11 ENCOUNTER — Inpatient Hospital Stay (HOSPITAL_COMMUNITY): Payer: Medicare PPO

## 2019-05-11 ENCOUNTER — Encounter (HOSPITAL_COMMUNITY): Payer: Medicare HMO

## 2019-05-11 DIAGNOSIS — I10 Essential (primary) hypertension: Secondary | ICD-10-CM

## 2019-05-11 DIAGNOSIS — I6389 Other cerebral infarction: Secondary | ICD-10-CM

## 2019-05-11 DIAGNOSIS — I63312 Cerebral infarction due to thrombosis of left middle cerebral artery: Secondary | ICD-10-CM

## 2019-05-11 LAB — LIPID PANEL
Cholesterol: 247 mg/dL — ABNORMAL HIGH (ref 0–200)
HDL: 50 mg/dL (ref >40)
LDL Cholesterol: 178 mg/dL — ABNORMAL HIGH (ref 0–99)
Total CHOL/HDL Ratio: 4.9 RATIO
Triglycerides: 94 mg/dL (ref <150)
VLDL: 19 mg/dL (ref 0–40)

## 2019-05-11 LAB — ECHOCARDIOGRAM COMPLETE
Height: 64 in
Weight: 2338.64 oz

## 2019-05-11 LAB — SARS CORONAVIRUS 2 (TAT 6-24 HRS): SARS Coronavirus 2: NEGATIVE

## 2019-05-11 LAB — HEMOGLOBIN A1C
Hgb A1c MFr Bld: 5.6 % (ref 4.8–5.6)
Mean Plasma Glucose: 114.02 mg/dL

## 2019-05-11 MED ORDER — CLOPIDOGREL BISULFATE 75 MG PO TABS
300.0000 mg | ORAL_TABLET | Freq: Once | ORAL | Status: AC
  Administered 2019-05-12: 09:00:00 300 mg via ORAL
  Filled 2019-05-11: qty 4

## 2019-05-11 MED ORDER — ATORVASTATIN CALCIUM 40 MG PO TABS
40.0000 mg | ORAL_TABLET | Freq: Every day | ORAL | Status: DC
  Administered 2019-05-11 – 2019-05-13 (×3): 40 mg via ORAL
  Filled 2019-05-11: qty 1
  Filled 2019-05-11: qty 4
  Filled 2019-05-11 (×2): qty 1

## 2019-05-11 MED ORDER — CLOPIDOGREL BISULFATE 75 MG PO TABS: 75.0000 mg | ORAL_TABLET | Freq: Every day | ORAL | Status: DC

## 2019-05-11 NOTE — Evaluation (Signed)
Clinical/Bedside Swallow Evaluation Patient Details  Name: Melinda Edwards MRN: [id number] Date of Birth: 1926/11/19  Today's Date: 05/11/2019 Time: SLP Start Time (ACUTE ONLY): 0827 SLP Stop Time (ACUTE ONLY): 0841 SLP Time Calculation (min) (ACUTE ONLY): 14 min  Past Medical History:  Past Medical History:  Diagnosis Date  . Hyperlipidemia   . Hypertension   . PAC (premature atrial contraction)    BENIGH  . Palpitations   . Pulmonary nodule    Past Surgical History:  Past Surgical History:  Procedure Laterality Date  . BREAST EXCISIONAL BIOPSY Right 2003  . DILATION AND CURETTAGE OF UTERUS     HPI:  Pt is a 86 y.o. female with past medical history documented in the computer as hypertension, hyperlipidemia, who presented with complaints of right-sided weakness. MRI of the brain revealed small acute infarction involving the left basal ganglia and adjacent white matter. Pt failed Yale swallow screen due to coughing    Assessment / Plan / Recommendation Clinical Impression  Pt was seen for bedside swallow evaluation and she denied a history of dysphagia. Pt presented with a hearing impairment and repetition was often needed throughout the evaluation. Oral mechanism exam was limited due to pt's difficulty following some commands; however, oral motor strength and ROM appeared grossly WFL. She tolerated all solids and liquids without signs or symptoms of aspiration. However, an oral/pharyngeal delay is suspected considering time of presentation compared to time of hyolaryngeal elevation. SLP will follow to ensure diet tolerance.  SLP Visit Diagnosis: Dysphagia, unspecified (R13.10)    Aspiration Risk  No limitations    Diet Recommendation Regular;Thin liquid   Liquid Administration via: Straw;Cup Medication Administration: Crushed with puree Supervision: Patient able to self feed Postural Changes: Seated upright at 90 degrees    Other  Recommendations Oral Care Recommendations:  Oral care BID   Follow up Recommendations Other (comment)(TBD )      Frequency and Duration min 2x/week  1 week       Prognosis Prognosis for Safe Diet Advancement: Good      Swallow Study   General Date of Onset: 05/10/19 HPI: Pt is a 86 y.o. female with past medical history documented in the computer as hypertension, hyperlipidemia, who presented with complaints of right-sided weakness. MRI of the brain revealed small acute infarction involving the left basal ganglia and adjacent white matter. Pt failed Yale swallow screen due to coughing  Type of Study: Bedside Swallow Evaluation Previous Swallow Assessment: None Diet Prior to this Study: NPO Temperature Spikes Noted: No Respiratory Status: Room air History of Recent Intubation: No Behavior/Cognition: Alert;Cooperative Oral Cavity Assessment: Within Functional Limits Oral Care Completed by SLP: No Oral Cavity - Dentition: Adequate natural dentition Vision: Functional for self-feeding Self-Feeding Abilities: Able to feed self Patient Positioning: Upright in bed;Postural control adequate for testing Baseline Vocal Quality: Normal Volitional Swallow: Able to elicit    Oral/Motor/Sensory Function Overall Oral Motor/Sensory Function: Within functional limits   Ice Chips Ice chips: Within functional limits Presentation: Spoon   Thin Liquid Thin Liquid: Within functional limits Presentation: Cup;Spoon;Straw    Nectar Thick Nectar Thick Liquid: Not tested   Honey Thick Honey Thick Liquid: Not tested   Puree Puree: Within functional limits Presentation: Spoon   Solid     Solid: Within functional limits Presentation: [place] I. Hardin Negus, [place], [place] Office number [phone] Pager [phone]  Horton Marshall 05/11/2019,9:12 AM

## 2019-05-11 NOTE — Progress Notes (Signed)
Patient currently oriented x2, CSW made telephone call to relative [name] [phone] to initiate SNF assessment. There was no answer, CSW left a voicemail.  Criss Alvine, [place]

## 2019-05-11 NOTE — Evaluation (Signed)
Speech Language Pathology Evaluation Patient Details Name: Melinda Edwards MRN: [id number] DOB: 12/21/1926 Today's Date: 05/11/2019 Time: [id number] SLP Time Calculation (min) (ACUTE ONLY): 18 min  Problem List:  Patient Active Problem List   Diagnosis Date Noted  . Stroke ([place]) 05/10/2019  . Essential hypertension 05/10/2019  . Memory disorder 10/03/2013  . Nocturnal leg cramps 08/08/2012  . Aortic stenosis, mild 04/13/2011  . Benign hypertensive heart disease without heart failure 08/13/2010  . Hyperlipidemia 08/13/2010  . Palpitations 08/13/2010  . Bronchiectasis without acute exacerbation ([place]) 11/15/2007   Past Medical History:  Past Medical History:  Diagnosis Date  . Hyperlipidemia   . Hypertension   . PAC (premature atrial contraction)    BENIGH  . Palpitations   . Pulmonary nodule    Past Surgical History:  Past Surgical History:  Procedure Laterality Date  . BREAST EXCISIONAL BIOPSY Right 2003  . DILATION AND CURETTAGE OF UTERUS     HPI:  Pt is a 86 y.o. female with past medical history documented in the computer as hypertension, hyperlipidemia, who presented with complaints of right-sided weakness. MRI of the brain revealed small acute infarction involving the left basal ganglia and adjacent white matter. Pt failed Yale swallow screen due to coughing    Assessment / Plan / Recommendation Clinical Impression  Pt participated in speech/language evaluation with her friend and POA, Melinda Edwards, present. 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 She stated that the pt initially started having cognitive difficulty 10 years prior but it has gotten worse within the last six months. Per the POA, the pt is with her for most of the day and then is checked on every two hours at night but she does not have 24-hour supervision. She demonstrated cognitive-linguistic deficits in the areas of attention, orientation, memory,  problem solving, and executive function. Her speech and language skills are currently within functional limits; however, she did demonstrate some difficulty following commands due to hearing deficits combined with cognitive impairment. Pt's POA reported  She stated that her current speech, language, and cognitive skills are at baseline based on her performance during the evaluation. Further skilled SLP services are therefore not clinically indicated at this time for speech, language, or cognition but SLP will follow to ensure diet tolerance.     SLP Assessment  SLP Recommendation/Assessment: Patient does not need any further Speech Lanaguage Pathology Services(for speech, language or cognition; will follow for swallowin) SLP Visit Diagnosis: Cognitive communication deficit (R41.841)    Follow Up Recommendations  None    Frequency and Duration min 2x/week         SLP Evaluation Cognition  Overall Cognitive Status: History of cognitive impairments - at baseline Arousal/Alertness: Awake/alert Orientation Level: Oriented to person;Oriented to place;Disoriented to time;Disoriented to situation Attention: Sustained;Focused Focused Attention: Impaired Focused Attention Impairment: Verbal complex Sustained Attention: Impaired Sustained Attention Impairment: Verbal complex Memory: Impaired Memory Impairment: Storage deficit;Retrieval deficit Awareness: Impaired Awareness Impairment: Intellectual impairment Problem Solving: Impaired Problem Solving Impairment: Verbal complex       Comprehension  Auditory Comprehension Overall Auditory Comprehension: Appears within functional limits for tasks assessed Yes/No Questions: Within Functional Limits Commands: Impaired One Step Basic Commands: (3/3) Two Step Basic Commands: (0/4) Conversation: Simple Interfering Components: Attention;Hearing;Processing speed;Working memory EffectiveTechniques: Extra processing time;Repetition;Slowed  speech;Stressing words;Visual/Gestural cues Visual Recognition/Discrimination Discrimination: Exceptions to [facility] Reading Comprehension Reading Status: Not tested    Expression Expression Primary Mode of Expression: Verbal Verbal  Expression Overall Verbal Expression: Appears within functional limits for tasks assessed Initiation: No impairment Automatic Speech: Counting;Day of week;Month of year(WNL) Level of Generative/Spontaneous Verbalization: Conversation Repetition: No impairment Naming: No impairment Pragmatics: No impairment Interfering Components: Attention   Oral / Motor  Oral Motor/Sensory Function Overall Oral Motor/Sensory Function: Within functional limits Motor Speech Overall Motor Speech: Appears within functional limits for tasks assessed Respiration: Within functional limits Phonation: Normal Resonance: Within functional limits Articulation: Within functional limitis Intelligibility: Intelligible Motor Planning: Witnin functional limits Motor Speech Errors: Not applicable   Kerith Sherley I. Hardin Negus, [place], [place] Office number [phone] Pager [place] 05/11/2019, 9:27 AM

## 2019-05-11 NOTE — TOC Initial Note (Signed)
Transition of Care [facility]) - Initial/Assessment Note    Patient Details  Name: Melinda Edwards MRN: [id number] Date of Birth: [birth year]  Transition of [facility]) CM/SW Contact:    Arvella Merles, LCSW Phone Number: 05/11/2019, 5:28 PM  Clinical Narrative:                 CSW received consult for possible SNF placement at time of discharge. CSW spoke with patient's friend [name] [phone] at bedside regarding PT recommendation of SNF placement at time of discharge. Patient's friend expressed she wants patient to be admitted to CIR or back to ALF. Nurse, OT and CSW expressed patient's mobility exceeds CIR. Patient's friend requested CSW contact [facility] to see what they can offer.  CSW agreed to call [facility] to see about possible HH and supervision.   CSW contacted [facility] [phone] and spoke with [facility]. CSW reported PT recommendation, including 24/7 supervision. Varney Biles expressed she has enough staff to meet the needs of patient, which include 24/7 supervision. She stated prior to discharge she would need new fl2 & any notes.  CSW asked if patient would be able to discharge over the weekend if medically cleared, she stated patient would need to wait until Monday when they have a nurse on staff.   No further questions reported at this time. CSW to continue to follow and assist with discharge planning needs.   Expected Discharge Plan: Assisted Living Barriers to Discharge: Continued Medical Work up   Patient Goals and CMS Choice   CMS Medicare.gov Compare Post Acute Care list provided to:: Patient Represenative (must comment)(Patricia)    Expected Discharge Plan and Services Expected Discharge Plan: Assisted Living       Living arrangements for the past 2 months: [place]                                      Prior Living Arrangements/Services Living arrangements for the past 2 months: [place] Lives  with:: Self Patient language and need for interpreter reviewed:: Yes Do you feel safe going back to the place where you live?: Yes      Need for Family Participation in Patient Care: Yes (Comment) Care giver support system in place?: Yes (comment) Current home services: Home PT Criminal Activity/Legal Involvement Pertinent to Current Situation/Hospitalization: No - Comment as needed  Activities of Daily Living Home Assistive Devices/Equipment: Walker (specify type) ADL Screening (condition at time of admission) Patient's cognitive ability adequate to safely complete daily activities?: No Is the patient deaf or have difficulty hearing?: Yes Does the patient have difficulty seeing, even when wearing glasses/contacts?: No Does the patient have difficulty concentrating, remembering, or making decisions?: Yes Patient able to express need for assistance with ADLs?: Yes Does the patient have difficulty dressing or bathing?: No Independently performs ADLs?: Yes (appropriate for developmental age)(Requires assistance occassionally ) Does the patient have difficulty walking or climbing stairs?: Yes Weakness of Legs: Both Weakness of Arms/Hands: None  Permission Sought/Granted Permission sought to share information with : Family Supports, Customer service manager    Share Information with NAME: [name]  Permission granted to share info w AGENCY: ALF  Permission granted to share info w Relationship: Friend  Permission granted to share info w Contact Information: [phone]  Emotional Assessment Appearance:: Appears stated age   Affect (typically observed): Flat Orientation: : Oriented to Self, Oriented to Place  Alcohol / Substance Use: Not Applicable Psych Involvement: No (comment)  Admission diagnosis:  Stroke [facility]) [I63.9] Cerebrovascular accident (CVA), unspecified mechanism ([place]) [I63.9] Patient Active Problem List   Diagnosis Date Noted  . Stroke ([place]) 05/10/2019  .  Essential hypertension 05/10/2019  . Memory disorder 10/03/2013  . Nocturnal leg cramps 08/08/2012  . Aortic stenosis, mild 04/13/2011  . Benign hypertensive heart disease without heart failure 08/13/2010  . Hyperlipidemia 08/13/2010  . Palpitations 08/13/2010  . Bronchiectasis without acute exacerbation ([place]) 11/15/2007   PCP:  Lajean Manes, MD Pharmacy:   [facility] - [address] [place] [address] Phone: [phone] Fax: [phone] [place], [place] - [address] [place] [address] Phone: [phone] Fax: [phone]     Social Determinants of Health (SDOH) Interventions    Readmission Risk Interventions No flowsheet data found.

## 2019-05-11 NOTE — Evaluation (Signed)
Occupational Therapy Evaluation Patient Details Name: Melinda Edwards MRN: [id number] DOB: 04/24/1926 Today's Date: 05/11/2019    History of Present Illness Pt is a 86 yo female presenting due to onset of gradual R-sided weakness and fall at ALF. Imaging revealed small acute L basal ganglia infarct. PMH includes: HTN and HLD.   Clinical Impression   This 86 yo female admitted with above with PLOF of in ALF but could dress and toilet herself (did need S for showering) and ambulating without AD until recently when started with RW. She now presents to acute OT with min guard A when up on her feet with RW and overall S level due to memory defcits/safety concerns. She will best be benefit from returning to [place] with increased A/S since she is familiar with this environment and has a set routine. SNF would probably only confuse her more and she would not be able to see her friend Fraser Din.    Follow Up Recommendations  SNF(v. HHOT if someone can be with her 24/7 at least until they feel she is/can be left alone (safety is the concern))    Equipment Recommendations  None recommended by OT       Precautions / Restrictions Precautions Precautions: Fall Restrictions Weight Bearing Restrictions: No      Mobility Bed Mobility Overal bed mobility: Needs Assistance Bed Mobility: Supine to Sit;Sit to Supine     Supine to sit: Supervision;HOB elevated Sit to supine: Supervision   General bed mobility comments: S due to her for CVA and h/o dementia  Transfers Overall transfer level: Needs assistance Equipment used: Rolling walker (2 wheeled) Transfers: Sit to/from Stand Sit to Stand: Min guard         General transfer comment: Pt ambulated in room with RW to door and back to bed with mingaurd A manuvering RW well.    Balance Overall balance assessment: Needs assistance Sitting-balance support: No upper extremity supported;Feet supported Sitting balance-Leahy Scale:  Good Sitting balance - Comments: supervision needed for STM deficits and impulsivity     Standing balance-Leahy Scale: Poor Standing balance comment: needs at least one hand on AD or HHA for stability                           ADL either performed or assessed with clinical judgement   ADL Overall ADL's : Needs assistance/impaired Eating/Feeding: Independent;Sitting   Grooming: Set up;Supervision/safety;Sitting   Upper Body Bathing: Set up;Supervision/ safety;Sitting   Lower Body Bathing: Min guard;Sit to/from stand   Upper Body Dressing : Supervision/safety;Set up;Sitting   Lower Body Dressing: Min guard;Sit to/from stand   Toilet Transfer: Min guard;Ambulation;RW Toilet Transfer Details (indicate cue type and reason): bed>to door>bed Toileting- Water quality scientist and Hygiene: Min guard;Sit to/from stand               Vision Baseline Vision/History: Wears glasses Wears Glasses: At all times              Pertinent Vitals/Pain Pain Assessment: No/denies pain     Hand Dominance Right   Extremity/Trunk Assessment Upper Extremity Assessment Upper Extremity Assessment: Overall WFL for tasks assessed     Communication Communication Communication: HOH(hearing aids in room)   Cognition Arousal/Alertness: Awake/alert Behavior During Therapy: Impulsive;Flat affect Overall Cognitive Status: History of cognitive impairments - at baseline  General Comments: Pt followed all one step commands and was interactive with me and her friend Fraser Din in room when we engaged her, but she did not initiate conversation.              Home Living Family/patient expects to be discharged to:: Assisted living   Available Help at Discharge: Friend(s);Available PRN/intermittently Type of Home: Assisted living                       Home Equipment: None   Additional Comments: Karie Schwalbe (over 50 years, very close)  let her borrow her RW that Fraser Din does not use.  Lives With: Alone    Prior Functioning/Environment Level of Independence: Independent        Comments: Per Pat pt can bath herself (but someone is with her just in case), can dress herself, toilets herself, walks several times a day within the building, visits Pat in [place] living section of same building, makes her own bed, has a very set routine at [employer]        OT Problem List: Impaired balance (sitting and/or standing);Decreased safety awareness;Decreased cognition      OT Treatment/Interventions: Self-care/ADL training;Patient/family education;DME and/or AE instruction;Balance training    OT Goals(Current goals can be found in the care plan section) Acute Rehab OT Goals Patient Stated Goal: to be in bed OT Goal Formulation: Patient unable to participate in goal setting Time For Goal Achievement: 05/25/19 Potential to Achieve Goals: Good  OT Frequency: Min 2X/week   Barriers to D/C: Decreased caregiver support             AM-PAC OT "6 Clicks" Daily Activity     Outcome Measure Help from another person eating meals?: None Help from another person taking care of personal grooming?: A Little Help from another person toileting, which includes using toliet, bedpan, or urinal?: A Little Help from another person bathing (including washing, rinsing, drying)?: A Little Help from another person to put on and taking off regular upper body clothing?: A Little Help from another person to put on and taking off regular lower body clothing?: A Little 6 Click Score: 19   End of Session Equipment Utilized During Treatment: Gait belt;Rolling walker  Activity Tolerance: Patient tolerated treatment well Patient left: in bed;with call bell/phone within reach;with bed alarm set;with family/visitor present(Pat)  OT Visit Diagnosis: Unsteadiness on feet (R26.81);Muscle weakness (generalized) (M62.81);Other symptoms and signs involving  cognitive function                Time: [id number] OT Time Calculation (min): 31 min Charges:  OT General Charges $OT Visit: 1 Visit OT Evaluation $OT Eval Moderate Complexity: 1 Mod OT Treatments $Self Care/Home Management : 8-22 mins  [facility] OTR/L Acute [employer] Pager [phone] Office [phone]     05/11/2019, 3:55 PM

## 2019-05-11 NOTE — Progress Notes (Signed)
POA called and updated. All questions answered. No concerns per POA.

## 2019-05-11 NOTE — Progress Notes (Signed)
[facility] PROGRESS NOTE    Progress Note  Melinda Edwards  [id number] DOB: 1926-04-25 DOA: 05/10/2019 PCP: Lajean Manes, MD     Brief Narrative:   Melinda Edwards is an 86 y.o. female with past medical history of hypertension hyperlipidemia palpitation not on medication who comes into the hospital complaining of right-sided weakness.  On the day of admission she was weak and with a slurred speech last time she was seen normal was the day prior she was brought into the ED and found to have a CVA  Assessment/Plan:   Stroke [facility]) Patient is not a candidate for TPA. HgbA1c 5.6, fasting lipid panel and LDL greater than 150. MRI, MRA of the brain without contrast infarct on the left basal ganglia PT, OT, Speech consult pending CT angio of the neck showed no significant stenosis Neurology was consulted Transthoracic Echo pending  Start patient on ASA 81mg  daily and plavix 75mg  daily  Atorvastatin  high-dose BP goal: permissive HTN upto 220/120 mmHg Telemetry monitoring with no events  Hyperlipidemia: Statins therpay.  History of essential hypertension on no medications: We will allow permissive hypertension on discharge if her blood pressures greater than 160/90 we evaluate her as an outpatient with her PCP and if it continues to be elevated she could be started on antihypertensive medication as an outpatient.  Memory disorder/history of dementia: Noted  Palpitations: No events on telemetry.  DVT prophylaxis: lovenox Family Communication:Friend and HCPOA Disposition Plan/Barrier to D/C: CIR versus skilled nursing facility once physical therapy evaluation done and work-up completed.  She came from an assisted living facility.  Code Status:     Code Status Orders  (From admission, onward)         Start     Ordered   05/10/19 2032  Do not attempt resuscitation (DNR)  Continuous    Question Answer Comment  In the event of cardiac or respiratory ARREST Do not  call a "code blue"   In the event of cardiac or respiratory ARREST Do not perform Intubation, CPR, defibrillation or ACLS   In the event of cardiac or respiratory ARREST Use medication by any route, position, wound care, and other measures to relive pain and suffering. May use oxygen, suction and manual treatment of airway obstruction as needed for comfort.      05/10/19 2031        Code Status History    Date Active Date Inactive Code Status Order ID Comments User Context   05/10/2019 2022 05/10/2019 2031 DNR [id number]  Flora Lipps, MD Inpatient   Advance Care Planning Activity        IV Access:    Peripheral IV   Procedures and diagnostic studies:   CT Angio Head W or Wo Contrast  Result Date: 05/10/2019 CLINICAL DATA:  Acute infarct, follow-up EXAM: CT ANGIOGRAPHY HEAD AND NECK TECHNIQUE: Multidetector CT imaging of the head and neck was performed using the standard protocol during bolus administration of intravenous contrast. Multiplanar CT image reconstructions and MIPs were obtained to evaluate the vascular anatomy. Carotid stenosis measurements (when applicable) are obtained utilizing NASCET criteria, using the distal internal carotid diameter as the denominator. CONTRAST:  71mL OMNIPAQUE IOHEXOL 350 MG/ML SOLN COMPARISON:  Earlier same day CT head FINDINGS: CT HEAD FINDINGS Brain: Small infarction involving the left basal ganglia and adjacent white matter is again identified. There is no new loss of gray-white differentiation. There is no acute intracranial hemorrhage or mass effect. Stable findings of chronic microvascular ischemic  changes and parenchymal volume loss. Vascular: No new findings. Skull: Unremarkable. Sinuses: No acute abnormality. Orbits: No acute abnormality Review of the MIP images confirms the above findings CTA NECK FINDINGS Aortic arch: Great vessel origins are patent. Right carotid system: Patent. Calcified plaque at the bifurcation. There is no measurable  stenosis at the ICA origin. Left carotid system: Patent. No significant plaque or measurable stenosis at the ICA origin. Vertebral arteries: Patent and codominant. Skeleton: Multilevel cervical spine degenerative changes. Other neck: 1 cm left thyroid nodule.  No neck mass or adenopathy. Upper chest: Scarring at the lung apices. Review of the MIP images confirms the above findings CTA HEAD FINDINGS Anterior circulation: Intracranial internal carotid arteries are patent with mild calcified plaque. Anterior and middle cerebral arteries are patent. There is multifocal atherosclerotic irregularity and stenoses. For example, moderate to severe short segment stenosis of the proximal right M1 MCA. Focal moderate stenosis of the left M1 MCA. Moderate to marked stenosis of the right A2 ACA. Additional stenoses are present more distally. Question of a 2 mm broad-based shallow inferiorly directed outpouching from the distal supraclinoid left ICA versus atherosclerotic irregularity. Posterior circulation: Intracranial vertebral arteries, basilar artery, and posterior cerebral arteries are patent. There is atherosclerotic irregularity and multifocal stenoses including diffuse mild to moderate stenosis of the right P2 PCA and least moderate stenosis at the left P2 PCA branch point. Venous sinuses: As permitted by contrast timing, patent. Review of the MIP images confirms the above findings IMPRESSION: Evolving small infarction of the left basal ganglia and adjacent white matter. No acute intracranial hemorrhage. Stable chronic findings detailed above. No hemodynamically significant stenosis in the neck. Multifocal intracranial atherosclerosis involving anterior and posterior circulations. Question of a 2 mm broad-based outpouching from the distal supraclinoid left ICA versus atherosclerotic irregularity. Electronically Signed   By: Macy Mis M.D.   On: 05/10/2019 18:35   CT HEAD WO CONTRAST  Result Date:  05/10/2019 CLINICAL DATA:  Right-sided weakness. EXAM: CT HEAD WITHOUT CONTRAST TECHNIQUE: Contiguous axial images were obtained from the base of the skull through the vertex without intravenous contrast. COMPARISON:  None. FINDINGS: Brain: Mild diffuse cortical atrophy is noted. Mild chronic ischemic white matter disease is noted. No mass effect or midline shift is noted. Ventricular size is within normal limits. There is no evidence of mass lesion, hemorrhage or acute infarction. Vascular: No hyperdense vessel or unexpected calcification. Skull: Normal. Negative for fracture or focal lesion. Sinuses/Orbits: No acute finding. Other: None. IMPRESSION: Mild diffuse cortical atrophy. Mild chronic ischemic white matter disease. No acute intracranial abnormality seen. Electronically Signed   By: Marijo Conception M.D.   On: 05/10/2019 12:56   CT Angio Neck W and/or Wo Contrast  Result Date: 05/10/2019 CLINICAL DATA:  Acute infarct, follow-up EXAM: CT ANGIOGRAPHY HEAD AND NECK TECHNIQUE: Multidetector CT imaging of the head and neck was performed using the standard protocol during bolus administration of intravenous contrast. Multiplanar CT image reconstructions and MIPs were obtained to evaluate the vascular anatomy. Carotid stenosis measurements (when applicable) are obtained utilizing NASCET criteria, using the distal internal carotid diameter as the denominator. CONTRAST:  74mL OMNIPAQUE IOHEXOL 350 MG/ML SOLN COMPARISON:  Earlier same day CT head FINDINGS: CT HEAD FINDINGS Brain: Small infarction involving the left basal ganglia and adjacent white matter is again identified. There is no new loss of gray-white differentiation. There is no acute intracranial hemorrhage or mass effect. Stable findings of chronic microvascular ischemic changes and parenchymal volume loss. Vascular: No new  findings. Skull: Unremarkable. Sinuses: No acute abnormality. Orbits: No acute abnormality Review of the MIP images confirms the  above findings CTA NECK FINDINGS Aortic arch: Great vessel origins are patent. Right carotid system: Patent. Calcified plaque at the bifurcation. There is no measurable stenosis at the ICA origin. Left carotid system: Patent. No significant plaque or measurable stenosis at the ICA origin. Vertebral arteries: Patent and codominant. Skeleton: Multilevel cervical spine degenerative changes. Other neck: 1 cm left thyroid nodule.  No neck mass or adenopathy. Upper chest: Scarring at the lung apices. Review of the MIP images confirms the above findings CTA HEAD FINDINGS Anterior circulation: Intracranial internal carotid arteries are patent with mild calcified plaque. Anterior and middle cerebral arteries are patent. There is multifocal atherosclerotic irregularity and stenoses. For example, moderate to severe short segment stenosis of the proximal right M1 MCA. Focal moderate stenosis of the left M1 MCA. Moderate to marked stenosis of the right A2 ACA. Additional stenoses are present more distally. Question of a 2 mm broad-based shallow inferiorly directed outpouching from the distal supraclinoid left ICA versus atherosclerotic irregularity. Posterior circulation: Intracranial vertebral arteries, basilar artery, and posterior cerebral arteries are patent. There is atherosclerotic irregularity and multifocal stenoses including diffuse mild to moderate stenosis of the right P2 PCA and least moderate stenosis at the left P2 PCA branch point. Venous sinuses: As permitted by contrast timing, patent. Review of the MIP images confirms the above findings IMPRESSION: Evolving small infarction of the left basal ganglia and adjacent white matter. No acute intracranial hemorrhage. Stable chronic findings detailed above. No hemodynamically significant stenosis in the neck. Multifocal intracranial atherosclerosis involving anterior and posterior circulations. Question of a 2 mm broad-based outpouching from the distal supraclinoid left  ICA versus atherosclerotic irregularity. Electronically Signed   By: Macy Mis M.D.   On: 05/10/2019 18:35   MR BRAIN WO CONTRAST  Result Date: 05/10/2019 CLINICAL DATA:  Possible stroke with right-sided weakness EXAM: MRI HEAD WITHOUT CONTRAST TECHNIQUE: Multiplanar, multiecho pulse sequences of the brain and surrounding structures were obtained without intravenous contrast. COMPARISON:  None. FINDINGS: Brain: There is a small acute infarction involving the left basal ganglia and adjacent white matter. No evidence of intracranial hemorrhage. There is no intracranial mass, mass effect, or edema. There is no hydrocephalus or extra-axial fluid collection. Prominence of the ventricles and sulci reflects generalized parenchymal volume loss. Patchy and confluent areas of T2 hyperintensity in the supratentorial white matter are nonspecific but may reflect mild to moderate chronic microvascular ischemic changes. Vascular: Major vessel flow voids at the skull base are preserved. Skull and upper cervical spine: Normal marrow signal is preserved. Sinuses/Orbits: Trace mucosal thickening. Bilateral lens replacements. Other: Sella is unremarkable.  Mastoid air cells are clear. IMPRESSION: Small acute infarction involving the left basal ganglia and adjacent white matter. Chronic microvascular ischemic changes. Electronically Signed   By: Macy Mis M.D.   On: 05/10/2019 16:09   DG Chest Portable 1 View  Result Date: 05/10/2019 CLINICAL DATA:  New onset of right-sided weakness and slurred speech. EXAM: PORTABLE CHEST 1 VIEW COMPARISON:  Chest x-ray dated 11/30/2017 FINDINGS: The heart size and pulmonary vascularity are normal and the lungs are clear. Chronic bilateral apical pleural thickening. No significant bone abnormality. IMPRESSION: No acute abnormality. Electronically Signed   By: Lorriane Shire M.D.   On: 05/10/2019 13:22     Medical Consultants:    None.  Anti-Infectives:   None  Subjective:     Melinda Edwards she relates  she feels still a little bit drowsy.  Objective:    Vitals:   05/10/19 2031 05/11/19 0001 05/11/19 0431 05/11/19 0845  BP: (!) 171/104 (!) 148/97 (!) 151/78 (!) 140/55  Pulse: (!) 101 82 81 (!) 43  Resp: 18 16 15 17   Temp: 97.6 F (36.4 C) 97.7 F (36.5 C) 97.8 F (36.6 C) (!) 97.5 F (36.4 C)  TempSrc: Oral Oral Oral Oral  SpO2: 95% 95% 93% 97%  Weight: 66.3 kg     Height: 5\' 4"  (1.626 m)      SpO2: 97 % O2 Flow Rate (L/min): 0 L/min FiO2 (%): 21 %   Intake/Output Summary (Last 24 hours) at 05/11/2019 0854 Last data filed at 05/11/2019 0300 Gross per 24 hour  Intake 230.8 ml  Output --  Net 230.8 ml   Filed Weights   05/10/19 2031  Weight: 66.3 kg    Exam: General exam: In no acute distress. Respiratory system: Good air movement and clear to auscultation. Cardiovascular system: S1 & S2 heard, RRR. No JVD. Gastrointestinal system: Abdomen is nondistended, soft and nontender.  Central nervous system: Alert and oriented.  Slow to respond weak in her right leg and right arm Extremities: No pedal edema. Skin: No rashes, lesions or ulcers Psychiatry: Judgement and insight appear normal.  Data Reviewed:    Labs: Basic Metabolic Panel: Recent Labs  Lab 05/10/19 1237 05/10/19 2048  NA 142  --   K 3.7  --   CL 108  --   CO2 23  --   GLUCOSE 132*  --   BUN 19  --   CREATININE 0.78 0.66  CALCIUM 9.1  --    GFR Estimated Creatinine Clearance: 43.8 mL/min (by C-G formula based on SCr of 0.66 mg/dL). Liver Function Tests: Recent Labs  Lab 05/10/19 1237  AST 16  ALT 14  ALKPHOS 77  BILITOT 1.1  PROT 7.8  ALBUMIN 4.1   No results for input(s): LIPASE, AMYLASE in the last 168 hours. No results for input(s): AMMONIA in the last 168 hours. Coagulation profile Recent Labs  Lab 05/10/19 1237  INR 1.0   COVID-19 Labs  No results for input(s): DDIMER, FERRITIN, LDH, CRP in the last 72 hours.  Lab Results  Component  Value Date   [place] NEGATIVE 05/10/2019    CBC: Recent Labs  Lab 05/10/19 1237  WBC 8.9  NEUTROABS 6.1  HGB 13.8  HCT 42.9  MCV 86.8  PLT 221   Cardiac Enzymes: No results for input(s): CKTOTAL, CKMB, CKMBINDEX, TROPONINI in the last 168 hours. BNP (last 3 results) No results for input(s): PROBNP in the last 8760 hours. CBG: No results for input(s): GLUCAP in the last 168 hours. D-Dimer: No results for input(s): DDIMER in the last 72 hours. Hgb A1c: Recent Labs    05/11/19 0308  HGBA1C 5.6   Lipid Profile: Recent Labs    05/11/19 0308  CHOL 247*  HDL 50  LDLCALC 178*  TRIG 94  CHOLHDL 4.9   Thyroid function studies: Recent Labs    05/10/19 2048  TSH 3.837   Anemia work up: No results for input(s): VITAMINB12, FOLATE, FERRITIN, TIBC, IRON, RETICCTPCT in the last 72 hours. Sepsis Labs: Recent Labs  Lab 05/10/19 1237  WBC 8.9   Microbiology Recent Results (from the past 240 hour(s))  SARS CORONAVIRUS 2 (TAT 6-24 HRS) Nasopharyngeal Nasopharyngeal Swab     Status: None   Collection Time: 05/10/19  6:09 PM   Specimen: Nasopharyngeal Swab  Result Value Ref Range Status   SARS Coronavirus 2 NEGATIVE NEGATIVE Final    Comment: (NOTE) SARS-CoV-2 target nucleic acids are NOT DETECTED. The SARS-CoV-2 RNA is generally detectable in upper and lower respiratory specimens during the acute phase of infection. Negative results do not preclude SARS-CoV-2 infection, do not rule out co-infections with other pathogens, and should not be used as the sole basis for treatment or other patient management decisions. Negative results must be combined with clinical observations, patient history, and epidemiological information. The expected result is Negative. Fact Sheet for Patients: SugarRoll.be Fact Sheet for Healthcare Providers: https://www.woods-mathews.com/ This test is not yet approved or cleared by the [place]  FDA and  has been authorized for detection and/or diagnosis of SARS-CoV-2 by FDA under an Emergency Use Authorization (EUA). This EUA will remain  in effect (meaning this test can be used) for the duration of the COVID-19 declaration under Section 56 4(b)(1) of the Act, 21 U.S.C. section 360bbb-3(b)(1), unless the authorization is terminated or revoked sooner. Performed at [facility], [place] [address] [place], [place] [address]      Medications:   . aspirin  324 mg Oral Once  . aspirin EC  81 mg Oral Daily  . atorvastatin  40 mg Oral q1800  . clopidogrel  300 mg Oral Once  . clopidogrel  75 mg Oral Daily  . enoxaparin (LOVENOX) injection  40 mg Subcutaneous Q24H   Continuous Infusions: . sodium chloride 50 mL/hr at 05/11/19 0620      LOS: 1 day   Charlynne Cousins  [facility]  05/11/2019, 8:54 AM

## 2019-05-11 NOTE — Progress Notes (Signed)
STROKE TEAM PROGRESS NOTE   INTERVAL HISTORY Daughter at bedside.  Patient lying in bed, awake alert, not fully orientated, still has mild right facial droop and right leg weakness.  Right upper extremity is strong.  PT/OT recommend SNF versus home health.  Vitals:   05/11/19 0431 05/11/19 0845 05/11/19 1210 05/11/19 1411  BP: (!) 151/78 (!) 140/55 (!) 158/89   Pulse: 81 (!) 43 77 80  Resp: 15 17 20    Temp: 97.8 F (36.6 C) (!) 97.5 F (36.4 C) 98 F (36.7 C)   TempSrc: Oral Oral Oral   SpO2: 93% 97% 95% 98%  Weight:      Height:        CBC:  Recent Labs  Lab 05/10/19 1237  WBC 8.9  NEUTROABS 6.1  HGB 13.8  HCT 42.9  MCV 86.8  PLT [id number]    Basic Metabolic Panel:  Recent Labs  Lab 05/10/19 1237 05/10/19 2048  NA 142  --   K 3.7  --   CL 108  --   CO2 23  --   GLUCOSE 132*  --   BUN 19  --   CREATININE 0.78 0.66  CALCIUM 9.1  --    Lipid Panel:     Component Value Date/Time   CHOL 247 (H) 05/11/2019 0308   TRIG 94 05/11/2019 0308   HDL 50 05/11/2019 0308   CHOLHDL 4.9 05/11/2019 0308   VLDL 19 05/11/2019 0308   LDLCALC 178 (H) 05/11/2019 0308   HgbA1c:  Lab Results  Component Value Date   HGBA1C 5.6 05/11/2019   Urine Drug Screen:     Component Value Date/Time   LABOPIA NONE DETECTED 05/10/2019 1219   COCAINSCRNUR NONE DETECTED 05/10/2019 1219   LABBENZ NONE DETECTED 05/10/2019 1219   AMPHETMU NONE DETECTED 05/10/2019 1219   THCU NONE DETECTED 05/10/2019 1219   LABBARB NONE DETECTED 05/10/2019 1219    Alcohol Level     Component Value Date/Time   ETH <10 05/10/2019 1237    IMAGING past 48 hours CT Angio Head W or Wo Contrast  Result Date: 05/10/2019 CLINICAL DATA:  Acute infarct, follow-up EXAM: CT ANGIOGRAPHY HEAD AND NECK TECHNIQUE: Multidetector CT imaging of the head and neck was performed using the standard protocol during bolus administration of intravenous contrast. Multiplanar CT image reconstructions and MIPs were obtained to  evaluate the vascular anatomy. Carotid stenosis measurements (when applicable) are obtained utilizing NASCET criteria, using the distal internal carotid diameter as the denominator. CONTRAST:  69mL OMNIPAQUE IOHEXOL 350 MG/ML SOLN COMPARISON:  Earlier same day CT head FINDINGS: CT HEAD FINDINGS Brain: Small infarction involving the left basal ganglia and adjacent white matter is again identified. There is no new loss of gray-white differentiation. There is no acute intracranial hemorrhage or mass effect. Stable findings of chronic microvascular ischemic changes and parenchymal volume loss. Vascular: No new findings. Skull: Unremarkable. Sinuses: No acute abnormality. Orbits: No acute abnormality Review of the MIP images confirms the above findings CTA NECK FINDINGS Aortic arch: Great vessel origins are patent. Right carotid system: Patent. Calcified plaque at the bifurcation. There is no measurable stenosis at the ICA origin. Left carotid system: Patent. No significant plaque or measurable stenosis at the ICA origin. Vertebral arteries: Patent and codominant. Skeleton: Multilevel cervical spine degenerative changes. Other neck: 1 cm left thyroid nodule.  No neck mass or adenopathy. Upper chest: Scarring at the lung apices. Review of the MIP images confirms the above findings CTA HEAD FINDINGS Anterior circulation:  Intracranial internal carotid arteries are patent with mild calcified plaque. Anterior and middle cerebral arteries are patent. There is multifocal atherosclerotic irregularity and stenoses. For example, moderate to severe short segment stenosis of the proximal right M1 MCA. Focal moderate stenosis of the left M1 MCA. Moderate to marked stenosis of the right A2 ACA. Additional stenoses are present more distally. Question of a 2 mm broad-based shallow inferiorly directed outpouching from the distal supraclinoid left ICA versus atherosclerotic irregularity. Posterior circulation: Intracranial vertebral  arteries, basilar artery, and posterior cerebral arteries are patent. There is atherosclerotic irregularity and multifocal stenoses including diffuse mild to moderate stenosis of the right P2 PCA and least moderate stenosis at the left P2 PCA branch point. Venous sinuses: As permitted by contrast timing, patent. Review of the MIP images confirms the above findings IMPRESSION: Evolving small infarction of the left basal ganglia and adjacent white matter. No acute intracranial hemorrhage. Stable chronic findings detailed above. No hemodynamically significant stenosis in the neck. Multifocal intracranial atherosclerosis involving anterior and posterior circulations. Question of a 2 mm broad-based outpouching from the distal supraclinoid left ICA versus atherosclerotic irregularity. Electronically Signed   By: Macy Mis M.D.   On: 05/10/2019 18:35   CT HEAD WO CONTRAST  Result Date: 05/10/2019 CLINICAL DATA:  Right-sided weakness. EXAM: CT HEAD WITHOUT CONTRAST TECHNIQUE: Contiguous axial images were obtained from the base of the skull through the vertex without intravenous contrast. COMPARISON:  None. FINDINGS: Brain: Mild diffuse cortical atrophy is noted. Mild chronic ischemic white matter disease is noted. No mass effect or midline shift is noted. Ventricular size is within normal limits. There is no evidence of mass lesion, hemorrhage or acute infarction. Vascular: No hyperdense vessel or unexpected calcification. Skull: Normal. Negative for fracture or focal lesion. Sinuses/Orbits: No acute finding. Other: None. IMPRESSION: Mild diffuse cortical atrophy. Mild chronic ischemic white matter disease. No acute intracranial abnormality seen. Electronically Signed   By: Marijo Conception M.D.   On: 05/10/2019 12:56   CT Angio Neck W and/or Wo Contrast  Result Date: 05/10/2019 CLINICAL DATA:  Acute infarct, follow-up EXAM: CT ANGIOGRAPHY HEAD AND NECK TECHNIQUE: Multidetector CT imaging of the head and neck  was performed using the standard protocol during bolus administration of intravenous contrast. Multiplanar CT image reconstructions and MIPs were obtained to evaluate the vascular anatomy. Carotid stenosis measurements (when applicable) are obtained utilizing NASCET criteria, using the distal internal carotid diameter as the denominator. CONTRAST:  22mL OMNIPAQUE IOHEXOL 350 MG/ML SOLN COMPARISON:  Earlier same day CT head FINDINGS: CT HEAD FINDINGS Brain: Small infarction involving the left basal ganglia and adjacent white matter is again identified. There is no new loss of gray-white differentiation. There is no acute intracranial hemorrhage or mass effect. Stable findings of chronic microvascular ischemic changes and parenchymal volume loss. Vascular: No new findings. Skull: Unremarkable. Sinuses: No acute abnormality. Orbits: No acute abnormality Review of the MIP images confirms the above findings CTA NECK FINDINGS Aortic arch: Great vessel origins are patent. Right carotid system: Patent. Calcified plaque at the bifurcation. There is no measurable stenosis at the ICA origin. Left carotid system: Patent. No significant plaque or measurable stenosis at the ICA origin. Vertebral arteries: Patent and codominant. Skeleton: Multilevel cervical spine degenerative changes. Other neck: 1 cm left thyroid nodule.  No neck mass or adenopathy. Upper chest: Scarring at the lung apices. Review of the MIP images confirms the above findings CTA HEAD FINDINGS Anterior circulation: Intracranial internal carotid arteries are patent with mild  calcified plaque. Anterior and middle cerebral arteries are patent. There is multifocal atherosclerotic irregularity and stenoses. For example, moderate to severe short segment stenosis of the proximal right M1 MCA. Focal moderate stenosis of the left M1 MCA. Moderate to marked stenosis of the right A2 ACA. Additional stenoses are present more distally. Question of a 2 mm broad-based shallow  inferiorly directed outpouching from the distal supraclinoid left ICA versus atherosclerotic irregularity. Posterior circulation: Intracranial vertebral arteries, basilar artery, and posterior cerebral arteries are patent. There is atherosclerotic irregularity and multifocal stenoses including diffuse mild to moderate stenosis of the right P2 PCA and least moderate stenosis at the left P2 PCA branch point. Venous sinuses: As permitted by contrast timing, patent. Review of the MIP images confirms the above findings IMPRESSION: Evolving small infarction of the left basal ganglia and adjacent white matter. No acute intracranial hemorrhage. Stable chronic findings detailed above. No hemodynamically significant stenosis in the neck. Multifocal intracranial atherosclerosis involving anterior and posterior circulations. Question of a 2 mm broad-based outpouching from the distal supraclinoid left ICA versus atherosclerotic irregularity. Electronically Signed   By: Macy Mis M.D.   On: 05/10/2019 18:35   MR BRAIN WO CONTRAST  Result Date: 05/10/2019 CLINICAL DATA:  Possible stroke with right-sided weakness EXAM: MRI HEAD WITHOUT CONTRAST TECHNIQUE: Multiplanar, multiecho pulse sequences of the brain and surrounding structures were obtained without intravenous contrast. COMPARISON:  None. FINDINGS: Brain: There is a small acute infarction involving the left basal ganglia and adjacent white matter. No evidence of intracranial hemorrhage. There is no intracranial mass, mass effect, or edema. There is no hydrocephalus or extra-axial fluid collection. Prominence of the ventricles and sulci reflects generalized parenchymal volume loss. Patchy and confluent areas of T2 hyperintensity in the supratentorial white matter are nonspecific but may reflect mild to moderate chronic microvascular ischemic changes. Vascular: Major vessel flow voids at the skull base are preserved. Skull and upper cervical spine: Normal marrow signal  is preserved. Sinuses/Orbits: Trace mucosal thickening. Bilateral lens replacements. Other: Sella is unremarkable.  Mastoid air cells are clear. IMPRESSION: Small acute infarction involving the left basal ganglia and adjacent white matter. Chronic microvascular ischemic changes. Electronically Signed   By: Macy Mis M.D.   On: 05/10/2019 16:09   DG Chest Portable 1 View  Result Date: 05/10/2019 CLINICAL DATA:  New onset of right-sided weakness and slurred speech. EXAM: PORTABLE CHEST 1 VIEW COMPARISON:  Chest x-ray dated 11/30/2017 FINDINGS: The heart size and pulmonary vascularity are normal and the lungs are clear. Chronic bilateral apical pleural thickening. No significant bone abnormality. IMPRESSION: No acute abnormality. Electronically Signed   By: Lorriane Shire M.D.   On: 05/10/2019 13:22   ECHOCARDIOGRAM COMPLETE  Result Date: 05/11/2019    ECHOCARDIOGRAM REPORT   Patient Name:   Melinda Edwards Mignano Date of Exam: 05/11/2019 Medical Rec #:  [id number]       Height:       64.0 in Accession #:    [id number]      Weight:       146.2 lb Date of Birth:  October 07, 1926        BSA:          1.712 m Patient Age:    86 years        BP:           140/55 mmHg Patient Gender: F               HR:  45 bpm. Exam Location:  Inpatient Procedure: 2D Echo Indications:    Stroke 434.91 / I163.9  History:        Patient has no prior history of Echocardiogram examinations.                 COPD, Arrythmias:Palpitations; Risk Factors:Hypertension and                 Dyslipidemia.  Sonographer:    Vikki Ports Turrentine Referring Phys: [id number] [place]  1. Left ventricular ejection fraction, by estimation, is 60 to 65%. The left ventricle has normal function. The left ventricle has no regional wall motion abnormalities. There is mild asymmetric left ventricular hypertrophy of the basal-septal segment. Left ventricular diastolic parameters are consistent with Grade I diastolic dysfunction (impaired  relaxation). Elevated left ventricular end-diastolic pressure.  2. Right ventricular systolic function is normal. The right ventricular size is normal. There is mildly elevated pulmonary artery systolic pressure.  3. Left atrial size was mildly dilated.  4. The mitral valve is normal in structure and function. No evidence of mitral valve regurgitation. No evidence of mitral stenosis.  5. The aortic valve is tricuspid. Aortic valve regurgitation is mild. Mild to moderate aortic valve sclerosis/calcification is present, without any evidence of aortic stenosis.  6. The inferior vena cava is normal in size with greater than 50% respiratory variability, suggesting right atrial pressure of 3 mmHg. FINDINGS  Left Ventricle: Left ventricular ejection fraction, by estimation, is 60 to 65%. The left ventricle has normal function. The left ventricle has no regional wall motion abnormalities. The left ventricular internal cavity size was normal in size. There is  mild asymmetric left ventricular hypertrophy of the basal-septal segment. Left ventricular diastolic parameters are consistent with Grade I diastolic dysfunction (impaired relaxation). Elevated left ventricular end-diastolic pressure. Right Ventricle: The right ventricular size is normal. No increase in right ventricular wall thickness. Right ventricular systolic function is normal. There is mildly elevated pulmonary artery systolic pressure. The tricuspid regurgitant velocity is 2.92  m/s, and with an assumed right atrial pressure of 3 mmHg, the estimated right ventricular systolic pressure is [id number] mmHg. Left Atrium: Left atrial size was mildly dilated. Right Atrium: Right atrial size was normal in size. Pericardium: There is no evidence of pericardial effusion. Mitral Valve: The mitral valve is normal in structure and function. Normal mobility of the mitral valve leaflets. Moderate mitral annular calcification. No evidence of mitral valve regurgitation. No evidence  of mitral valve stenosis. Tricuspid Valve: The tricuspid valve is normal in structure. Tricuspid valve regurgitation is mild . No evidence of tricuspid stenosis. Aortic Valve: The aortic valve is tricuspid. Aortic valve regurgitation is mild. Mild to moderate aortic valve sclerosis/calcification is present, without any evidence of aortic stenosis. Aortic valve mean gradient measures 7.0 mmHg. Aortic valve peak gradient measures 12.5 mmHg. Aortic valve area, by VTI measures 1.58 cm. Pulmonic Valve: The pulmonic valve was normal in structure. Pulmonic valve regurgitation is not visualized. No evidence of pulmonic stenosis. Aorta: The aortic root is normal in size and structure. Venous: The inferior vena cava is normal in size with greater than 50% respiratory variability, suggesting right atrial pressure of 3 mmHg. IAS/Shunts: No atrial level shunt detected by color flow Doppler.  LEFT VENTRICLE PLAX 2D LVIDd:         3.20 cm  Diastology LVIDs:         2.40 cm  LV e' lateral:   5.44 cm/s LV PW:  1.10 cm  LV E/e' lateral: 15.6 LV IVS:        1.10 cm  LV e' medial:    5.11 cm/s LVOT diam:     1.90 cm  LV E/e' medial:  16.6 LV SV:         62 LV SV Index:   36 LVOT Area:     2.84 cm  RIGHT VENTRICLE RV S prime:     18.80 cm/s TAPSE (M-mode): 1.8 cm LEFT ATRIUM             Index       RIGHT ATRIUM           Index LA diam:        3.80 cm 2.22 cm/m  RA Area:     11.40 cm LA Vol (A2C):   38.9 ml 22.72 ml/m RA Volume:   23.20 ml  13.55 ml/m LA Vol (A4C):   39.0 ml 22.78 ml/m LA Biplane Vol: 39.7 ml 23.19 ml/m  AORTIC VALVE AV Area (Vmax):    1.83 cm AV Area (Vmean):   1.67 cm AV Area (VTI):     1.58 cm AV Vmax:           177.00 cm/s AV Vmean:          118.000 cm/s AV VTI:            0.390 m AV Peak Grad:      12.5 mmHg AV Mean Grad:      7.0 mmHg LVOT Vmax:         114.00 cm/s LVOT Vmean:        69.700 cm/s LVOT VTI:          0.218 m LVOT/AV VTI ratio: 0.56  AORTA Ao Root diam: 3.00 cm MITRAL VALVE                 TRICUSPID VALVE MV Area (PHT): 2.99 cm     TR Peak grad:   34.1 mmHg MV Decel Time: 254 msec     TR Vmax:        292.00 cm/s MV E velocity: 84.80 cm/s MV A velocity: 104.00 cm/s  SHUNTS MV E/A ratio:  0.82         Systemic VTI:  0.22 m                             Systemic Diam: 1.90 cm Jenkins Rouge MD Electronically signed by Jenkins Rouge MD Signature Date/Time: 05/11/2019/9:59:25 AM    Final     PHYSICAL EXAM  Temp:  [97.5 F (36.4 C)-98 F (36.7 C)] 98 F (36.7 C) (02/26 1210) Pulse Rate:  [43-101] 80 (02/26 1411) Resp:  [15-20] 20 (02/26 1210) BP: (140-171)/(55-108) 158/89 (02/26 1210) SpO2:  [93 %-98 %] 98 % (02/26 1411) FiO2 (%):  [21 %] 21 % (02/25 2023) Weight:  [66.3 kg] 66.3 kg (02/25 2031)  General - Well nourished, well developed, in no apparent distress.  Ophthalmologic - fundi not visualized due to noncooperation.  Cardiovascular - Regular rhythm and rate, however, frequent premature beats.  Neuro -awake, alert, eyes open, orientated to self, and place but not to time or age.  Following all simple commands, able to repeat and name, no aphasia, however mild to moderate dysarthria.  PERRL, EOMI, visual field full.  Right mild facial droop.  Tongue midline.  Bilateral upper extremities 4+/5 proximal and distal.  Left lower extremity  4+/5, left lower extremity 3+/5 with drift.  Sensation subjectively symmetrical, finger-to-nose intact.  Gait not tested.   ASSESSMENT/PLAN Melinda Edwards is a 86 y.o. female with history of HTN, HLD presenting with R sided weakness and unsteady gait following a fall on Saturday at ALF  Stroke:   L BG/CR infarct secondary to small vessel disease source  CT head No acute abnormality. Small vessel disease. Atrophy.   MRI  Small L BG/CR infarct. Small vessel disease.   CTA head & neck evolving L basal ganglia and adjacent white matter infarct.  Diffuse intracranial atherosclerosis with severe right MCA, right PCA severe stenosis, left MCA  mild to moderate stenosis. ? Supraclinoid L ICA aneurysm vs irregularity  2D Echo EF 60-65%. No source of embolus   LDL 178  HgbA1c 5.6  Lovenox 40 mg sq daily for VTE prophylaxis  No antithrombotic prior to admission, now on aspirin 81 mg daily and clopidogrel 75 mg daily following load. Continue DAPT x 3 weeks then aspirin alone  Therapy recommendations:  Return to ALF w/ supervision and HH PT or short SNF stay  Disposition:  pending   Hypertension  Stable . Permissive hypertension (OK if < 220/120) but gradually normalize in 2-3 days . Long-term BP goal normotensive  Hyperlipidemia  Home meds:  lipitor 10  Now on lipitor 40  LDL 178, goal < 70  Continue statin at discharge  Other Stroke Risk Factors  Advanced age  Former Cigarette smoker, quit 23 yrs ago  Other Active Problems  Memory disorder with Dementia  Palpitations - EKG showing PACs  Hospital day # 1  Neurology will sign off. Please call with questions. Pt will follow up with stroke clinic NP at [facility] in about 4 weeks. Thanks for the consult.  Rosalin Hawking, MD PhD Stroke Neurology 05/11/2019 5:23 PM  To contact Stroke Continuity provider, please refer to [email or web address] After hours, contact General Neurology

## 2019-05-11 NOTE — NC FL2 (Signed)
[place] MEDICAID FL2 LEVEL OF CARE SCREENING TOOL     IDENTIFICATION  Patient Name: Melinda Edwards Birthdate: Sep 16, 1926 Sex: female Admission Date (Current Location): 05/10/2019  [facility] and [place] Number:  Herbalist and Address:  [facility], [place] [address] [place], [place] [address]      Provider Number: [id number]  Attending Physician Name and Address:  Charlynne Cousins, MD  Relative Name and Phone Number:  [name] [phone]    Current Level of Care: Hospital Recommended Level of Care: [place] Prior Approval Number:    Date Approved/Denied:   PASRR Number:    Discharge Plan: Other (Comment)(ALF)    Current Diagnoses: Patient Active Problem List   Diagnosis Date Noted  . Stroke ([place]) 05/10/2019  . Essential hypertension 05/10/2019  . Memory disorder 10/03/2013  . Nocturnal leg cramps 08/08/2012  . Aortic stenosis, mild 04/13/2011  . Benign hypertensive heart disease without heart failure 08/13/2010  . Hyperlipidemia 08/13/2010  . Palpitations 08/13/2010  . Bronchiectasis without acute exacerbation ([place]) 11/15/2007    Orientation RESPIRATION BLADDER Height & Weight     Place, Self  Normal Continent Weight: 146 lb 2.6 oz (66.3 kg) Height:  5\' 4"  (162.6 cm)  BEHAVIORAL SYMPTOMS/MOOD NEUROLOGICAL BOWEL NUTRITION STATUS      Continent Diet  AMBULATORY STATUS COMMUNICATION OF NEEDS Skin   Limited Assist Verbally Normal                       Personal Care Assistance Level of Assistance  Bathing, Feeding, Dressing Bathing Assistance: Limited assistance Feeding assistance: Independent Dressing Assistance: Limited assistance     Functional Limitations Info             SPECIAL CARE FACTORS FREQUENCY  PT (By licensed PT), OT (By licensed OT)     PT Frequency: 5x a week OT Frequency: 5x a week            Contractures Contractures Info: Not present    Additional Factors Info   Code Status, Allergies Code Status Info: DNR Allergies Info: Loratadine           Current Medications (05/11/2019):  This is the current hospital active medication list Current Facility-Administered Medications  Medication Dose Route Frequency Provider Last Rate Last Admin  . 0.9 %  sodium chloride infusion   Intravenous Continuous Charlynne Cousins, MD 10 mL/hr at 05/11/19 0933 Rate Change at 05/11/19 0933  . acetaminophen (TYLENOL) tablet 650 mg  650 mg Oral Q4H PRN Pokhrel, Laxman, MD       Or  . acetaminophen (TYLENOL) 160 MG/5ML solution 650 mg  650 mg Per Tube Q4H PRN Pokhrel, Laxman, MD       Or  . acetaminophen (TYLENOL) suppository 650 mg  650 mg Rectal Q4H PRN Pokhrel, Laxman, MD      . aspirin chewable tablet 324 mg  324 mg Oral Once Pokhrel, Laxman, MD      . aspirin EC tablet 81 mg  81 mg Oral Daily Pokhrel, Laxman, MD   81 mg at 05/11/19 1105  . atorvastatin (LIPITOR) tablet 40 mg  40 mg Oral q1800 Rosalin Hawking, MD      . clopidogrel (PLAVIX) tablet 300 mg  300 mg Oral Once Greta Doom, MD      . Derrill Memo ON 05/12/2019] clopidogrel (PLAVIX) tablet 300 mg  300 mg Oral Once Charlynne Cousins, MD      . [  START ON 05/13/2019] clopidogrel (PLAVIX) tablet 75 mg  75 mg Oral Daily Charlynne Cousins, MD      . enoxaparin (LOVENOX) injection 40 mg  40 mg Subcutaneous Q24H Pokhrel, Laxman, MD   40 mg at 05/10/19 2214  . senna-docusate (Senokot-S) tablet 1 tablet  1 tablet Oral QHS PRN Pokhrel, Laxman, MD         Discharge Medications: Please see discharge summary for a list of discharge medications.  Relevant Imaging Results:  Relevant Lab Results:   Additional Information SSN [id number]  [place], LCSW

## 2019-05-11 NOTE — Progress Notes (Signed)
  Echocardiogram 2D Echocardiogram has been performed.  Melinda Edwards A Greene Diodato 05/11/2019, 9:47 AM

## 2019-05-11 NOTE — Evaluation (Signed)
Physical Therapy Evaluation Patient Details Name: Melinda Edwards MRN: [id number] DOB: 1926-10-30 Today's Date: 05/11/2019   History of Present Illness  Pt is a 86 yo female presenting due to onset of gradual R-sided weakness and fall at ALF. Imaging revealed small acute L basal ganglia infarct. PMH includes: HTN and HLD.  Clinical Impression  Pt in bed upon arrival of PT, agreeable to evaluation at this time. Prior to admission the pt was independent without use of AD for ambulation at her ALF. The pt now presents with limitations in functional mobility and stability due to above dx, and will continue to benefit from skilled PT to address these deficits. The pt's course is further complicated by impaired cognition at baseline and STM deficits. PT will continue to follow to progress functional mobility and safety with mobility prior to d/c.      Follow Up Recommendations Supervision/Assistance - 24 hour;Other (comment)(if ALF/family can arrange or provide 24/7 supervision and PT, pt may return to ALF. If not, pt may need short stint SNF)    Equipment Recommendations  Rolling walker with 5" wheels(defer based on mobility progression)    Recommendations for Other Services       Precautions / Restrictions Precautions Precautions: Fall Restrictions Weight Bearing Restrictions: No      Mobility  Bed Mobility Overal bed mobility: Modified Independent             General bed mobility comments: pt used bed rails, no physical assist  Transfers Overall transfer level: Needs assistance Equipment used: Rolling walker (2 wheeled);None Transfers: Sit to/from Stand           General transfer comment: initially used RW, minG needed. then pt stood on her own from recliner without assist and no AD  Ambulation/Gait Ambulation/Gait assistance: Min guard Gait Distance (Feet): 5 Feet Assistive device: Rolling walker (2 wheeled);1 person hand held assist Gait Pattern/deviations: Step-to  pattern;Decreased stride length;Narrow base of support   Gait velocity interpretation: <1.31 ft/sec, indicative of household ambulator General Gait Details: pt with good stability using RW, minG for safety no LOB. VCs need to be simple and repeated for pt to complete. after PT left, pt stood independently from recliner, HHA of 1 for safety. pt with one posterior LOB when trying to turn  Stairs            Wheelchair Mobility    Modified Rankin (Stroke Patients Only) Modified Rankin (Stroke Patients Only) Pre-Morbid Rankin Score: No symptoms Modified Rankin: Moderately severe disability     Balance Overall balance assessment: Needs assistance Sitting-balance support: No upper extremity supported;Feet supported Sitting balance-Leahy Scale: Good Sitting balance - Comments: supervision needed for STM deficits and impulsivity     Standing balance-Leahy Scale: Poor Standing balance comment: requires at least HHAof 1 for stability                             Pertinent Vitals/Pain Pain Assessment: No/denies pain    Home Living Family/patient expects to be discharged to:: Assisted living   Available Help at Discharge: Friend(s);Available PRN/intermittently Type of Home: Assisted living           Additional Comments: unsure of validity of information from pt. PT reports she does not use any AD or have any equipment at home at baseline    Prior Function Level of Independence: Independent         Comments: pt reports previously independent with use of  hearing aids and glasses     Hand Dominance   Dominant Hand: Right    Extremity/Trunk Assessment   Upper Extremity Assessment Upper Extremity Assessment: Overall WFL for tasks assessed    Lower Extremity Assessment Lower Extremity Assessment: Overall WFL for tasks assessed    Cervical / Trunk Assessment Cervical / Trunk Assessment: Kyphotic  Communication   Communication: HOH(hearing aides in room)   Cognition Arousal/Alertness: Awake/alert Behavior During Therapy: Impulsive;Flat affect Overall Cognitive Status: History of cognitive impairments - at baseline                                 General Comments: pt cooperative and agreeable to PT session, minimally responsive throughout, seemed frustrated by questions about history and responded "we'll see" when asked if she wanted to use hearing aides. Pt demos sig STM deficits as pt was instructed in how to use call bellx3 and immediately after PT left the room the pt stood from the chair without calling for assistance and tried to go back to bed.      General Comments      Exercises     Assessment/Plan    PT Assessment Patient needs continued PT services  PT Problem List Decreased strength;Decreased safety awareness;Decreased mobility;Decreased range of motion;Decreased coordination;Decreased knowledge of precautions;Decreased activity tolerance;Decreased cognition;Decreased balance       PT Treatment Interventions DME instruction;Therapeutic exercise;Gait training;Balance training;Neuromuscular re-education;Cognitive remediation;Therapeutic activities;Functional mobility training;Patient/family education    PT Goals (Current goals can be found in the Care Plan section)  Acute Rehab PT Goals Patient Stated Goal: to go to bed PT Goal Formulation: With patient Time For Goal Achievement: 05/25/19 Potential to Achieve Goals: Good    Frequency Min 3X/week   Barriers to discharge        Co-evaluation               AM-PAC PT "6 Clicks" Mobility  Outcome Measure Help needed turning from your back to your side while in a flat bed without using bedrails?: A Little Help needed moving from lying on your back to sitting on the side of a flat bed without using bedrails?: A Little Help needed moving to and from a bed to a chair (including a wheelchair)?: A Little Help needed standing up from a chair using your arms  (e.g., wheelchair or bedside chair)?: A Little Help needed to walk in hospital room?: A Little Help needed climbing 3-5 steps with a railing? : A Lot 6 Click Score: 17    End of Session Equipment Utilized During Treatment: Gait belt Activity Tolerance: Patient tolerated treatment well Patient left: in bed;with call bell/phone within reach;with bed alarm set Nurse Communication: Mobility status PT Visit Diagnosis: Difficulty in walking, not elsewhere classified (R26.2);History of falling (Z91.81)    Time: [id number] PT Time Calculation (min) (ACUTE ONLY): 18 min   Charges:   PT Evaluation $PT Eval Moderate Complexity: 1 Mod          Karma Ganja, PT, DPT   Acute Rehabilitation Department Pager #: [phone]  Otho Bellows 05/11/2019, 2:23 PM

## 2019-05-12 NOTE — Progress Notes (Signed)
[facility] PROGRESS NOTE    Progress Note  Melinda Edwards  [id number] DOB: [birth year] DOA: 05/10/2019 PCP: Lajean Manes, MD     Brief Narrative:   Melinda Edwards is an 86 y.o. female with past medical history of hypertension hyperlipidemia palpitation not on medication who comes into the hospital complaining of right-sided weakness.  On the day of admission she was weak and with a slurred speech last time she was seen normal was the day prior she was brought into the ED and found to have a CVA  Assessment/Plan:   Stroke [facility]) Patient is not a candidate for TPA. HgbA1c 5.6, fasting lipid panel and LDL greater than 150. MRI, MRA of the brain without contrast infarct on the left basal ganglia PT pending, OT patient recommended skilled nursing facility, Speech consulted recommended a regular diet CT angio of the neck showed no significant stenosis Neurology was consulted Transthoracic Echo showed an ejection fraction of 60% with no wall motion abnormalities there was mild asymmetric left ventricular hypertrophy with grade 1 diastolic heart failure right ventricular systolic parameters were normal. Cont patient on ASA 81mg  daily and plavix 75mg  daily, for 3 weeks then aspirin alone. Atorvastatin  high-dose BP goal: permissive HTN upto 220/120 mmHg Telemetry monitoring with no events  Hyperlipidemia: Statins therpay.  History of essential hypertension on no medications: We will allow permissive hypertension, her blood pressure has been slowly trending down is 1 is 132/72.  Memory disorder/history of dementia: Noted  Palpitations: No events on telemetry.  DVT prophylaxis: lovenox Family Communication:Friend and HCPOA Disposition Plan/Barrier to D/C: Legal therapy and occupational therapy evaluated the patient recommended skilled nursing facility awaiting social worker  Code Status:     Code Status Orders  (From admission, onward)         Start     Ordered   05/10/19 2032  Do not attempt resuscitation (DNR)  Continuous    Question Answer Comment  In the event of cardiac or respiratory ARREST Do not call a code blue   In the event of cardiac or respiratory ARREST Do not perform Intubation, CPR, defibrillation or ACLS   In the event of cardiac or respiratory ARREST Use medication by any route, position, wound care, and other measures to relive pain and suffering. May use oxygen, suction and manual treatment of airway obstruction as needed for comfort.      05/10/19 2031        Code Status History    Date Active Date Inactive Code Status Order ID Comments User Context   05/10/2019 2022 05/10/2019 2031 DNR [id number]  Flora Lipps, MD Inpatient   Advance Care Planning Activity        IV Access:    Peripheral IV   Procedures and diagnostic studies:   CT Angio Head W or Wo Contrast  Result Date: 05/10/2019 CLINICAL DATA:  Acute infarct, follow-up EXAM: CT ANGIOGRAPHY HEAD AND NECK TECHNIQUE: Multidetector CT imaging of the head and neck was performed using the standard protocol during bolus administration of intravenous contrast. Multiplanar CT image reconstructions and MIPs were obtained to evaluate the vascular anatomy. Carotid stenosis measurements (when applicable) are obtained utilizing NASCET criteria, using the distal internal carotid diameter as the denominator. CONTRAST:  20mL OMNIPAQUE IOHEXOL 350 MG/ML SOLN COMPARISON:  Earlier same day CT head FINDINGS: CT HEAD FINDINGS Brain: Small infarction involving the left basal ganglia and adjacent white matter is again identified. There is no new loss of gray-white differentiation. There is no  acute intracranial hemorrhage or mass effect. Stable findings of chronic microvascular ischemic changes and parenchymal volume loss. Vascular: No new findings. Skull: Unremarkable. Sinuses: No acute abnormality. Orbits: No acute abnormality Review of the MIP images confirms the above findings CTA NECK  FINDINGS Aortic arch: Great vessel origins are patent. Right carotid system: Patent. Calcified plaque at the bifurcation. There is no measurable stenosis at the ICA origin. Left carotid system: Patent. No significant plaque or measurable stenosis at the ICA origin. Vertebral arteries: Patent and codominant. Skeleton: Multilevel cervical spine degenerative changes. Other neck: 1 cm left thyroid nodule.  No neck mass or adenopathy. Upper chest: Scarring at the lung apices. Review of the MIP images confirms the above findings CTA HEAD FINDINGS Anterior circulation: Intracranial internal carotid arteries are patent with mild calcified plaque. Anterior and middle cerebral arteries are patent. There is multifocal atherosclerotic irregularity and stenoses. For example, moderate to severe short segment stenosis of the proximal right M1 MCA. Focal moderate stenosis of the left M1 MCA. Moderate to marked stenosis of the right A2 ACA. Additional stenoses are present more distally. Question of a 2 mm broad-based shallow inferiorly directed outpouching from the distal supraclinoid left ICA versus atherosclerotic irregularity. Posterior circulation: Intracranial vertebral arteries, basilar artery, and posterior cerebral arteries are patent. There is atherosclerotic irregularity and multifocal stenoses including diffuse mild to moderate stenosis of the right P2 PCA and least moderate stenosis at the left P2 PCA branch point. Venous sinuses: As permitted by contrast timing, patent. Review of the MIP images confirms the above findings IMPRESSION: Evolving small infarction of the left basal ganglia and adjacent white matter. No acute intracranial hemorrhage. Stable chronic findings detailed above. No hemodynamically significant stenosis in the neck. Multifocal intracranial atherosclerosis involving anterior and posterior circulations. Question of a 2 mm broad-based outpouching from the distal supraclinoid left ICA versus  atherosclerotic irregularity. Electronically Signed   By: Macy Mis M.D.   On: 05/10/2019 18:35   CT HEAD WO CONTRAST  Result Date: 05/10/2019 CLINICAL DATA:  Right-sided weakness. EXAM: CT HEAD WITHOUT CONTRAST TECHNIQUE: Contiguous axial images were obtained from the base of the skull through the vertex without intravenous contrast. COMPARISON:  None. FINDINGS: Brain: Mild diffuse cortical atrophy is noted. Mild chronic ischemic white matter disease is noted. No mass effect or midline shift is noted. Ventricular size is within normal limits. There is no evidence of mass lesion, hemorrhage or acute infarction. Vascular: No hyperdense vessel or unexpected calcification. Skull: Normal. Negative for fracture or focal lesion. Sinuses/Orbits: No acute finding. Other: None. IMPRESSION: Mild diffuse cortical atrophy. Mild chronic ischemic white matter disease. No acute intracranial abnormality seen. Electronically Signed   By: Marijo Conception M.D.   On: 05/10/2019 12:56   CT Angio Neck W and/or Wo Contrast  Result Date: 05/10/2019 CLINICAL DATA:  Acute infarct, follow-up EXAM: CT ANGIOGRAPHY HEAD AND NECK TECHNIQUE: Multidetector CT imaging of the head and neck was performed using the standard protocol during bolus administration of intravenous contrast. Multiplanar CT image reconstructions and MIPs were obtained to evaluate the vascular anatomy. Carotid stenosis measurements (when applicable) are obtained utilizing NASCET criteria, using the distal internal carotid diameter as the denominator. CONTRAST:  12mL OMNIPAQUE IOHEXOL 350 MG/ML SOLN COMPARISON:  Earlier same day CT head FINDINGS: CT HEAD FINDINGS Brain: Small infarction involving the left basal ganglia and adjacent white matter is again identified. There is no new loss of gray-white differentiation. There is no acute intracranial hemorrhage or mass effect. Stable findings  of chronic microvascular ischemic changes and parenchymal volume loss.  Vascular: No new findings. Skull: Unremarkable. Sinuses: No acute abnormality. Orbits: No acute abnormality Review of the MIP images confirms the above findings CTA NECK FINDINGS Aortic arch: Great vessel origins are patent. Right carotid system: Patent. Calcified plaque at the bifurcation. There is no measurable stenosis at the ICA origin. Left carotid system: Patent. No significant plaque or measurable stenosis at the ICA origin. Vertebral arteries: Patent and codominant. Skeleton: Multilevel cervical spine degenerative changes. Other neck: 1 cm left thyroid nodule.  No neck mass or adenopathy. Upper chest: Scarring at the lung apices. Review of the MIP images confirms the above findings CTA HEAD FINDINGS Anterior circulation: Intracranial internal carotid arteries are patent with mild calcified plaque. Anterior and middle cerebral arteries are patent. There is multifocal atherosclerotic irregularity and stenoses. For example, moderate to severe short segment stenosis of the proximal right M1 MCA. Focal moderate stenosis of the left M1 MCA. Moderate to marked stenosis of the right A2 ACA. Additional stenoses are present more distally. Question of a 2 mm broad-based shallow inferiorly directed outpouching from the distal supraclinoid left ICA versus atherosclerotic irregularity. Posterior circulation: Intracranial vertebral arteries, basilar artery, and posterior cerebral arteries are patent. There is atherosclerotic irregularity and multifocal stenoses including diffuse mild to moderate stenosis of the right P2 PCA and least moderate stenosis at the left P2 PCA branch point. Venous sinuses: As permitted by contrast timing, patent. Review of the MIP images confirms the above findings IMPRESSION: Evolving small infarction of the left basal ganglia and adjacent white matter. No acute intracranial hemorrhage. Stable chronic findings detailed above. No hemodynamically significant stenosis in the neck. Multifocal  intracranial atherosclerosis involving anterior and posterior circulations. Question of a 2 mm broad-based outpouching from the distal supraclinoid left ICA versus atherosclerotic irregularity. Electronically Signed   By: Macy Mis M.D.   On: 05/10/2019 18:35   MR BRAIN WO CONTRAST  Result Date: 05/10/2019 CLINICAL DATA:  Possible stroke with right-sided weakness EXAM: MRI HEAD WITHOUT CONTRAST TECHNIQUE: Multiplanar, multiecho pulse sequences of the brain and surrounding structures were obtained without intravenous contrast. COMPARISON:  None. FINDINGS: Brain: There is a small acute infarction involving the left basal ganglia and adjacent white matter. No evidence of intracranial hemorrhage. There is no intracranial mass, mass effect, or edema. There is no hydrocephalus or extra-axial fluid collection. Prominence of the ventricles and sulci reflects generalized parenchymal volume loss. Patchy and confluent areas of T2 hyperintensity in the supratentorial white matter are nonspecific but may reflect mild to moderate chronic microvascular ischemic changes. Vascular: Major vessel flow voids at the skull base are preserved. Skull and upper cervical spine: Normal marrow signal is preserved. Sinuses/Orbits: Trace mucosal thickening. Bilateral lens replacements. Other: Sella is unremarkable.  Mastoid air cells are clear. IMPRESSION: Small acute infarction involving the left basal ganglia and adjacent white matter. Chronic microvascular ischemic changes. Electronically Signed   By: Macy Mis M.D.   On: 05/10/2019 16:09   DG Chest Portable 1 View  Result Date: 05/10/2019 CLINICAL DATA:  New onset of right-sided weakness and slurred speech. EXAM: PORTABLE CHEST 1 VIEW COMPARISON:  Chest x-ray dated 11/30/2017 FINDINGS: The heart size and pulmonary vascularity are normal and the lungs are clear. Chronic bilateral apical pleural thickening. No significant bone abnormality. IMPRESSION: No acute abnormality.  Electronically Signed   By: Lorriane Shire M.D.   On: 05/10/2019 13:22   ECHOCARDIOGRAM COMPLETE  Result Date: 05/11/2019    ECHOCARDIOGRAM REPORT  Patient Name:   Melinda Edwards Date of Exam: 05/11/2019 Medical Rec #:  [id number]       Height:       64.0 in Accession #:    [id number]      Weight:       146.2 lb Date of Birth:  1926-12-31        BSA:          1.712 m Patient Age:    65 years        BP:           140/55 mmHg Patient Gender: F               HR:           45 bpm. Exam Location:  Inpatient Procedure: 2D Echo Indications:    Stroke 434.91 / I163.9  History:        Patient has no prior history of Echocardiogram examinations.                 COPD, Arrythmias:Palpitations; Risk Factors:Hypertension and                 Dyslipidemia.  Sonographer:    Vikki Ports Turrentine Referring Phys: [id number] [place]  1. Left ventricular ejection fraction, by estimation, is 60 to 65%. The left ventricle has normal function. The left ventricle has no regional wall motion abnormalities. There is mild asymmetric left ventricular hypertrophy of the basal-septal segment. Left ventricular diastolic parameters are consistent with Grade I diastolic dysfunction (impaired relaxation). Elevated left ventricular end-diastolic pressure.  2. Right ventricular systolic function is normal. The right ventricular size is normal. There is mildly elevated pulmonary artery systolic pressure.  3. Left atrial size was mildly dilated.  4. The mitral valve is normal in structure and function. No evidence of mitral valve regurgitation. No evidence of mitral stenosis.  5. The aortic valve is tricuspid. Aortic valve regurgitation is mild. Mild to moderate aortic valve sclerosis/calcification is present, without any evidence of aortic stenosis.  6. The inferior vena cava is normal in size with greater than 50% respiratory variability, suggesting right atrial pressure of 3 mmHg. FINDINGS  Left Ventricle: Left ventricular ejection  fraction, by estimation, is 60 to 65%. The left ventricle has normal function. The left ventricle has no regional wall motion abnormalities. The left ventricular internal cavity size was normal in size. There is  mild asymmetric left ventricular hypertrophy of the basal-septal segment. Left ventricular diastolic parameters are consistent with Grade I diastolic dysfunction (impaired relaxation). Elevated left ventricular end-diastolic pressure. Right Ventricle: The right ventricular size is normal. No increase in right ventricular wall thickness. Right ventricular systolic function is normal. There is mildly elevated pulmonary artery systolic pressure. The tricuspid regurgitant velocity is 2.92  m/s, and with an assumed right atrial pressure of 3 mmHg, the estimated right ventricular systolic pressure is [id number] mmHg. Left Atrium: Left atrial size was mildly dilated. Right Atrium: Right atrial size was normal in size. Pericardium: There is no evidence of pericardial effusion. Mitral Valve: The mitral valve is normal in structure and function. Normal mobility of the mitral valve leaflets. Moderate mitral annular calcification. No evidence of mitral valve regurgitation. No evidence of mitral valve stenosis. Tricuspid Valve: The tricuspid valve is normal in structure. Tricuspid valve regurgitation is mild . No evidence of tricuspid stenosis. Aortic Valve: The aortic valve is tricuspid. Aortic valve regurgitation is mild. Mild to moderate aortic valve sclerosis/calcification is present, without any evidence  of aortic stenosis. Aortic valve mean gradient measures 7.0 mmHg. Aortic valve peak gradient measures 12.5 mmHg. Aortic valve area, by VTI measures 1.58 cm. Pulmonic Valve: The pulmonic valve was normal in structure. Pulmonic valve regurgitation is not visualized. No evidence of pulmonic stenosis. Aorta: The aortic root is normal in size and structure. Venous: The inferior vena cava is normal in size with greater than  50% respiratory variability, suggesting right atrial pressure of 3 mmHg. IAS/Shunts: No atrial level shunt detected by color flow Doppler.  LEFT VENTRICLE PLAX 2D LVIDd:         3.20 cm  Diastology LVIDs:         2.40 cm  LV e' lateral:   5.44 cm/s LV PW:         1.10 cm  LV E/e' lateral: 15.6 LV IVS:        1.10 cm  LV e' medial:    5.11 cm/s LVOT diam:     1.90 cm  LV E/e' medial:  16.6 LV SV:         62 LV SV Index:   36 LVOT Area:     2.84 cm  RIGHT VENTRICLE RV S prime:     18.80 cm/s TAPSE (M-mode): 1.8 cm LEFT ATRIUM             Index       RIGHT ATRIUM           Index LA diam:        3.80 cm 2.22 cm/m  RA Area:     11.40 cm LA Vol (A2C):   38.9 ml 22.72 ml/m RA Volume:   23.20 ml  13.55 ml/m LA Vol (A4C):   39.0 ml 22.78 ml/m LA Biplane Vol: 39.7 ml 23.19 ml/m  AORTIC VALVE AV Area (Vmax):    1.83 cm AV Area (Vmean):   1.67 cm AV Area (VTI):     1.58 cm AV Vmax:           177.00 cm/s AV Vmean:          118.000 cm/s AV VTI:            0.390 m AV Peak Grad:      12.5 mmHg AV Mean Grad:      7.0 mmHg LVOT Vmax:         114.00 cm/s LVOT Vmean:        69.700 cm/s LVOT VTI:          0.218 m LVOT/AV VTI ratio: 0.56  AORTA Ao Root diam: 3.00 cm MITRAL VALVE                TRICUSPID VALVE MV Area (PHT): 2.99 cm     TR Peak grad:   34.1 mmHg MV Decel Time: 254 msec     TR Vmax:        292.00 cm/s MV E velocity: 84.80 cm/s MV A velocity: 104.00 cm/s  SHUNTS MV E/A ratio:  0.82         Systemic VTI:  0.22 m                             Systemic Diam: 1.90 cm Jenkins Rouge MD Electronically signed by Jenkins Rouge MD Signature Date/Time: 05/11/2019/9:59:25 AM    Final      Medical Consultants:    None.  Anti-Infectives:   None  Subjective:    Melinda Long  Edwards she has no new complaints today.  Objective:    Vitals:   05/11/19 2007 05/11/19 2353 05/12/19 0406 05/12/19 0800  BP: (!) 149/75 (!) 147/95 (!) 152/77 132/72  Pulse: 79 71 75 84  Resp: 16 17 16 20   Temp: 98.1 F (36.7 C) 98 F (36.7  C) 97.8 F (36.6 C) 98.8 F (37.1 C)  TempSrc: Oral Oral Oral Oral  SpO2: 95% 93% 95% 93%  Weight:      Height:       SpO2: 93 % O2 Flow Rate (L/min): 0 L/min FiO2 (%): 21 %   Intake/Output Summary (Last 24 hours) at 05/12/2019 1044 Last data filed at 05/12/2019 0600 Gross per 24 hour  Intake 240 ml  Output --  Net 240 ml   Filed Weights   05/10/19 2031  Weight: 66.3 kg    Exam: General exam: In no acute distress. Respiratory system: Good air movement and clear to auscultation. Cardiovascular system: S1 & S2 heard, RRR. No JVD. Gastrointestinal system: Abdomen is nondistended, soft and nontender.  Central nervous system: Alert and oriented. No focal neurological deficits. Extremities: No pedal edema. Skin: No rashes, lesions or ulcers  Data Reviewed:    Labs: Basic Metabolic Panel: Recent Labs  Lab 05/10/19 1237 05/10/19 2048  NA 142  --   K 3.7  --   CL 108  --   CO2 23  --   GLUCOSE 132*  --   BUN 19  --   CREATININE 0.78 0.66  CALCIUM 9.1  --    GFR Estimated Creatinine Clearance: 43.8 mL/min (by C-G formula based on SCr of 0.66 mg/dL). Liver Function Tests: Recent Labs  Lab 05/10/19 1237  AST 16  ALT 14  ALKPHOS 77  BILITOT 1.1  PROT 7.8  ALBUMIN 4.1   No results for input(s): LIPASE, AMYLASE in the last 168 hours. No results for input(s): AMMONIA in the last 168 hours. Coagulation profile Recent Labs  Lab 05/10/19 1237  INR 1.0   COVID-19 Labs  No results for input(s): DDIMER, FERRITIN, LDH, CRP in the last 72 hours.  Lab Results  Component Value Date   [place] NEGATIVE 05/10/2019    CBC: Recent Labs  Lab 05/10/19 1237  WBC 8.9  NEUTROABS 6.1  HGB 13.8  HCT 42.9  MCV 86.8  PLT 221   Cardiac Enzymes: No results for input(s): CKTOTAL, CKMB, CKMBINDEX, TROPONINI in the last 168 hours. BNP (last 3 results) No results for input(s): PROBNP in the last 8760 hours. CBG: No results for input(s): GLUCAP in the last 168  hours. D-Dimer: No results for input(s): DDIMER in the last 72 hours. Hgb A1c: Recent Labs    05/11/19 0308  HGBA1C 5.6   Lipid Profile: Recent Labs    05/11/19 0308  CHOL 247*  HDL 50  LDLCALC 178*  TRIG 94  CHOLHDL 4.9   Thyroid function studies: Recent Labs    05/10/19 2048  TSH 3.837   Anemia work up: No results for input(s): VITAMINB12, FOLATE, FERRITIN, TIBC, IRON, RETICCTPCT in the last 72 hours. Sepsis Labs: Recent Labs  Lab 05/10/19 1237  WBC 8.9   Microbiology Recent Results (from the past 240 hour(s))  SARS CORONAVIRUS 2 (TAT 6-24 HRS) Nasopharyngeal Nasopharyngeal Swab     Status: None   Collection Time: 05/10/19  6:09 PM   Specimen: Nasopharyngeal Swab  Result Value Ref Range Status   SARS Coronavirus 2 NEGATIVE NEGATIVE Final    Comment: (NOTE) SARS-CoV-2 target  nucleic acids are NOT DETECTED. The SARS-CoV-2 RNA is generally detectable in upper and lower respiratory specimens during the acute phase of infection. Negative results do not preclude SARS-CoV-2 infection, do not rule out co-infections with other pathogens, and should not be used as the sole basis for treatment or other patient management decisions. Negative results must be combined with clinical observations, patient history, and epidemiological information. The expected result is Negative. Fact Sheet for Patients: SugarRoll.be Fact Sheet for Healthcare Providers: https://www.woods-mathews.com/ This test is not yet approved or cleared by the [place] FDA and  has been authorized for detection and/or diagnosis of SARS-CoV-2 by FDA under an Emergency Use Authorization (EUA). This EUA will remain  in effect (meaning this test can be used) for the duration of the COVID-19 declaration under Section 56 4(b)(1) of the Act, 21 U.S.C. section 360bbb-3(b)(1), unless the authorization is terminated or revoked sooner. Performed at [facility], [place] [address], [place], [place] [address]      Medications:    aspirin  324 mg Oral Once   aspirin EC  81 mg Oral Daily   atorvastatin  40 mg Oral q1800   clopidogrel  300 mg Oral Once   [START ON 05/13/2019] clopidogrel  75 mg Oral Daily   enoxaparin (LOVENOX) injection  40 mg Subcutaneous Q24H   Continuous Infusions:  sodium chloride 10 mL/hr at 05/11/19 0933      LOS: 2 days   Charlynne Cousins  [facility]  05/12/2019, 10:43 AM

## 2019-05-12 NOTE — Plan of Care (Signed)
  Problem: Health Behavior/Discharge Planning: Goal: Ability to manage health-related needs will improve Outcome: Progressing   Problem: Education: Goal: Knowledge of General Education information will improve Description: Including pain rating scale, medication(s)/side effects and non-pharmacologic comfort measures Outcome: Progressing   

## 2019-05-13 MED ORDER — SODIUM CHLORIDE 0.9% FLUSH
3.0000 mL | Freq: Two times a day (BID) | INTRAVENOUS | Status: DC
  Administered 2019-05-13 – 2019-05-14 (×3): 3 mL via INTRAVENOUS

## 2019-05-13 MED ORDER — DILTIAZEM HCL 30 MG PO TABS
30.0000 mg | ORAL_TABLET | Freq: Four times a day (QID) | ORAL | Status: DC
  Administered 2019-05-13 – 2019-05-14 (×5): 30 mg via ORAL
  Filled 2019-05-13 (×5): qty 1

## 2019-05-13 MED ORDER — METOPROLOL TARTRATE 5 MG/5ML IV SOLN
5.0000 mg | Freq: Once | INTRAVENOUS | Status: AC
  Administered 2019-05-13: 06:00:00 5 mg via INTRAVENOUS
  Filled 2019-05-13: qty 5

## 2019-05-13 MED ORDER — SODIUM CHLORIDE 0.9% FLUSH: 3.0000 mL | INTRAVENOUS | Status: DC | PRN

## 2019-05-13 MED ORDER — APIXABAN 5 MG PO TABS
5.0000 mg | ORAL_TABLET | Freq: Two times a day (BID) | ORAL | Status: DC
  Administered 2019-05-13 – 2019-05-14 (×3): 5 mg via ORAL
  Filled 2019-05-13 (×3): qty 1

## 2019-05-13 MED ORDER — METOPROLOL TARTRATE 5 MG/5ML IV SOLN
10.0000 mg | Freq: Four times a day (QID) | INTRAVENOUS | Status: DC | PRN
  Administered 2019-05-13: 10 mg via INTRAVENOUS
  Filled 2019-05-13: qty 10

## 2019-05-13 NOTE — Progress Notes (Signed)
Physical Therapy Treatment Patient Details Name: Melinda Edwards MRN: [id number] DOB: 12/11/1926 Today's Date: 05/13/2019    History of Present Illness Pt is a 86 yo female presenting due to onset of gradual R-sided weakness and fall at ALF. Imaging revealed small acute L basal ganglia infarct. PMH includes: HTN and HLD.    PT Comments    Pt progressing steadily towards her physical therapy goals. Session focused on therapeutic exercises for strengthening and progression of mobility. Pt ambulating 350 feet with a walker at a min guard assist level. Tendency for left drift but able to correct without running into obstacles. Think pt will benefit from d/c to familiar home environment. Per CSW note on 2/26, pt ALF able to provide 24/7 supervision.    Follow Up Recommendations  Home health PT;Supervision/Assistance - 24 hour (return to ALF)     Equipment Recommendations  Rolling walker with 5" wheels    Recommendations for Other Services       Precautions / Restrictions Precautions Precautions: Fall Restrictions Weight Bearing Restrictions: No    Mobility  Bed Mobility Overal bed mobility: Modified Independent                Transfers Overall transfer level: Needs assistance Equipment used: Rolling walker (2 wheeled) Transfers: Sit to/from Stand Sit to Stand: Supervision            Ambulation/Gait Ambulation/Gait assistance: Min guard Gait Distance (Feet): 350 Feet Assistive device: Rolling walker (2 wheeled);None Gait Pattern/deviations: Decreased stride length;Step-through pattern Gait velocity: decreased   General Gait Details: Min guard for stability, pt tending to drift consistently left using walker but able to self correct without running into any obstacles. Demonstrates more stability/independence with walker vs without   Stairs             Wheelchair Mobility    Modified Rankin (Stroke Patients Only) Modified Rankin (Stroke Patients  Only) Pre-Morbid Rankin Score: No symptoms Modified Rankin: Moderately severe disability     Balance Overall balance assessment: Needs assistance Sitting-balance support: No upper extremity supported;Feet supported Sitting balance-Leahy Scale: Good     Standing balance support: No upper extremity supported;During functional activity Standing balance-Leahy Scale: Fair                              Cognition Arousal/Alertness: Awake/alert Behavior During Therapy: WFL for tasks assessed/performed Overall Cognitive Status: History of cognitive impairments - at baseline                                 General Comments: Pt pleasant, following all commands.       Exercises General Exercises - Upper Extremity Shoulder Flexion: Both;10 reps;Seated General Exercises - Lower Extremity Long Arc Quad: Both;10 reps;Seated Hip Flexion/Marching: Both;10 reps;Seated    General Comments        Pertinent Vitals/Pain Pain Assessment: Faces Faces Pain Scale: No hurt    Home Living                      Prior Function            PT Goals (current goals can now be found in the care plan section) Acute Rehab PT Goals Patient Stated Goal: Pt POA would like her to have 24/7 assist Potential to Achieve Goals: Good Progress towards PT goals: Progressing toward goals    Frequency  Min 3X/week      PT Plan Current plan remains appropriate    Co-evaluation              AM-PAC PT "6 Clicks" Mobility   Outcome Measure  Help needed turning from your back to your side while in a flat bed without using bedrails?: None Help needed moving from lying on your back to sitting on the side of a flat bed without using bedrails?: None Help needed moving to and from a bed to a chair (including a wheelchair)?: A Little Help needed standing up from a chair using your arms (e.g., wheelchair or bedside chair)?: None Help needed to walk in hospital room?: A  Little Help needed climbing 3-5 steps with a railing? : A Little 6 Click Score: 21    End of Session Equipment Utilized During Treatment: Gait belt Activity Tolerance: Patient tolerated treatment well Patient left: in chair;with call bell/phone within reach;with chair alarm set;with family/visitor present Nurse Communication: Mobility status PT Visit Diagnosis: Difficulty in walking, not elsewhere classified (R26.2);History of falling (Z91.81)     Time: [id number] PT Time Calculation (min) (ACUTE ONLY): 32 min  Charges:  $Therapeutic Exercise: 8-22 mins $Therapeutic Activity: 8-22 mins                       Wyona Almas, PT, DPT Acute Rehabilitation Services Pager [phone] Office [phone]    Deno Etienne 05/13/2019, 3:36 PM

## 2019-05-13 NOTE — Plan of Care (Signed)
  Problem: Clinical Measurements: Goal: Diagnostic test results will improve Outcome: Progressing   Problem: Clinical Measurements: Goal: Ability to maintain clinical measurements within normal limits will improve Outcome: Progressing   Problem: Clinical Measurements: Goal: Will remain free from infection Outcome: Progressing   Problem: Clinical Measurements: Goal: Ability to maintain clinical measurements within normal limits will improve Outcome: Progressing   Problem: Clinical Measurements: Goal: Diagnostic test results will improve Outcome: Progressing

## 2019-05-13 NOTE — Progress Notes (Signed)
ANTICOAGULATION CONSULT NOTE - Initial Consult  Pharmacy Consult for Apixaban Indication: New Afib  Allergies  Allergen Reactions  . Loratadine Other (See Comments)    Nose bleed    Patient Measurements: Height: 5\' 4"  (162.6 cm) Weight: 146 lb 2.6 oz (66.3 kg) IBW/kg (Calculated) : 54.7  Vital Signs: Temp: 98.1 F (36.7 C) (02/28 0758) Temp Source: Oral (02/28 0758) BP: 105/78 (02/28 0758) Pulse Rate: 76 (02/28 0758)  Labs: Recent Labs    05/10/19 1237 05/10/19 2048  HGB 13.8  --   HCT 42.9  --   PLT 221  --   APTT 29  --   LABPROT 13.5  --   INR 1.0  --   CREATININE 0.78 0.66    Estimated Creatinine Clearance: 43.8 mL/min (by C-G formula based on SCr of 0.66 mg/dL).   Medical History: Past Medical History:  Diagnosis Date  . Hyperlipidemia   . Hypertension   . PAC (premature atrial contraction)    BENIGH  . Palpitations   . Pulmonary nodule    Assessment: 70 YOF who presented on 2/25 with R-sided weakness and found to have a new CVA. Earlier today, the patient was found to show Afib on the monitor and pharmacy has been consulted to start Apixaban for anticoagulation.   The patient was on Lovenox 40 for VTE prophylaxis with the last dose on 2/27 PM at 2130.   Despite the patient's age>82 given both her weight>60kg, and SCr<1.5 - she qualifies for the 5 mg bid dosing. Will plan to initiate dosing this AM.  Goal of Therapy:  Appropriate anticoagulation for indication and hepatic/renal function    Plan:  - Start Apixaban 5 mg bid - Will place AVS in chart, will need education prior to discharge - Will sign off of consult and monitor peripherally for any bleeding or necessary dose adjustments  Thank you for allowing pharmacy to be a part of this patient's care.  Alycia Rossetti, PharmD, BCPS Clinical Pharmacist 05/13/2019 10:01 AM   **Pharmacist phone directory can now be found on [email or web address] ([email or web address]).  Listed under [place].

## 2019-05-13 NOTE — Progress Notes (Addendum)
[facility] PROGRESS NOTE    Progress Note  Melinda Edwards  [id number] DOB: [birth year] DOA: 05/10/2019 PCP: Lajean Manes, MD     Brief Narrative:   Melinda Edwards is an 86 y.o. female with past medical history of hypertension hyperlipidemia palpitation not on medication who comes into the hospital complaining of right-sided weakness.  On the day of admission she was weak and with a slurred speech last time she was seen normal was the day prior she was brought into the ED and found to have a CVA  Assessment/Plan:   Stroke ([place]) Hemoglobin A1c is 5.6, fasting lipid panel showed an LDL greater than 150 MRI showed a CVA. Physical therapy and Occupational Therapy recommended skilled nursing facility. Speech recommended regular diet. CT angio of the neck showed no significant stenosis neurology was consulted they recommended a 2D echo that showed a preserved EF no clots, recommended high-dose Lipitor and permissive hypertension.  On 03/11/2020 her telemetry monitoring shows atrial fibrillation, will start her on oral diltiazem and Eliquis will DC Plavix will discuss with neurology.  Hyperlipidemia: Statins therpay.  History of essential hypertension on no medications: We will allow permissive hypertension, her blood pressure has been slowly trending down is 1 is 132/72.  Memory disorder/history of dementia: Noted  Palpitations: No events on telemetry.  DVT prophylaxis: lovenox Family Communication:Friend and HCPOA Disposition Plan/Barrier to D/C: phy therapy and occupational therapy evaluated the patient recommended skilled nursing facility awaiting social worker  Code Status:     Code Status Orders  (From admission, onward)         Start     Ordered   05/10/19 2032  Do not attempt resuscitation (DNR)  Continuous    Question Answer Comment  In the event of cardiac or respiratory ARREST Do not call a "code blue"   In the event of cardiac or respiratory ARREST Do  not perform Intubation, CPR, defibrillation or ACLS   In the event of cardiac or respiratory ARREST Use medication by any route, position, wound care, and other measures to relive pain and suffering. May use oxygen, suction and manual treatment of airway obstruction as needed for comfort.      05/10/19 2031        Code Status History    Date Active Date Inactive Code Status Order ID Comments User Context   05/10/2019 2022 05/10/2019 2031 DNR [id number]  Flora Lipps, MD Inpatient   Advance Care Planning Activity        IV Access:    Peripheral IV   Procedures and diagnostic studies:   ECHOCARDIOGRAM COMPLETE  Result Date: 05/11/2019    ECHOCARDIOGRAM REPORT   Patient Name:   [name] Date of Exam: 05/11/2019 Medical Rec #:  [id number]       Height:       64.0 in Accession #:    [id number]      Weight:       146.2 lb Date of Birth:  Feb 25, 1927        BSA:          1.712 m Patient Age:    6 years        BP:           140/55 mmHg Patient Gender: F               HR:           45 bpm. Exam Location:  Inpatient Procedure: 2D Echo Indications:  Stroke 434.91 / I163.9  History:        Patient has no prior history of Echocardiogram examinations.                 COPD, Arrythmias:Palpitations; Risk Factors:Hypertension and                 Dyslipidemia.  Sonographer:    Vikki Ports Turrentine Referring Phys: [id number] [place]  1. Left ventricular ejection fraction, by estimation, is 60 to 65%. The left ventricle has normal function. The left ventricle has no regional wall motion abnormalities. There is mild asymmetric left ventricular hypertrophy of the basal-septal segment. Left ventricular diastolic parameters are consistent with Grade I diastolic dysfunction (impaired relaxation). Elevated left ventricular end-diastolic pressure.  2. Right ventricular systolic function is normal. The right ventricular size is normal. There is mildly elevated pulmonary artery systolic pressure.   3. Left atrial size was mildly dilated.  4. The mitral valve is normal in structure and function. No evidence of mitral valve regurgitation. No evidence of mitral stenosis.  5. The aortic valve is tricuspid. Aortic valve regurgitation is mild. Mild to moderate aortic valve sclerosis/calcification is present, without any evidence of aortic stenosis.  6. The inferior vena cava is normal in size with greater than 50% respiratory variability, suggesting right atrial pressure of 3 mmHg. FINDINGS  Left Ventricle: Left ventricular ejection fraction, by estimation, is 60 to 65%. The left ventricle has normal function. The left ventricle has no regional wall motion abnormalities. The left ventricular internal cavity size was normal in size. There is  mild asymmetric left ventricular hypertrophy of the basal-septal segment. Left ventricular diastolic parameters are consistent with Grade I diastolic dysfunction (impaired relaxation). Elevated left ventricular end-diastolic pressure. Right Ventricle: The right ventricular size is normal. No increase in right ventricular wall thickness. Right ventricular systolic function is normal. There is mildly elevated pulmonary artery systolic pressure. The tricuspid regurgitant velocity is 2.92  m/s, and with an assumed right atrial pressure of 3 mmHg, the estimated right ventricular systolic pressure is [id number] mmHg. Left Atrium: Left atrial size was mildly dilated. Right Atrium: Right atrial size was normal in size. Pericardium: There is no evidence of pericardial effusion. Mitral Valve: The mitral valve is normal in structure and function. Normal mobility of the mitral valve leaflets. Moderate mitral annular calcification. No evidence of mitral valve regurgitation. No evidence of mitral valve stenosis. Tricuspid Valve: The tricuspid valve is normal in structure. Tricuspid valve regurgitation is mild . No evidence of tricuspid stenosis. Aortic Valve: The aortic valve is tricuspid. Aortic  valve regurgitation is mild. Mild to moderate aortic valve sclerosis/calcification is present, without any evidence of aortic stenosis. Aortic valve mean gradient measures 7.0 mmHg. Aortic valve peak gradient measures 12.5 mmHg. Aortic valve area, by VTI measures 1.58 cm. Pulmonic Valve: The pulmonic valve was normal in structure. Pulmonic valve regurgitation is not visualized. No evidence of pulmonic stenosis. Aorta: The aortic root is normal in size and structure. Venous: The inferior vena cava is normal in size with greater than 50% respiratory variability, suggesting right atrial pressure of 3 mmHg. IAS/Shunts: No atrial level shunt detected by color flow Doppler.  LEFT VENTRICLE PLAX 2D LVIDd:         3.20 cm  Diastology LVIDs:         2.40 cm  LV e' lateral:   5.44 cm/s LV PW:         1.10 cm  LV E/e' lateral: 15.6 LV  IVS:        1.10 cm  LV e' medial:    5.11 cm/s LVOT diam:     1.90 cm  LV E/e' medial:  16.6 LV SV:         62 LV SV Index:   36 LVOT Area:     2.84 cm  RIGHT VENTRICLE RV S prime:     18.80 cm/s TAPSE (M-mode): 1.8 cm LEFT ATRIUM             Index       RIGHT ATRIUM           Index LA diam:        3.80 cm 2.22 cm/m  RA Area:     11.40 cm LA Vol (A2C):   38.9 ml 22.72 ml/m RA Volume:   23.20 ml  13.55 ml/m LA Vol (A4C):   39.0 ml 22.78 ml/m LA Biplane Vol: 39.7 ml 23.19 ml/m  AORTIC VALVE AV Area (Vmax):    1.83 cm AV Area (Vmean):   1.67 cm AV Area (VTI):     1.58 cm AV Vmax:           177.00 cm/s AV Vmean:          118.000 cm/s AV VTI:            0.390 m AV Peak Grad:      12.5 mmHg AV Mean Grad:      7.0 mmHg LVOT Vmax:         114.00 cm/s LVOT Vmean:        69.700 cm/s LVOT VTI:          0.218 m LVOT/AV VTI ratio: 0.56  AORTA Ao Root diam: 3.00 cm MITRAL VALVE                TRICUSPID VALVE MV Area (PHT): 2.99 cm     TR Peak grad:   34.1 mmHg MV Decel Time: 254 msec     TR Vmax:        292.00 cm/s MV E velocity: 84.80 cm/s MV A velocity: 104.00 cm/s  SHUNTS MV E/A ratio:  0.82          Systemic VTI:  0.22 m                             Systemic Diam: 1.90 cm Jenkins Rouge MD Electronically signed by Jenkins Rouge MD Signature Date/Time: 05/11/2019/9:59:25 AM    Final      Medical Consultants:    None.  Anti-Infectives:   None  Subjective:    Melinda Edwards she has no new complaints today.  Objective:    Vitals:   05/13/19 0419 05/13/19 0544 05/13/19 0758 05/13/19 0839  BP: 126/80 108/73 105/78   Pulse: (!) 115 84 76   Resp: 18 16 20    Temp: 98.5 F (36.9 C) 97.9 F (36.6 C) 98.1 F (36.7 C)   TempSrc: Oral Oral Oral   SpO2: 95% (!) 87% 95% 97%  Weight:      Height:       SpO2: 97 % O2 Flow Rate (L/min): 0 L/min FiO2 (%): 21 %   Intake/Output Summary (Last 24 hours) at 05/13/2019 0928 Last data filed at 05/12/2019 2000 Gross per 24 hour  Intake 674.61 ml  Output --  Net 674.61 ml   Filed Weights   05/10/19 2031  Weight: 66.3 kg  Exam: General exam: In no acute distress. Respiratory system: Good air movement and clear to auscultation. Cardiovascular system: S1 & S2 heard, RRR. No JVD. Gastrointestinal system: Abdomen is nondistended, soft and nontender.  Central nervous system: Alert and oriented. No focal neurological deficits. Extremities: No pedal edema. Skin: No rashes, lesions or ulcers  Data Reviewed:    Labs: Basic Metabolic Panel: Recent Labs  Lab 05/10/19 1237 05/10/19 2048  NA 142  --   K 3.7  --   CL 108  --   CO2 23  --   GLUCOSE 132*  --   BUN 19  --   CREATININE 0.78 0.66  CALCIUM 9.1  --    GFR Estimated Creatinine Clearance: 43.8 mL/min (by C-G formula based on SCr of 0.66 mg/dL). Liver Function Tests: Recent Labs  Lab 05/10/19 1237  AST 16  ALT 14  ALKPHOS 77  BILITOT 1.1  PROT 7.8  ALBUMIN 4.1   No results for input(s): LIPASE, AMYLASE in the last 168 hours. No results for input(s): AMMONIA in the last 168 hours. Coagulation profile Recent Labs  Lab 05/10/19 1237  INR 1.0    COVID-19 Labs  No results for input(s): DDIMER, FERRITIN, LDH, CRP in the last 72 hours.  Lab Results  Component Value Date   [place] NEGATIVE 05/10/2019    CBC: Recent Labs  Lab 05/10/19 1237  WBC 8.9  NEUTROABS 6.1  HGB 13.8  HCT 42.9  MCV 86.8  PLT 221   Cardiac Enzymes: No results for input(s): CKTOTAL, CKMB, CKMBINDEX, TROPONINI in the last 168 hours. BNP (last 3 results) No results for input(s): PROBNP in the last 8760 hours. CBG: No results for input(s): GLUCAP in the last 168 hours. D-Dimer: No results for input(s): DDIMER in the last 72 hours. Hgb A1c: Recent Labs    05/11/19 0308  HGBA1C 5.6   Lipid Profile: Recent Labs    05/11/19 0308  CHOL 247*  HDL 50  LDLCALC 178*  TRIG 94  CHOLHDL 4.9   Thyroid function studies: Recent Labs    05/10/19 2048  TSH 3.837   Anemia work up: No results for input(s): VITAMINB12, FOLATE, FERRITIN, TIBC, IRON, RETICCTPCT in the last 72 hours. Sepsis Labs: Recent Labs  Lab 05/10/19 1237  WBC 8.9   Microbiology Recent Results (from the past 240 hour(s))  SARS CORONAVIRUS 2 (TAT 6-24 HRS) Nasopharyngeal Nasopharyngeal Swab     Status: None   Collection Time: 05/10/19  6:09 PM   Specimen: Nasopharyngeal Swab  Result Value Ref Range Status   SARS Coronavirus 2 NEGATIVE NEGATIVE Final    Comment: (NOTE) SARS-CoV-2 target nucleic acids are NOT DETECTED. The SARS-CoV-2 RNA is generally detectable in upper and lower respiratory specimens during the acute phase of infection. Negative results do not preclude SARS-CoV-2 infection, do not rule out co-infections with other pathogens, and should not be used as the sole basis for treatment or other patient management decisions. Negative results must be combined with clinical observations, patient history, and epidemiological information. The expected result is Negative. Fact Sheet for Patients: SugarRoll.be Fact Sheet for  Healthcare Providers: https://www.woods-mathews.com/ This test is not yet approved or cleared by the [place] FDA and  has been authorized for detection and/or diagnosis of SARS-CoV-2 by FDA under an Emergency Use Authorization (EUA). This EUA will remain  in effect (meaning this test can be used) for the duration of the COVID-19 declaration under Section 56 4(b)(1) of the Act, 21 U.S.C. section 360bbb-3(b)(1), unless  the authorization is terminated or revoked sooner. Performed at [facility], [place] [address] [place], [place] [address]      Medications:   . aspirin  324 mg Oral Once  . aspirin EC  81 mg Oral Daily  . atorvastatin  40 mg Oral q1800  . clopidogrel  300 mg Oral Once  . clopidogrel  75 mg Oral Daily  . enoxaparin (LOVENOX) injection  40 mg Subcutaneous Q24H  . sodium chloride flush  3 mL Intravenous Q12H   Continuous Infusions: . sodium chloride 10 mL/hr (05/12/19 0700)      LOS: 3 days   Charlynne Cousins  [facility]  05/13/2019, 9:28 AM

## 2019-05-13 NOTE — Progress Notes (Signed)
Pt's Tele metry strip from SR been convertederted to AFIB             rn checked on pt,was sound asleep with  No distress.   As pt got up to the bathroom HR went up to 160.s with rate and remained A-fib  MD  On call text paged  awaiting   return call.

## 2019-05-13 NOTE — Progress Notes (Signed)
New orders received and carried out.         Hr down in 80 s to 90's pt remained asymptomatic. RN will continue to monitor patient.

## 2019-05-13 NOTE — Progress Notes (Signed)
   05/13/19 0839  Vitals  ECG Heart Rate (!) 155  MEWS Score  MEWS Temp 0  MEWS Systolic 0  MEWS Pulse 3  MEWS RR 0  MEWS LOC 0  MEWS Score 3  MEWS Score Color Yellow   MD notified of increased HR.  Pt asymptomatic

## 2019-05-13 NOTE — Progress Notes (Signed)
Patient received 10 mg IV Metoprolol as per MD for AFib w/ RVR - rate 150's.  Pt asymptomatic.

## 2019-05-14 DIAGNOSIS — I480 Paroxysmal atrial fibrillation: Secondary | ICD-10-CM | POA: Insufficient documentation

## 2019-05-14 DIAGNOSIS — I4891 Unspecified atrial fibrillation: Secondary | ICD-10-CM

## 2019-05-14 DIAGNOSIS — E7800 Pure hypercholesterolemia, unspecified: Secondary | ICD-10-CM

## 2019-05-14 HISTORY — DX: Unspecified atrial fibrillation: I48.91

## 2019-05-14 MED ORDER — ASPIRIN EC 81 MG PO TBEC: 81.0000 mg | DELAYED_RELEASE_TABLET | Freq: Every day | ORAL | Status: DC

## 2019-05-14 MED ORDER — APIXABAN 5 MG PO TABS: 5.0000 mg | ORAL_TABLET | Freq: Two times a day (BID) | ORAL | Status: DC

## 2019-05-14 MED ORDER — DILTIAZEM HCL ER COATED BEADS 120 MG PO CP24: 120.0000 mg | ORAL_CAPSULE | Freq: Every day | ORAL | 11 refills | Status: AC

## 2019-05-14 NOTE — Plan of Care (Signed)
  Problem: Activity: Goal: Risk for activity intolerance will decrease Outcome: Adequate for Discharge   Problem: Clinical Measurements: Goal: Diagnostic test results will improve Outcome: Adequate for Discharge   Problem: Clinical Measurements: Goal: Will remain free from infection Outcome: Adequate for Discharge

## 2019-05-14 NOTE — Discharge Summary (Addendum)
Physician Discharge Summary  Melinda Edwards [id number] DOB: October 19, 1926 DOA: 05/10/2019  PCP: Lajean Manes, MD  Admit date: 05/10/2019 Discharge date: 05/14/2019  Admitted From: Home Disposition:  SNF  Recommendations for Outpatient Follow-up:  1. Follow up with PCP in 1-2 weeks 2. Patient will go to skilled nursing facility.  Home Health:no Equipment/Devices:None  Discharge Condition:Stable CODE STATUS: DNR Diet recommendation: Heart Healthy  Brief/Interim Summary: 86 y.o. female with past medical history of hypertension hyperlipidemia palpitation not on medication who comes into the hospital complaining of right-sided weakness.  On the day of admission she was weak and with a slurred speech last time she was seen normal was the day prior she was brought into the ED and found to have a CVA  Discharge Diagnoses:  Principal Problem:   Stroke [facility]) Active Problems:   Hyperlipidemia   Palpitations   Memory disorder   Essential hypertension   Atrial fibrillation with RVR ([place]) CVA: With an A1c of 5.5, fasting lipid panel showed an LDL greater than 150 she was started on a statin MRI showing CVA. Physical therapy evaluated the patient recommended skilled nursing facility. Speech evaluated the patient the recommended regular diet. CT angio of the neck showed no significant stenosis. Neurology was consulted and they recommended a 2D echo that showed preserved EF no clots and agree with high dose statin therapy and aspirin. During her hospital stay her she went into A. fib on telemetry she was started on diltiazem and Eliquis and she will continue these as an outpatient.  New onset atrial fibrillation with RVR: Not in atrial fibrillation on admission she was monitored on telemetry for several days and she remained in sinus rhythm, during her hospital stay she started developing A. fib with RVR she was started on diltiazem which controlled her heart rate, she was also started on  Eliquis which she will continue as an outpatient.  Her heart rate has been controlled.  Hyperlipidemia: Continue statin therapy.  Essential hypertension: She is currently on no medications now she is on Eliquis for new onset A. fib.   Discharge Instructions  Discharge Instructions    Ambulatory referral to Neurology   Complete by: As directed    Follow up with stroke clinic NP (Jessica Vanschaick or Cecille Rubin, if both not available, consider Zachery Dauer, or Ahern) at [facility] in about 4 weeks. Thanks.   Diet - low sodium heart healthy   Complete by: As directed    Increase activity slowly   Complete by: As directed      Allergies as of 05/14/2019      Reactions   Loratadine Other (See Comments)   Nose bleed      Medication List    STOP taking these medications   digoxin 0.125 MG tablet Commonly known as: LANOXIN     TAKE these medications   apixaban 5 MG Tabs tablet Commonly known as: ELIQUIS Take 1 tablet (5 mg total) by mouth 2 (two) times daily.   aspirin EC 81 MG tablet Take 1 tablet (81 mg total) by mouth daily.   atorvastatin 10 MG tablet Commonly known as: LIPITOR Take 1 tablet (10 mg total) by mouth daily.   diltiazem 120 MG 24 hr capsule Commonly known as: Cardizem CD Take 1 capsule (120 mg total) by mouth daily.   potassium chloride SA 20 MEQ tablet Commonly known as: KLOR-CON Take 1 tablet (20 mEq total) by mouth daily.      Follow-up Information    [facility]. Schedule an appointment as soon as possible for a visit in 4 week(s).   Specialty: Neurology Contact information: [place] [phone]         Allergies  Allergen Reactions  . Loratadine Other (See Comments)    Nose bleed    Consultations:  Neurology   Procedures/Studies: CT Angio Head W or Wo Contrast  Result Date: 05/10/2019 CLINICAL DATA:  Acute infarct, follow-up EXAM: CT ANGIOGRAPHY HEAD AND  NECK TECHNIQUE: Multidetector CT imaging of the head and neck was performed using the standard protocol during bolus administration of intravenous contrast. Multiplanar CT image reconstructions and MIPs were obtained to evaluate the vascular anatomy. Carotid stenosis measurements (when applicable) are obtained utilizing NASCET criteria, using the distal internal carotid diameter as the denominator. CONTRAST:  45mL OMNIPAQUE IOHEXOL 350 MG/ML SOLN COMPARISON:  Earlier same day CT head FINDINGS: CT HEAD FINDINGS Brain: Small infarction involving the left basal ganglia and adjacent white matter is again identified. There is no new loss of gray-white differentiation. There is no acute intracranial hemorrhage or mass effect. Stable findings of chronic microvascular ischemic changes and parenchymal volume loss. Vascular: No new findings. Skull: Unremarkable. Sinuses: No acute abnormality. Orbits: No acute abnormality Review of the MIP images confirms the above findings CTA NECK FINDINGS Aortic arch: Great vessel origins are patent. Right carotid system: Patent. Calcified plaque at the bifurcation. There is no measurable stenosis at the ICA origin. Left carotid system: Patent. No significant plaque or measurable stenosis at the ICA origin. Vertebral arteries: Patent and codominant. Skeleton: Multilevel cervical spine degenerative changes. Other neck: 1 cm left thyroid nodule.  No neck mass or adenopathy. Upper chest: Scarring at the lung apices. Review of the MIP images confirms the above findings CTA HEAD FINDINGS Anterior circulation: Intracranial internal carotid arteries are patent with mild calcified plaque. Anterior and middle cerebral arteries are patent. There is multifocal atherosclerotic irregularity and stenoses. For example, moderate to severe short segment stenosis of the proximal right M1 MCA. Focal moderate stenosis of the left M1 MCA. Moderate to marked stenosis of the right A2 ACA. Additional stenoses are  present more distally. Question of a 2 mm broad-based shallow inferiorly directed outpouching from the distal supraclinoid left ICA versus atherosclerotic irregularity. Posterior circulation: Intracranial vertebral arteries, basilar artery, and posterior cerebral arteries are patent. There is atherosclerotic irregularity and multifocal stenoses including diffuse mild to moderate stenosis of the right P2 PCA and least moderate stenosis at the left P2 PCA branch point. Venous sinuses: As permitted by contrast timing, patent. Review of the MIP images confirms the above findings IMPRESSION: Evolving small infarction of the left basal ganglia and adjacent white matter. No acute intracranial hemorrhage. Stable chronic findings detailed above. No hemodynamically significant stenosis in the neck. Multifocal intracranial atherosclerosis involving anterior and posterior circulations. Question of a 2 mm broad-based outpouching from the distal supraclinoid left ICA versus atherosclerotic irregularity. Electronically Signed   By: Macy Mis M.D.   On: 05/10/2019 18:35   CT HEAD WO CONTRAST  Result Date: 05/10/2019 CLINICAL DATA:  Right-sided weakness. EXAM: CT HEAD WITHOUT CONTRAST TECHNIQUE: Contiguous axial images were obtained from the base of the skull through the vertex without intravenous contrast. COMPARISON:  None. FINDINGS: Brain: Mild diffuse cortical atrophy is noted. Mild chronic ischemic white matter disease is noted. No mass effect or midline shift is noted. Ventricular size is within normal limits. There is no evidence of mass lesion, hemorrhage or acute  infarction. Vascular: No hyperdense vessel or unexpected calcification. Skull: Normal. Negative for fracture or focal lesion. Sinuses/Orbits: No acute finding. Other: None. IMPRESSION: Mild diffuse cortical atrophy. Mild chronic ischemic white matter disease. No acute intracranial abnormality seen. Electronically Signed   By: Marijo Conception M.D.   On:  05/10/2019 12:56   CT Angio Neck W and/or Wo Contrast  Result Date: 05/10/2019 CLINICAL DATA:  Acute infarct, follow-up EXAM: CT ANGIOGRAPHY HEAD AND NECK TECHNIQUE: Multidetector CT imaging of the head and neck was performed using the standard protocol during bolus administration of intravenous contrast. Multiplanar CT image reconstructions and MIPs were obtained to evaluate the vascular anatomy. Carotid stenosis measurements (when applicable) are obtained utilizing NASCET criteria, using the distal internal carotid diameter as the denominator. CONTRAST:  10mL OMNIPAQUE IOHEXOL 350 MG/ML SOLN COMPARISON:  Earlier same day CT head FINDINGS: CT HEAD FINDINGS Brain: Small infarction involving the left basal ganglia and adjacent white matter is again identified. There is no new loss of gray-white differentiation. There is no acute intracranial hemorrhage or mass effect. Stable findings of chronic microvascular ischemic changes and parenchymal volume loss. Vascular: No new findings. Skull: Unremarkable. Sinuses: No acute abnormality. Orbits: No acute abnormality Review of the MIP images confirms the above findings CTA NECK FINDINGS Aortic arch: Great vessel origins are patent. Right carotid system: Patent. Calcified plaque at the bifurcation. There is no measurable stenosis at the ICA origin. Left carotid system: Patent. No significant plaque or measurable stenosis at the ICA origin. Vertebral arteries: Patent and codominant. Skeleton: Multilevel cervical spine degenerative changes. Other neck: 1 cm left thyroid nodule.  No neck mass or adenopathy. Upper chest: Scarring at the lung apices. Review of the MIP images confirms the above findings CTA HEAD FINDINGS Anterior circulation: Intracranial internal carotid arteries are patent with mild calcified plaque. Anterior and middle cerebral arteries are patent. There is multifocal atherosclerotic irregularity and stenoses. For example, moderate to severe short segment  stenosis of the proximal right M1 MCA. Focal moderate stenosis of the left M1 MCA. Moderate to marked stenosis of the right A2 ACA. Additional stenoses are present more distally. Question of a 2 mm broad-based shallow inferiorly directed outpouching from the distal supraclinoid left ICA versus atherosclerotic irregularity. Posterior circulation: Intracranial vertebral arteries, basilar artery, and posterior cerebral arteries are patent. There is atherosclerotic irregularity and multifocal stenoses including diffuse mild to moderate stenosis of the right P2 PCA and least moderate stenosis at the left P2 PCA branch point. Venous sinuses: As permitted by contrast timing, patent. Review of the MIP images confirms the above findings IMPRESSION: Evolving small infarction of the left basal ganglia and adjacent white matter. No acute intracranial hemorrhage. Stable chronic findings detailed above. No hemodynamically significant stenosis in the neck. Multifocal intracranial atherosclerosis involving anterior and posterior circulations. Question of a 2 mm broad-based outpouching from the distal supraclinoid left ICA versus atherosclerotic irregularity. Electronically Signed   By: Macy Mis M.D.   On: 05/10/2019 18:35   MR BRAIN WO CONTRAST  Result Date: 05/10/2019 CLINICAL DATA:  Possible stroke with right-sided weakness EXAM: MRI HEAD WITHOUT CONTRAST TECHNIQUE: Multiplanar, multiecho pulse sequences of the brain and surrounding structures were obtained without intravenous contrast. COMPARISON:  None. FINDINGS: Brain: There is a small acute infarction involving the left basal ganglia and adjacent white matter. No evidence of intracranial hemorrhage. There is no intracranial mass, mass effect, or edema. There is no hydrocephalus or extra-axial fluid collection. Prominence of the ventricles and sulci reflects generalized  parenchymal volume loss. Patchy and confluent areas of T2 hyperintensity in the supratentorial  white matter are nonspecific but may reflect mild to moderate chronic microvascular ischemic changes. Vascular: Major vessel flow voids at the skull base are preserved. Skull and upper cervical spine: Normal marrow signal is preserved. Sinuses/Orbits: Trace mucosal thickening. Bilateral lens replacements. Other: Sella is unremarkable.  Mastoid air cells are clear. IMPRESSION: Small acute infarction involving the left basal ganglia and adjacent white matter. Chronic microvascular ischemic changes. Electronically Signed   By: Macy Mis M.D.   On: 05/10/2019 16:09   DG Chest Portable 1 View  Result Date: 05/10/2019 CLINICAL DATA:  New onset of right-sided weakness and slurred speech. EXAM: PORTABLE CHEST 1 VIEW COMPARISON:  Chest x-ray dated 11/30/2017 FINDINGS: The heart size and pulmonary vascularity are normal and the lungs are clear. Chronic bilateral apical pleural thickening. No significant bone abnormality. IMPRESSION: No acute abnormality. Electronically Signed   By: Lorriane Shire M.D.   On: 05/10/2019 13:22   ECHOCARDIOGRAM COMPLETE  Result Date: 05/11/2019    ECHOCARDIOGRAM REPORT   Patient Name:   Melinda Edwards Date of Exam: 05/11/2019 Medical Rec #:  [id number]       Height:       64.0 in Accession #:    [id number]      Weight:       146.2 lb Date of Birth:  1926-08-20        BSA:          1.712 m Patient Age:    4 years        BP:           140/55 mmHg Patient Gender: F               HR:           45 bpm. Exam Location:  Inpatient Procedure: 2D Echo Indications:    Stroke 434.91 / I163.9  History:        Patient has no prior history of Echocardiogram examinations.                 COPD, Arrythmias:Palpitations; Risk Factors:Hypertension and                 Dyslipidemia.  Sonographer:    Vikki Ports Turrentine Referring Phys: [id number] [place]  1. Left ventricular ejection fraction, by estimation, is 60 to 65%. The left ventricle has normal function. The left ventricle has no  regional wall motion abnormalities. There is mild asymmetric left ventricular hypertrophy of the basal-septal segment. Left ventricular diastolic parameters are consistent with Grade I diastolic dysfunction (impaired relaxation). Elevated left ventricular end-diastolic pressure.  2. Right ventricular systolic function is normal. The right ventricular size is normal. There is mildly elevated pulmonary artery systolic pressure.  3. Left atrial size was mildly dilated.  4. The mitral valve is normal in structure and function. No evidence of mitral valve regurgitation. No evidence of mitral stenosis.  5. The aortic valve is tricuspid. Aortic valve regurgitation is mild. Mild to moderate aortic valve sclerosis/calcification is present, without any evidence of aortic stenosis.  6. The inferior vena cava is normal in size with greater than 50% respiratory variability, suggesting right atrial pressure of 3 mmHg. FINDINGS  Left Ventricle: Left ventricular ejection fraction, by estimation, is 60 to 65%. The left ventricle has normal function. The left ventricle has no regional wall motion abnormalities. The left ventricular internal cavity size was normal in size. There is  mild asymmetric left ventricular hypertrophy of the basal-septal segment. Left ventricular diastolic parameters are consistent with Grade I diastolic dysfunction (impaired relaxation). Elevated left ventricular end-diastolic pressure. Right Ventricle: The right ventricular size is normal. No increase in right ventricular wall thickness. Right ventricular systolic function is normal. There is mildly elevated pulmonary artery systolic pressure. The tricuspid regurgitant velocity is 2.92  m/s, and with an assumed right atrial pressure of 3 mmHg, the estimated right ventricular systolic pressure is [id number] mmHg. Left Atrium: Left atrial size was mildly dilated. Right Atrium: Right atrial size was normal in size. Pericardium: There is no evidence of pericardial  effusion. Mitral Valve: The mitral valve is normal in structure and function. Normal mobility of the mitral valve leaflets. Moderate mitral annular calcification. No evidence of mitral valve regurgitation. No evidence of mitral valve stenosis. Tricuspid Valve: The tricuspid valve is normal in structure. Tricuspid valve regurgitation is mild . No evidence of tricuspid stenosis. Aortic Valve: The aortic valve is tricuspid. Aortic valve regurgitation is mild. Mild to moderate aortic valve sclerosis/calcification is present, without any evidence of aortic stenosis. Aortic valve mean gradient measures 7.0 mmHg. Aortic valve peak gradient measures 12.5 mmHg. Aortic valve area, by VTI measures 1.58 cm. Pulmonic Valve: The pulmonic valve was normal in structure. Pulmonic valve regurgitation is not visualized. No evidence of pulmonic stenosis. Aorta: The aortic root is normal in size and structure. Venous: The inferior vena cava is normal in size with greater than 50% respiratory variability, suggesting right atrial pressure of 3 mmHg. IAS/Shunts: No atrial level shunt detected by color flow Doppler.  LEFT VENTRICLE PLAX 2D LVIDd:         3.20 cm  Diastology LVIDs:         2.40 cm  LV e' lateral:   5.44 cm/s LV PW:         1.10 cm  LV E/e' lateral: 15.6 LV IVS:        1.10 cm  LV e' medial:    5.11 cm/s LVOT diam:     1.90 cm  LV E/e' medial:  16.6 LV SV:         62 LV SV Index:   36 LVOT Area:     2.84 cm  RIGHT VENTRICLE RV S prime:     18.80 cm/s TAPSE (M-mode): 1.8 cm LEFT ATRIUM             Index       RIGHT ATRIUM           Index LA diam:        3.80 cm 2.22 cm/m  RA Area:     11.40 cm LA Vol (A2C):   38.9 ml 22.72 ml/m RA Volume:   23.20 ml  13.55 ml/m LA Vol (A4C):   39.0 ml 22.78 ml/m LA Biplane Vol: 39.7 ml 23.19 ml/m  AORTIC VALVE AV Area (Vmax):    1.83 cm AV Area (Vmean):   1.67 cm AV Area (VTI):     1.58 cm AV Vmax:           177.00 cm/s AV Vmean:          118.000 cm/s AV VTI:            0.390 m AV  Peak Grad:      12.5 mmHg AV Mean Grad:      7.0 mmHg LVOT Vmax:         114.00 cm/s LVOT Vmean:  69.700 cm/s LVOT VTI:          0.218 m LVOT/AV VTI ratio: 0.56  AORTA Ao Root diam: 3.00 cm MITRAL VALVE                TRICUSPID VALVE MV Area (PHT): 2.99 cm     TR Peak grad:   34.1 mmHg MV Decel Time: 254 msec     TR Vmax:        292.00 cm/s MV E velocity: 84.80 cm/s MV A velocity: 104.00 cm/s  SHUNTS MV E/A ratio:  0.82         Systemic VTI:  0.22 m                             Systemic Diam: 1.90 cm Jenkins Rouge MD Electronically signed by Jenkins Rouge MD Signature Date/Time: 05/11/2019/9:59:25 AM    Final    2D echo showed preserved EF.   Subjective: No complaints.  Discharge Exam: Vitals:   05/14/19 0350 05/14/19 0808  BP: 136/86 125/63  Pulse: 67 68  Resp: 16 18  Temp: 98 F (36.7 C) 98.6 F (37 C)  SpO2: 93% 96%   Vitals:   05/13/19 1950 05/13/19 2329 05/14/19 0350 05/14/19 0808  BP: (!) 145/86 (!) 104/56 136/86 125/63  Pulse: 69 64 67 68  Resp: 15 16 16 18   Temp: 98.3 F (36.8 C) (!) 97.4 F (36.3 C) 98 F (36.7 C) 98.6 F (37 C)  TempSrc: Oral Oral Oral Oral  SpO2: 91% 94% 93% 96%  Weight:      Height:        General: Pt is alert, awake, not in acute distress Cardiovascular: RRR, S1/S2 +, no rubs, no gallops Respiratory: CTA bilaterally, no wheezing, no rhonchi Abdominal: Soft, NT, ND, bowel sounds + Extremities: no edema, no cyanosis    The results of significant diagnostics from this hospitalization (including imaging, microbiology, ancillary and laboratory) are listed below for reference.     Microbiology: Recent Results (from the past 240 hour(s))  SARS CORONAVIRUS 2 (TAT 6-24 HRS) Nasopharyngeal Nasopharyngeal Swab     Status: None   Collection Time: 05/10/19  6:09 PM   Specimen: Nasopharyngeal Swab  Result Value Ref Range Status   SARS Coronavirus 2 NEGATIVE NEGATIVE Final    Comment: (NOTE) SARS-CoV-2 target nucleic acids are NOT DETECTED. The  SARS-CoV-2 RNA is generally detectable in upper and lower respiratory specimens during the acute phase of infection. Negative results do not preclude SARS-CoV-2 infection, do not rule out co-infections with other pathogens, and should not be used as the sole basis for treatment or other patient management decisions. Negative results must be combined with clinical observations, patient history, and epidemiological information. The expected result is Negative. Fact Sheet for Patients: SugarRoll.be Fact Sheet for Healthcare Providers: https://www.woods-mathews.com/ This test is not yet approved or cleared by the [place] FDA and  has been authorized for detection and/or diagnosis of SARS-CoV-2 by FDA under an Emergency Use Authorization (EUA). This EUA will remain  in effect (meaning this test can be used) for the duration of the COVID-19 declaration under Section 56 4(b)(1) of the Act, 21 U.S.C. section 360bbb-3(b)(1), unless the authorization is terminated or revoked sooner. Performed at [facility], [place] [address], [place],  [address]      Labs: BNP (last 3 results) No results for input(s): BNP in the last 8760 hours. Basic Metabolic Panel: Recent  Labs  Lab 05/10/19 1237 05/10/19 2048  NA 142  --   K 3.7  --   CL 108  --   CO2 23  --   GLUCOSE 132*  --   BUN 19  --   CREATININE 0.78 0.66  CALCIUM 9.1  --    Liver Function Tests: Recent Labs  Lab 05/10/19 1237  AST 16  ALT 14  ALKPHOS 77  BILITOT 1.1  PROT 7.8  ALBUMIN 4.1   No results for input(s): LIPASE, AMYLASE in the last 168 hours. No results for input(s): AMMONIA in the last 168 hours. CBC: Recent Labs  Lab 05/10/19 1237  WBC 8.9  NEUTROABS 6.1  HGB 13.8  HCT 42.9  MCV 86.8  PLT 221   Cardiac Enzymes: No results for input(s): CKTOTAL, CKMB, CKMBINDEX, TROPONINI in the last 168 hours. BNP: Invalid input(s): POCBNP CBG: No results for  input(s): GLUCAP in the last 168 hours. D-Dimer No results for input(s): DDIMER in the last 72 hours. Hgb A1c No results for input(s): HGBA1C in the last 72 hours. Lipid Profile No results for input(s): CHOL, HDL, LDLCALC, TRIG, CHOLHDL, LDLDIRECT in the last 72 hours. Thyroid function studies No results for input(s): TSH, T4TOTAL, T3FREE, THYROIDAB in the last 72 hours.  Invalid input(s): FREET3 Anemia work up No results for input(s): VITAMINB12, FOLATE, FERRITIN, TIBC, IRON, RETICCTPCT in the last 72 hours. Urinalysis    Component Value Date/Time   COLORURINE YELLOW 05/10/2019 1219   APPEARANCEUR HAZY (A) 05/10/2019 1219   LABSPEC 1.019 05/10/2019 1219   PHURINE 5.0 05/10/2019 1219   GLUCOSEU NEGATIVE 05/10/2019 1219   HGBUR NEGATIVE 05/10/2019 1219   BILIRUBINUR NEGATIVE 05/10/2019 1219   KETONESUR 5 (A) 05/10/2019 1219   PROTEINUR NEGATIVE 05/10/2019 1219   NITRITE NEGATIVE 05/10/2019 1219   LEUKOCYTESUR SMALL (A) 05/10/2019 1219   Sepsis Labs Invalid input(s): PROCALCITONIN,  WBC,  LACTICIDVEN Microbiology Recent Results (from the past 240 hour(s))  SARS CORONAVIRUS 2 (TAT 6-24 HRS) Nasopharyngeal Nasopharyngeal Swab     Status: None   Collection Time: 05/10/19  6:09 PM   Specimen: Nasopharyngeal Swab  Result Value Ref Range Status   SARS Coronavirus 2 NEGATIVE NEGATIVE Final    Comment: (NOTE) SARS-CoV-2 target nucleic acids are NOT DETECTED. The SARS-CoV-2 RNA is generally detectable in upper and lower respiratory specimens during the acute phase of infection. Negative results do not preclude SARS-CoV-2 infection, do not rule out co-infections with other pathogens, and should not be used as the sole basis for treatment or other patient management decisions. Negative results must be combined with clinical observations, patient history, and epidemiological information. The expected result is Negative. Fact Sheet for  Patients: SugarRoll.be Fact Sheet for Healthcare Providers: https://www.woods-mathews.com/ This test is not yet approved or cleared by the [place] FDA and  has been authorized for detection and/or diagnosis of SARS-CoV-2 by FDA under an Emergency Use Authorization (EUA). This EUA will remain  in effect (meaning this test can be used) for the duration of the COVID-19 declaration under Section 56 4(b)(1) of the Act, 21 U.S.C. section 360bbb-3(b)(1), unless the authorization is terminated or revoked sooner. Performed at [facility], [place] [address], [place], [place] [address]      Time coordinating discharge: Over 40 minutes  SIGNED:   Charlynne Cousins, MD  [facility] 05/14/2019, 11:16 AM Pager   If 7PM-7AM, please contact night-coverage [email or web address] Password [email or web address]

## 2019-05-14 NOTE — Progress Notes (Signed)
Occupational Therapy Treatment Patient Details Name: Melinda Edwards MRN: [id number] DOB: Oct 09, 1926 Today's Date: 05/14/2019    History of present illness Pt is a 86 yo female presenting due to onset of gradual R-sided weakness and fall at ALF. Imaging revealed small acute L basal ganglia infarct. PMH includes: HTN and HLD.   OT comments  Pt making steady progress towards OT goals this session. Pt presents with decreased activity tolerance, generalized weakness and decreased ability to care for self impacting ability to engage in BADLs. Overall, pt requires min guard assist for household distance functional mobility with 4 wheeled RW. Pt requires MIN A for UB ADLS and MIN A for LB ADL with AE. Pt likely to DC back to ALF with 24 hour assist, will follow acutely per POC.    Follow Up Recommendations  SNF;Other (comment)(v. HHOT if someone can be with her 24/7 at least until they feel she is/can be left alone (safety is the concern))    Equipment Recommendations  None recommended by OT    Recommendations for Other Services      Precautions / Restrictions Precautions Precautions: Fall Precaution Comments: HOH Restrictions Weight Bearing Restrictions: No       Mobility Bed Mobility Overal bed mobility: Needs Assistance Bed Mobility: Supine to Sit;Sit to Supine     Supine to sit: Min assist;HOB elevated Sit to supine: Supervision   General bed mobility comments: light MIN A to scoot hips to EOB and elevatetrunk; pt returned self to supine with S for safety  Transfers Overall transfer level: Needs assistance Equipment used: 4-wheeled walker Transfers: Sit to/from Stand Sit to Stand: Min guard         General transfer comment: min guard for sit<>stand from EOB needing cues to not pull up on RW with little carryover    Balance Overall balance assessment: Mild deficits observed, not formally tested                                         ADL either  performed or assessed with clinical judgement   ADL Overall ADL's : Needs assistance/impaired                 Upper Body Dressing : Minimal assistance;Sitting Upper Body Dressing Details (indicate cue type and reason): to pull down shirt in back and fasten bra Lower Body Dressing: Minimal assistance;With adaptive equipment;Sit to/from stand Lower Body Dressing Details (indicate cue type and reason): to don shoes with LH shoe horn EOB Toilet Transfer: Min guard;Ambulation;RW Toilet Transfer Details (indicate cue type and reason): simulated via functional mobility from EOB>doorway with 4 wheeled RW and min guard for balance. cues to not pull up on 4 wheeled RW with little carryover         Functional mobility during ADLs: Min guard;Rolling walker(4 wheeled RW) General ADL Comments: pt presents with decreased activity tolerance, generalized weakness and decreased ability to care for self impacting ability to engage in BADLs     Vision Baseline Vision/History: Wears glasses Wears Glasses: At all times     Perception     Praxis      Cognition Arousal/Alertness: Awake/alert Behavior During Therapy: [facility] for tasks assessed/performed Overall Cognitive Status: History of cognitive impairments - at baseline  General Comments: Pt pleasant, following all commands, HOH        Exercises     Shoulder Instructions       General Comments pts POA present very helpful and supportive. Pt and POA declining 2 wheeled RW as pt cannot open/ close on own. POA letting pt use 4 wheeled RW, education provided on safety concerns related to 4 wheeled RW with POA verbalizing understanding    Pertinent Vitals/ Pain       Pain Assessment: Faces Faces Pain Scale: No hurt  Home Living                                          Prior Functioning/Environment              Frequency  Min 2X/week        Progress Toward  Goals  OT Goals(current goals can now be found in the care plan section)  Progress towards OT goals: Progressing toward goals  Acute Rehab OT Goals Patient Stated Goal: Pt POA would like her to have 24/7 assist OT Goal Formulation: Patient unable to participate in goal setting Time For Goal Achievement: 05/25/19 Potential to Achieve Goals: Good  Plan Discharge plan remains appropriate    Co-evaluation                 AM-PAC OT "6 Clicks" Daily Activity     Outcome Measure   Help from another person eating meals?: None   Help from another person toileting, which includes using toliet, bedpan, or urinal?: A Little   Help from another person to put on and taking off regular upper body clothing?: A Little Help from another person to put on and taking off regular lower body clothing?: A Little 6 Click Score: 13    End of Session Equipment Utilized During Treatment: Rolling walker  OT Visit Diagnosis: Unsteadiness on feet (R26.81);Muscle weakness (generalized) (M62.81);Other symptoms and signs involving cognitive function   Activity Tolerance Patient tolerated treatment well   Patient Left in chair;with call bell/phone within reach;with family/visitor present   Nurse Communication Mobility status        Time: [id number] OT Time Calculation (min): 21 min  Charges: OT General Charges $OT Visit: 1 Visit OT Treatments $Self Care/Home Management : 8-22 mins  Lanier Clam., COTA/L Acute Rehabilitation Services [phone]    Ihor Gully 05/14/2019, 3:53 PM

## 2019-05-14 NOTE — Social Work (Signed)
Clinical Social Worker facilitated patient discharge including contacting patient family and facility to confirm patient discharge plans.  Clinical information faxed to facility and family agreeable with plan.  Pt friend to transport back to [facility].  RN to call [phone]  with report prior to discharge.  Clinical Social Worker will sign off for now as social work intervention is no longer needed. Please consult [place] again if new need arises.  Westley Hummer, MSW, LCSW Clinical Social Worker

## 2019-05-14 NOTE — Plan of Care (Signed)
Adequate for discharge.

## 2019-05-14 NOTE — NC FL2 (Addendum)
  [place] MEDICAID FL2 LEVEL OF CARE SCREENING TOOL     IDENTIFICATION  Patient Name: Melinda Edwards Birthdate: 12-16-1926 Sex: female Admission Date (Current Location): 05/10/2019  [facility] and [place] Number:  Herbalist and Address:  [facility], [place] [address] [place], [place] [address]      Provider Number: [id number]  Attending Physician Name and Address:  Charlynne Cousins, MD  Relative Name and Phone Number:  [name] [phone]    Current Level of Care: Hospital Recommended Level of Care: [place] Prior Approval Number:    Date Approved/Denied:   PASRR Number:    Discharge Plan: Other (Comment)(ALF)    Current Diagnoses: Patient Active Problem List   Diagnosis Date Noted  . Stroke ([place]) 05/10/2019  . Essential hypertension 05/10/2019  . Memory disorder 10/03/2013  . Nocturnal leg cramps 08/08/2012  . Aortic stenosis, mild 04/13/2011  . Benign hypertensive heart disease without heart failure 08/13/2010  . Hyperlipidemia 08/13/2010  . Palpitations 08/13/2010  . Bronchiectasis without acute exacerbation ([place]) 11/15/2007    Orientation RESPIRATION BLADDER Height & Weight     Place, Self  Normal Continent Weight: 146 lb 2.6 oz (66.3 kg) Height:  5\' 4"  (162.6 cm)  BEHAVIORAL SYMPTOMS/MOOD NEUROLOGICAL BOWEL NUTRITION STATUS      Continent Diet  AMBULATORY STATUS COMMUNICATION OF NEEDS Skin   Limited Assist Verbally Normal                       Personal Care Assistance Level of Assistance  Bathing, Feeding, Dressing Bathing Assistance: Limited assistance Feeding assistance: Independent Dressing Assistance: Limited assistance     Functional Limitations Info  Sight, Hearing, Speech Sight Info: Impaired(Left eye) Hearing Info: Impaired(Right ear) Speech Info: Adequate    SPECIAL CARE FACTORS FREQUENCY  PT (By licensed PT), OT (By licensed OT)     PT Frequency: 5x a week OT Frequency: 5x  a week            Contractures Contractures Info: Not present    Additional Factors Info  Code Status, Allergies Code Status Info: DNR Allergies Info: Loratadine           Current Medications (05/14/2019):    Discharge Medications: STOP taking these medications   digoxin 0.125 MG tablet Commonly known as: LANOXIN     TAKE these medications   apixaban 5 MG Tabs tablet Commonly known as: ELIQUIS Take 1 tablet (5 mg total) by mouth 2 (two) times daily.   aspirin EC 81 MG tablet Take 1 tablet (81 mg total) by mouth daily.   atorvastatin 10 MG tablet Commonly known as: LIPITOR Take 1 tablet (10 mg total) by mouth daily.   diltiazem 120 MG 24 hr capsule Commonly known as: Cardizem CD Take 1 capsule (120 mg total) by mouth daily.   potassium chloride SA 20 MEQ tablet Commonly known as: KLOR-CON Take 1 tablet (20 mEq total) by mouth daily.     Relevant Imaging Results:  Relevant Lab Results:   Additional Information SSN [id number]  [place], LCSW

## 2019-05-14 NOTE — TOC Transition Note (Addendum)
Transition of Care [facility]) - CM/SW Discharge Note   Patient Details  Name: Melinda Edwards MRN: [id number] Date of Birth: 1926/06/13  Transition of Care [facility]) CM/SW Contact:  Alexander Mt, LCSW Phone Number: 05/14/2019, 12:10 PM   Clinical Narrative:    CSW spoke with pt POA and friend Melinda Edwards via telephone. Introduced self, role, reason for call. Pt friend aware of referral to [facility] for therapies, she will speak with [facility] about arranging [facility] to provide more assistance. Pt recs for a rolling walker. Have requested FL2 cosign, HHPT/OT, and face to face. [facility] aware of referral as is [place] for rolling walker.   Also confirmed that Melinda Edwards is not pt legal guardian rather a long time friend with POA. Deactivated banner.  1:53pm Pt walker was delivered to room, pt friend declined it.    Final next level of care: Assisted Living(w/ home health) Barriers to Discharge: Barriers Resolved   Patient Goals and CMS Choice Patient states their goals for this hospitalization and ongoing recovery are:: return to [facility] CMS Medicare.gov Compare Post Acute Care list provided to:: Patient Represenative (must comment)(pt friend Melinda Edwards) Choice offered to / list presented to : [place] / [place]  Discharge Placement     Patient chooses bed at: Other - please specify in the comment section below:([facility]) Patient to be transferred to facility by: personal vehicle Name of family member notified: pt POA Pat Patient and family notified of of transfer: 05/14/19  Discharge Plan and Services      DME Arranged: Gilford Rile rolling DME Agency: [employer] Date DME Agency Contacted: 05/14/19 Time DME Agency Contacted: 22 Representative spoke with at DME Agency: [place]: PT, OT [place] Agency: [facility] Date Tensed: 05/14/19 Time [place]: 50 Representative spoke with at [place]: Pollie Meyer   Readmission Risk  Interventions No flowsheet data found.

## 2019-06-14 ENCOUNTER — Inpatient Hospital Stay: Payer: Self-pay | Admitting: Adult Health

## 2019-06-14 ENCOUNTER — Other Ambulatory Visit: Payer: Self-pay

## 2019-06-14 ENCOUNTER — Ambulatory Visit: Payer: Medicare PPO | Admitting: Adult Health

## 2019-06-14 ENCOUNTER — Encounter: Payer: Self-pay | Admitting: Adult Health

## 2019-06-14 VITALS — BP 130/72 | HR 73 | Temp 97.4°F | Ht 64.5 in | Wt 145.0 lb

## 2019-06-14 DIAGNOSIS — I6381 Other cerebral infarction due to occlusion or stenosis of small artery: Secondary | ICD-10-CM

## 2019-06-14 DIAGNOSIS — I10 Essential (primary) hypertension: Secondary | ICD-10-CM | POA: Diagnosis not present

## 2019-06-14 DIAGNOSIS — I639 Cerebral infarction, unspecified: Secondary | ICD-10-CM

## 2019-06-14 DIAGNOSIS — I480 Paroxysmal atrial fibrillation: Secondary | ICD-10-CM

## 2019-06-14 DIAGNOSIS — E785 Hyperlipidemia, unspecified: Secondary | ICD-10-CM | POA: Diagnosis not present

## 2019-06-14 NOTE — Progress Notes (Signed)
[facility] [address] [place]. [place] [address] [phone]       HOSPITAL FOLLOW UP NOTE  Ms. Melinda Edwards Date of Birth:  April 21, 1926 Medical Record Number:  [id number]   Reason for Referral:  hospital stroke follow up    CHIEF COMPLAINT:  Chief Complaint  Patient presents with  . Cerebrovascular Accident    rm [place], living at [place], getting PT/OT    HPI:   Ms. Melinda Edwards is a 86 y.o. female with history of HTN, HLD  presented on 05/10/2019 with R sided weakness and unsteady gait following a fall on Saturday (05/05/2019) at ALF.  Evaluated by stroke team and Dr. Erlinda Hong with stroke work-up revealing left BG/CR infarct secondary to small vessel disease.  CTA head/neck showed asymptomatic diffuse intracranial arthrosclerosis and questionable supraclinoid left ICA aneurysm versus irregularity.  Initially recommended DAPT for 3 weeks and aspirin alone but was found in A. fib and RVR on telemetry during admission and initiating Eliquis for secondary stroke prevention.  HTN stable without medication management needed.  LDL 178 and recommended starting atorvastatin 10mg  daily.  No history of DM.  No prior history of stroke.  Baseline memory disorder with dementia stable through admission.  She was discharged to SNF for ongoing therapy needs.  Stroke:   L BG/CR infarct secondary to small vessel disease source  CT head No acute abnormality. Small vessel disease. Atrophy.   MRI  Small L BG/CR infarct. Small vessel disease.   CTA head & neck evolving L basal ganglia and adjacent white matter infarct.  Diffuse intracranial atherosclerosis with severe right MCA, right PCA severe stenosis, left MCA mild to moderate stenosis. ? Supraclinoid L ICA aneurysm vs irregularity  2D Echo EF 60-65%. No source of embolus   LDL 178 -increase atorvastatin to 40 mg daily  HgbA1c 5.6  Lovenox 40 mg sq daily for VTE prophylaxis  No antithrombotic prior  to admission, new onset A. fib with RVR and recommended initiating Eliquis  Therapy recommendations:  Discharge to SNF for therapy needs  Melinda Edwards is a 86 year old female who is being seen today, 06/14/2019, for hospital follow-up accompanied by her POA. Has been receiving PT at [facility] at [place]. She does have home sitters staying from 8am-8pm for any needed assistance and safety.Residual deficits of right sided weakness but overall improvement.  She continues to work with therapies at [facility].  Ongoing use of rolling walker and denies any recent falls.  Baseline dementia has been stable.  Continues on aspirin and Eliquis without bleeding or bruising.  Continues on atorvastatin 10 mg daily without myalgias.  Blood pressure today 130/72.  Denies new or worsening stroke/TIA symptoms.      ROS:   14 system review of systems performed and negative with exception of weakness, gait impairment and memory loss  PMH:  Past Medical History:  Diagnosis Date  . Hyperlipidemia   . Hypertension   . PAC (premature atrial contraction)    [facility]  . Palpitations   . Pulmonary nodule   . Stroke [facility]) 05/10/2019    PSH:  Past Surgical History:  Procedure Laterality Date  . BREAST EXCISIONAL BIOPSY Right 2003  . DILATION AND CURETTAGE OF UTERUS      Social History:  Social History   Socioeconomic History  . Marital status: Single    Spouse name: Not on file  . Number of children: 0  . Years of education: Not on  file  . Highest education level: Not on file  Occupational History  . Occupation: Retired  Tobacco Use  . Smoking status: Former Smoker    Packs/day: 1.00    Types: Cigarettes    Quit date: 08/12/1995    Years since quitting: 23.8  . Smokeless tobacco: Never Used  Substance and Sexual Activity  . Alcohol use: No  . Drug use: No  . Sexual activity: Not on file  Other Topics Concern  . Not on file  Social History Narrative   06/14/19 living at [facility] Strain:   . Difficulty of Paying Living Expenses:   Food Insecurity:   . Worried About Charity fundraiser in the Last Year:   . Arboriculturist in the Last Year:   Transportation Needs:   . Film/video editor (Medical):   Marland Kitchen Lack of Transportation (Non-Medical):   Physical Activity:   . Days of Exercise per Week:   . Minutes of Exercise per Session:   Stress:   . Feeling of Stress :   Social Connections:   . Frequency of Communication with Friends and Family:   . Frequency of Social Gatherings with Friends and Family:   . Attends Religious Services:   . Active Member of Clubs or Organizations:   . Attends Archivist Meetings:   Marland Kitchen Marital Status:   Intimate Partner Violence:   . Fear of Current or Ex-Partner:   . Emotionally Abused:   Marland Kitchen Physically Abused:   . Sexually Abused:     Family History:  Family History  Problem Relation Age of Onset  . Heart disease Other   . Heart attack Brother        x 3  . Heart failure Mother        chf    Medications:   Current Outpatient Medications on File Prior to Visit  Medication Sig Dispense Refill  . apixaban (ELIQUIS) 5 MG TABS tablet Take 1 tablet (5 mg total) by mouth 2 (two) times daily. 60 tablet   . aspirin EC 81 MG tablet Take 1 tablet (81 mg total) by mouth daily.    Marland Kitchen atorvastatin (LIPITOR) 10 MG tablet Take 1 tablet (10 mg total) by mouth daily. 90 tablet 3  . diltiazem (CARDIZEM CD) 120 MG 24 hr capsule Take 1 capsule (120 mg total) by mouth daily. 30 capsule 11  . potassium chloride SA (K-DUR,KLOR-CON) 20 MEQ tablet Take 1 tablet (20 mEq total) by mouth daily. 90 tablet 3   No current facility-administered medications on file prior to visit.    Allergies:   Allergies  Allergen Reactions  . Loratadine Other (See Comments)    Nose bleed     Physical Exam  Vitals:   06/14/19 1249  BP: 130/72  Pulse: 73  Temp: (!) 97.4 F (36.3 C)  Weight: 145 lb  (65.8 kg)  Height: 5' 4.5" (1.638 m)   Body mass index is 24.5 kg/m. No exam data present  General: well developed, well nourished, very pleasant elderly Caucasian female, seated, in no evident distress Head: head normocephalic and atraumatic.   Neck: supple with no carotid or supraclavicular bruits Cardiovascular: regular rate and rhythm, no murmurs Musculoskeletal: no deformity Skin:  no rash/petichiae Vascular:  Normal pulses all extremities   Neurologic Exam Mental Status: Awake and fully alert.   Normal speech and language.  Oriented to place and time. Recent and  remote memory diminished. Attention span, concentration and fund of knowledge diminished. Mood and affect appropriate.  Cranial Nerves: Fundoscopic exam reveals sharp disc margins. Pupils equal, briskly reactive to light. Extraocular movements full without nystagmus. Visual fields full to confrontation. Hearing intact. Facial sensation intact. Face, tongue, palate moves normally and symmetrically.  Motor: Normal bulk and tone. Normal strength in all tested extremity muscles except mild left hip flexor weakness. Sensory.: intact to touch , pinprick , position and vibratory sensation.  Coordination: Rapid alternating movements normal in all extremities. Finger-to-nose and heel-to-shin performed accurately bilaterally. Gait and Station: Arises from chair without difficulty. Stance is normal. Gait demonstrates normal stride length and balance with use of rolling walker Reflexes: 1+ and symmetric. Toes downgoing.     NIHSS  0 Modified Rankin  2    ASSESSMENT: Melinda Edwards is a 86 y.o. year old female presented on 05/10/2019 with right-sided weakness and on steady gait post fall 5 days prior to admission with stroke work-up revealing left BG/CR infarct secondary to small vessel disease.  Prior to discharge, found to be in new onset atrial fibrillation with RVR therefore Eliquis initiated in addition to aspirin for secondary  stroke prevention.  Vascular risk factors include new onset atrial fibrillation, HTN, HLD, diffuse intercranial stenosis and baseline dementia.  Residual deficits of mild RUE weakness but overall greatly improving.    PLAN:  1. Left BG/CR stroke: Continue aspirin 81 mg daily and Eliquis (apixaban) daily  and atorvastatin 10 mg daily for secondary stroke prevention. Maintain strict control of hypertension with blood pressure goal below 130/90, diabetes with hemoglobin A1c goal below 6.5% and cholesterol with LDL cholesterol (bad cholesterol) goal below 70 mg/dL.  I also advised the patient to eat a healthy diet with plenty of whole grains, cereals, fruits and vegetables, exercise regularly with at least 30 minutes of continuous activity daily and maintain ideal body weight. 2. Atrial fibrillation: New onset diagnosed during admission.  Continuation of Eliquis and ongoing follow-up/management with cardiology 3. HTN: Stable.  Continue to follow with PCP for ongoing monitoring management 4. HLD: Continuation of atorvastatin.  Request ongoing monitoring and management along with repeat lipid panel with PCP  5. Intracranial stenosis: Diffuse throughout currently asymptomatic.  Advised continuation of aspirin and statin and importance of aggressive stroke risk factor modification.  Currently asymptomatic therefore no further interventions or evaluations warranted    Follow up in 3 months or call earlier if needed   I spent 45 minutes of face-to-face and non-face-to-face time with patient.  This included previsit chart review, lab review, study review, order entry, electronic health record documentation, patient education regarding recent stroke, importance of stroke risk factor management and answered all questions to patient and POA's satisfaction    Frann Rider, [facility]  [facility] [address] [place], [place] [address]  Phone [phone] Fax  [phone] Note: This document was prepared with digital dictation and possible smart phrase technology. Any transcriptional errors that result from this process are unintentional.

## 2019-06-14 NOTE — Patient Instructions (Addendum)
Continue aspirin 81 mg daily and Eliquis (apixaban) daily  and atorvastatin for secondary stroke prevention  Continue to follow up with PCP regarding cholesterol and blood pressure management   Continue to work with therapies for ongoing improvement of right leg weakness  Continue to monitor blood pressure at home  Maintain strict control of hypertension with blood pressure goal below 130/90, diabetes with hemoglobin A1c goal below 6.5% and cholesterol with LDL cholesterol (bad cholesterol) goal below 70 mg/dL. I also advised the patient to eat a healthy diet with plenty of whole grains, cereals, fruits and vegetables, exercise regularly and maintain ideal body weight.  Followup in the future with me in 3 months or call earlier if needed       Thank you for coming to see [place] at [facility]. I hope we have been able to provide you high quality care today.  You may receive a patient satisfaction survey over the next few weeks. We would appreciate your feedback and comments so that we may continue to improve ourselves and the health of our patients.

## 2019-06-15 NOTE — Progress Notes (Signed)
I agree with the above plan 

## 2019-06-18 DIAGNOSIS — R278 Other lack of coordination: Secondary | ICD-10-CM | POA: Diagnosis not present

## 2019-06-18 DIAGNOSIS — F0390 Unspecified dementia without behavioral disturbance: Secondary | ICD-10-CM | POA: Diagnosis not present

## 2019-06-20 DIAGNOSIS — F0390 Unspecified dementia without behavioral disturbance: Secondary | ICD-10-CM | POA: Diagnosis not present

## 2019-06-20 DIAGNOSIS — R278 Other lack of coordination: Secondary | ICD-10-CM | POA: Diagnosis not present

## 2019-06-25 DIAGNOSIS — F0390 Unspecified dementia without behavioral disturbance: Secondary | ICD-10-CM | POA: Diagnosis not present

## 2019-06-25 DIAGNOSIS — R278 Other lack of coordination: Secondary | ICD-10-CM | POA: Diagnosis not present

## 2019-06-26 DIAGNOSIS — D519 Vitamin B12 deficiency anemia, unspecified: Secondary | ICD-10-CM | POA: Diagnosis not present

## 2019-06-26 DIAGNOSIS — E039 Hypothyroidism, unspecified: Secondary | ICD-10-CM | POA: Diagnosis not present

## 2019-06-26 DIAGNOSIS — G309 Alzheimer's disease, unspecified: Secondary | ICD-10-CM | POA: Diagnosis not present

## 2019-06-27 DIAGNOSIS — R278 Other lack of coordination: Secondary | ICD-10-CM | POA: Diagnosis not present

## 2019-06-27 DIAGNOSIS — F0390 Unspecified dementia without behavioral disturbance: Secondary | ICD-10-CM | POA: Diagnosis not present

## 2019-07-02 DIAGNOSIS — F0390 Unspecified dementia without behavioral disturbance: Secondary | ICD-10-CM | POA: Diagnosis not present

## 2019-07-02 DIAGNOSIS — R278 Other lack of coordination: Secondary | ICD-10-CM | POA: Diagnosis not present

## 2019-07-04 DIAGNOSIS — F0390 Unspecified dementia without behavioral disturbance: Secondary | ICD-10-CM | POA: Diagnosis not present

## 2019-07-04 DIAGNOSIS — R278 Other lack of coordination: Secondary | ICD-10-CM | POA: Diagnosis not present

## 2019-07-09 DIAGNOSIS — R278 Other lack of coordination: Secondary | ICD-10-CM | POA: Diagnosis not present

## 2019-07-09 DIAGNOSIS — F0390 Unspecified dementia without behavioral disturbance: Secondary | ICD-10-CM | POA: Diagnosis not present

## 2019-07-10 ENCOUNTER — Other Ambulatory Visit: Payer: Self-pay | Admitting: Geriatric Medicine

## 2019-07-10 DIAGNOSIS — Z1231 Encounter for screening mammogram for malignant neoplasm of breast: Secondary | ICD-10-CM

## 2019-07-11 DIAGNOSIS — R278 Other lack of coordination: Secondary | ICD-10-CM | POA: Diagnosis not present

## 2019-07-11 DIAGNOSIS — F0390 Unspecified dementia without behavioral disturbance: Secondary | ICD-10-CM | POA: Diagnosis not present

## 2019-07-17 DIAGNOSIS — F0390 Unspecified dementia without behavioral disturbance: Secondary | ICD-10-CM | POA: Diagnosis not present

## 2019-07-17 DIAGNOSIS — R278 Other lack of coordination: Secondary | ICD-10-CM | POA: Diagnosis not present

## 2019-08-17 ENCOUNTER — Ambulatory Visit
Admission: RE | Admit: 2019-08-17 | Discharge: 2019-08-17 | Disposition: A | Discharge status: Home / Self Care | Payer: Medicare PPO | Source: Ambulatory Visit | Attending: Geriatric Medicine | Admitting: Geriatric Medicine

## 2019-08-17 ENCOUNTER — Other Ambulatory Visit: Payer: Self-pay

## 2019-08-17 DIAGNOSIS — Z1231 Encounter for screening mammogram for malignant neoplasm of breast: Secondary | ICD-10-CM

## 2019-08-24 DIAGNOSIS — H02401 Unspecified ptosis of right eyelid: Secondary | ICD-10-CM | POA: Diagnosis not present

## 2019-08-24 DIAGNOSIS — H40011 Open angle with borderline findings, low risk, right eye: Secondary | ICD-10-CM | POA: Diagnosis not present

## 2019-08-27 DIAGNOSIS — Z1389 Encounter for screening for other disorder: Secondary | ICD-10-CM | POA: Diagnosis not present

## 2019-08-27 DIAGNOSIS — E7800 Pure hypercholesterolemia, unspecified: Secondary | ICD-10-CM | POA: Diagnosis not present

## 2019-08-27 DIAGNOSIS — G301 Alzheimer's disease with late onset: Secondary | ICD-10-CM | POA: Diagnosis not present

## 2019-08-27 DIAGNOSIS — F0280 Dementia in other diseases classified elsewhere without behavioral disturbance: Secondary | ICD-10-CM | POA: Diagnosis not present

## 2019-08-27 DIAGNOSIS — J8410 Pulmonary fibrosis, unspecified: Secondary | ICD-10-CM | POA: Diagnosis not present

## 2019-08-27 DIAGNOSIS — Z79899 Other long term (current) drug therapy: Secondary | ICD-10-CM | POA: Diagnosis not present

## 2019-08-27 DIAGNOSIS — Z0000 Encounter for general adult medical examination without abnormal findings: Secondary | ICD-10-CM | POA: Diagnosis not present

## 2019-09-13 DIAGNOSIS — T148XXA Other injury of unspecified body region, initial encounter: Secondary | ICD-10-CM | POA: Diagnosis not present

## 2019-09-13 DIAGNOSIS — I480 Paroxysmal atrial fibrillation: Secondary | ICD-10-CM | POA: Diagnosis not present

## 2019-09-14 DIAGNOSIS — R0989 Other specified symptoms and signs involving the circulatory and respiratory systems: Secondary | ICD-10-CM | POA: Diagnosis not present

## 2019-09-20 DIAGNOSIS — J398 Other specified diseases of upper respiratory tract: Secondary | ICD-10-CM | POA: Diagnosis not present

## 2019-09-26 ENCOUNTER — Encounter: Payer: Self-pay | Admitting: Adult Health

## 2019-09-26 ENCOUNTER — Other Ambulatory Visit: Payer: Self-pay

## 2019-09-26 ENCOUNTER — Ambulatory Visit: Payer: Medicare PPO | Admitting: Adult Health

## 2019-09-26 VITALS — BP 129/77 | HR 80 | Ht 64.5 in | Wt 140.0 lb

## 2019-09-26 DIAGNOSIS — I480 Paroxysmal atrial fibrillation: Secondary | ICD-10-CM

## 2019-09-26 DIAGNOSIS — F0280 Dementia in other diseases classified elsewhere without behavioral disturbance: Secondary | ICD-10-CM

## 2019-09-26 DIAGNOSIS — E785 Hyperlipidemia, unspecified: Secondary | ICD-10-CM

## 2019-09-26 DIAGNOSIS — I10 Essential (primary) hypertension: Secondary | ICD-10-CM | POA: Diagnosis not present

## 2019-09-26 DIAGNOSIS — F0150 Vascular dementia without behavioral disturbance: Secondary | ICD-10-CM | POA: Diagnosis not present

## 2019-09-26 DIAGNOSIS — I639 Cerebral infarction, unspecified: Secondary | ICD-10-CM | POA: Diagnosis not present

## 2019-09-26 DIAGNOSIS — I6381 Other cerebral infarction due to occlusion or stenosis of small artery: Secondary | ICD-10-CM

## 2019-09-26 DIAGNOSIS — G309 Alzheimer's disease, unspecified: Secondary | ICD-10-CM

## 2019-09-26 NOTE — Patient Instructions (Signed)
Her memory testing today showed MMSE at 10/30 which indicates severe dementia.  We will check lab work to look for reversible causes and will call tomorrow with results.  Continue aspirin 81 mg daily and Eliquis (apixaban) daily  and atorvastatin for secondary stroke prevention  Continue to follow up with PCP regarding cholesterol and blood pressure management   Continue to monitor blood pressure at home  Maintain strict control of hypertension with blood pressure goal below 130/90, diabetes with hemoglobin A1c goal below 6.5% and cholesterol with LDL cholesterol (bad cholesterol) goal below 70 mg/dL. I also advised the patient to eat a healthy diet with plenty of whole grains, cereals, fruits and vegetables, exercise regularly and maintain ideal body weight.  Followup in the future with me in 6 months or call earlier if needed       Thank you for coming to see [place] at [facility]. I hope we have been able to provide you high quality care today.  You may receive a patient satisfaction survey over the next few weeks. We would appreciate your feedback and comments so that we may continue to improve ourselves and the health of our patients.

## 2019-09-26 NOTE — Progress Notes (Signed)
[facility] [address] [place]. [place] [address] [phone]       STROKE FOLLOW UP NOTE  Ms. Melinda Edwards Date of Birth:  [birth year] Medical Record Number:  [id number]   Reason for Referral: stroke follow up    CHIEF COMPLAINT:  Chief Complaint  Patient presents with  . Follow-up    TM room, with friend POA, difficulty walking     HPI:   Today, 09/26/2019, Ms. Melinda Edwards.  Returns for stroke follow-up accompanied by her friend/POA.  She has been stable from a stroke standpoint since prior visit but friend does report gradual decline of cognition and gait.  Her short-term memory has been declining and is less active.  Known baseline dementia prior to stroke previously stable without worsening.  No evidence of depression or associated behaviors.  MMSE today 10/30.  Continues to reside at [facility] [place] where she receives personal caregiver assistance 10hrs per day 7 days per week.  Use of RW for ambulation with friend reporting slower walking pace.  No recent falls.  Remains on aspirin and Eliquis without bleeding or bruising.  Continues on atorvastatin without myalgias.  Blood pressure today 129/77.  Recent lab work by PCP which was stable per friend but unable to personally view.  No further concerns at this time.    History provided for reference purposes only Initial visit 06/14/2019 JM: Melinda Edwards is a 86 year old female who is being seen today, 06/14/2019, for hospital follow-up accompanied by her POA. Has been receiving PT at [facility] [place]. She does have home sitters staying from 8am-8pm for any needed assistance and safety.Residual deficits of right sided weakness but overall improvement.  She continues to work with therapies at [facility].  Ongoing use of rolling walker and denies any recent falls.  Baseline dementia has been stable.  Continues on aspirin and Eliquis without bleeding or bruising.  Continues on atorvastatin 10 mg daily without  myalgias.  Blood pressure today 130/72.  Denies new or worsening stroke/TIA symptoms.  Stroke admission 05/10/2019: Ms. LETTI Edwards is a 86 y.o. female with history of HTN, HLD  presented on 05/10/2019 with R sided weakness and unsteady gait following a fall on Saturday (05/05/2019) at ALF.  Evaluated by stroke team and Dr. Erlinda Hong with stroke work-up revealing left BG/CR infarct secondary to small vessel disease.  CTA head/neck showed asymptomatic diffuse intracranial arthrosclerosis and questionable supraclinoid left ICA aneurysm versus irregularity.  Initially recommended DAPT for 3 weeks and aspirin alone but was found in A. fib and RVR on telemetry during admission and initiating Eliquis for secondary stroke prevention.  HTN stable without medication management needed.  LDL 178 and recommended starting atorvastatin 10mg  daily.  No history of DM.  No prior history of stroke.  Baseline memory disorder with dementia stable through admission.  She was discharged to SNF for ongoing therapy needs.  Stroke:   L BG/CR infarct secondary to small vessel disease source  CT head No acute abnormality. Small vessel disease. Atrophy.   MRI  Small L BG/CR infarct. Small vessel disease.   CTA head & neck evolving L basal ganglia and adjacent white matter infarct.  Diffuse intracranial atherosclerosis with severe right MCA, right PCA severe stenosis, left MCA mild to moderate stenosis. ? Supraclinoid L ICA aneurysm vs irregularity  2D Echo EF 60-65%. No source of embolus   LDL 178 -increase atorvastatin to 40 mg daily  HgbA1c 5.6  Lovenox 40 mg sq daily for VTE  prophylaxis  No antithrombotic prior to admission, new onset A. fib with RVR and recommended initiating Eliquis  Therapy recommendations:  Discharge to SNF for therapy needs       ROS:   14 system review of systems performed and negative with exception of weakness, gait impairment and memory loss  PMH:  Past Medical History:  Diagnosis Date   . Hyperlipidemia   . Hypertension   . PAC (premature atrial contraction)    BENIGH  . Palpitations   . Pulmonary nodule   . Stroke [facility]) 05/10/2019    PSH:  Past Surgical History:  Procedure Laterality Date  . BREAST EXCISIONAL BIOPSY Right 2003  . DILATION AND CURETTAGE OF UTERUS      Social History:  Social History   Socioeconomic History  . Marital status: Single    Spouse name: Not on file  . Number of children: 0  . Years of education: Not on file  . Highest education level: Not on file  Occupational History  . Occupation: Retired  Tobacco Use  . Smoking status: Former Smoker    Packs/day: 1.00    Types: Cigarettes    Quit date: 08/12/1995    Years since quitting: 24.1  . Smokeless tobacco: Never Used  Substance and Sexual Activity  . Alcohol use: No  . Drug use: No  . Sexual activity: Not on file  Other Topics Concern  . Not on file  Social History Narrative   06/14/19 living at [place] Strain:   . Difficulty of Paying Living Expenses:   Food Insecurity:   . Worried About Charity fundraiser in the Last Year:   . Arboriculturist in the Last Year:   Transportation Needs:   . Film/video editor (Medical):   Marland Kitchen Lack of Transportation (Non-Medical):   Physical Activity:   . Days of Exercise per Week:   . Minutes of Exercise per Session:   Stress:   . Feeling of Stress :   Social Connections:   . Frequency of Communication with Friends and Family:   . Frequency of Social Gatherings with Friends and Family:   . Attends Religious Services:   . Active Member of Clubs or Organizations:   . Attends Archivist Meetings:   Marland Kitchen Marital Status:   Intimate Partner Violence:   . Fear of Current or Ex-Partner:   . Emotionally Abused:   Marland Kitchen Physically Abused:   . Sexually Abused:     Family History:  Family History  Problem Relation Age of Onset  . Heart disease Other   . Heart attack  Brother        x 3  . Heart failure Mother        chf    Medications:   Current Outpatient Medications on File Prior to Visit  Medication Sig Dispense Refill  . apixaban (ELIQUIS) 5 MG TABS tablet Take 1 tablet (5 mg total) by mouth 2 (two) times daily. 60 tablet   . aspirin EC 81 MG tablet Take 1 tablet (81 mg total) by mouth daily.    Marland Kitchen atorvastatin (LIPITOR) 10 MG tablet Take 1 tablet (10 mg total) by mouth daily. 90 tablet 3  . diltiazem (CARDIZEM CD) 120 MG 24 hr capsule Take 1 capsule (120 mg total) by mouth daily. 30 capsule 11  . potassium chloride SA (K-DUR,KLOR-CON) 20 MEQ tablet Take 1 tablet (20 mEq total) by mouth  daily. 90 tablet 3   No current facility-administered medications on file prior to visit.    Allergies:   Allergies  Allergen Reactions  . Loratadine Other (See Comments)    Nose bleed     Physical Exam  Vitals:   09/26/19 1342  BP: 129/77  Pulse: 80  Weight: 140 lb (63.5 kg)  Height: 5' 4.5" (1.638 m)   Body mass index is 23.66 kg/m. No exam data present  General: Frail very pleasant elderly Caucasian female, seated, in no evident distress Head: head normocephalic and atraumatic.   Neck: supple with no carotid or supraclavicular bruits Cardiovascular: regular rate and rhythm, no murmurs Musculoskeletal: no deformity Skin:  no rash/petichiae Vascular:  Normal pulses all extremities   Neurologic Exam Mental Status: Awake and fully alert.  Normal speech and language.  Disoriented to place and time.  Recent and remote memory diminished. Attention span, concentration and fund of knowledge diminished. Mood and affect appropriate and very pleasant.  MMSE - Mini Mental State Exam 09/26/2019  Orientation to time 0  Orientation to Place 0  Registration 3  Attention/ Calculation 1  Recall 0  Language- name 2 objects 2  Language- repeat 1  Language- follow 3 step command 2  Language- read & follow direction 0  Write a sentence 1  Copy design 0   Copy design-comments no animals  Total score 10   Cranial Nerves: Pupils equal, briskly reactive to light. Extraocular movements full without nystagmus. Visual fields full to confrontation. Hearing intact. Facial sensation intact. Face, tongue, palate moves normally and symmetrically.  Motor: Normal bulk and tone. Normal strength in all tested extremity muscles Sensory.: intact to touch , pinprick , position and vibratory sensation.  Coordination: Rapid alternating movements normal in all extremities. Finger-to-nose and heel-to-shin performed accurately bilaterally. Gait and Station: Arises from chair without difficulty. Stance is slightly hunched. Gait demonstrates  slow cautious steps with use of Rollator walker Reflexes: 1+ and symmetric. Toes downgoing.       ASSESSMENT: Melinda Edwards is a 86 y.o. year old female presented on 05/10/2019 with right-sided weakness and on steady gait post fall 5 days prior to admission with stroke work-up revealing left BG/CR infarct secondary to small vessel disease.  Prior to discharge, found to be in new onset atrial fibrillation with RVR therefore Eliquis initiated in addition to aspirin for secondary stroke prevention.  Vascular risk factors include new onset atrial fibrillation, HTN, HLD, diffuse intercranial stenosis and baseline dementia.     PLAN:  1. 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 Per friend, more rapid decline over the past few months.  MMSE 10/30 with no prior testing to compare to.  Will obtain dementia panel to assess for reversible causes.  We will hold off on EEG or MRI as she will likely not tolerate.  Declined interest in medication such as Aricept or Namenda.  No behavioral concerns 2. Left BG/CR stroke: -Continue aspirin 81 mg daily and Eliquis (apixaban) daily  and atorvastatin 10 mg daily for secondary stroke prevention. Maintain strict control of  hypertension with blood pressure goal below 130/90, diabetes with hemoglobin A1c goal below 6.5% and cholesterol with LDL cholesterol (bad cholesterol) goal below 70 mg/dL.  I also advised the patient to eat a healthy diet with plenty of whole grains, cereals, fruits and vegetables, exercise regularly with at least 30 minutes of continuous activity daily and maintain ideal body weight. 3. Atrial fibrillation:  Continuation of Eliquis and ongoing follow-up/management with cardiology 4. HTN: BP goal<130/90.  Stable today.  Monitoring and management by PCP 5. HLD: LDL goal <70.  Recent lipid panel obtained by PCP.  Continuation of atorvastatin and ongoing follow-up with PCP for prescribing, monitoring and management    Follow up in 6 months or call earlier if needed   I spent 35 minutes of face-to-face and non-face-to-face time with patient and friend.  This included previsit chart review, lab review, study review, order entry, electronic health record documentation, patient education regarding cognitive impairment gradual worsening, history of prior stroke, importance of managing stroke risk factors and answered all questions to patient and friend satisfaction   Frann Rider, AGNP-BC  [facility] [address] [place], [place] [address]  Phone [phone] Fax [phone] Note: This document was prepared with digital dictation and possible smart phrase technology. Any transcriptional errors that result from this process are unintentional.

## 2019-09-27 LAB — DEMENTIA PANEL
Homocysteine: 15.8 umol/L (ref 0.0–21.3)
RPR Ser Ql: NONREACTIVE
TSH: 3.18 u[IU]/mL (ref 0.450–4.500)
Vitamin B-12: 599 pg/mL (ref 232–1245)

## 2019-09-27 NOTE — Progress Notes (Signed)
I agree with the above plan 

## 2019-10-01 ENCOUNTER — Telehealth: Payer: Self-pay

## 2019-10-01 NOTE — Telephone Encounter (Signed)
Pt's friend [name] was notified of the message below.

## 2019-10-01 NOTE — Telephone Encounter (Signed)
-----   Message from Frann Rider, NP sent at 09/27/2019  4:59 PM EDT ----- Please advise patient/friend that recent lab work satisfactory

## 2019-11-27 DIAGNOSIS — Z20828 Contact with and (suspected) exposure to other viral communicable diseases: Secondary | ICD-10-CM | POA: Diagnosis not present

## 2019-11-27 DIAGNOSIS — Z1383 Encounter for screening for respiratory disorder NEC: Secondary | ICD-10-CM | POA: Diagnosis not present

## 2019-12-22 DIAGNOSIS — Z23 Encounter for immunization: Secondary | ICD-10-CM | POA: Diagnosis not present

## 2020-01-09 DIAGNOSIS — G301 Alzheimer's disease with late onset: Secondary | ICD-10-CM | POA: Diagnosis not present

## 2020-01-09 DIAGNOSIS — J8410 Pulmonary fibrosis, unspecified: Secondary | ICD-10-CM | POA: Diagnosis not present

## 2020-01-09 DIAGNOSIS — I6389 Other cerebral infarction: Secondary | ICD-10-CM | POA: Diagnosis not present

## 2020-01-09 DIAGNOSIS — T148XXA Other injury of unspecified body region, initial encounter: Secondary | ICD-10-CM | POA: Diagnosis not present

## 2020-01-09 DIAGNOSIS — I480 Paroxysmal atrial fibrillation: Secondary | ICD-10-CM | POA: Diagnosis not present

## 2020-01-10 DIAGNOSIS — I10 Essential (primary) hypertension: Secondary | ICD-10-CM | POA: Diagnosis not present

## 2020-02-18 ENCOUNTER — Emergency Department (HOSPITAL_COMMUNITY): Payer: Medicare PPO

## 2020-02-18 ENCOUNTER — Other Ambulatory Visit: Payer: Self-pay

## 2020-02-18 ENCOUNTER — Emergency Department (HOSPITAL_COMMUNITY)
Admission: EM | Admit: 2020-02-18 | Discharge: 2020-02-18 | Disposition: A | Discharge status: Home / Self Care | Payer: Medicare PPO | Attending: Emergency Medicine | Admitting: Emergency Medicine

## 2020-02-18 ENCOUNTER — Encounter (HOSPITAL_COMMUNITY): Payer: Self-pay | Admitting: *Deleted

## 2020-02-18 ENCOUNTER — Encounter: Payer: Self-pay | Admitting: Adult Health

## 2020-02-18 DIAGNOSIS — Z79899 Other long term (current) drug therapy: Secondary | ICD-10-CM | POA: Insufficient documentation

## 2020-02-18 DIAGNOSIS — F0390 Unspecified dementia without behavioral disturbance: Secondary | ICD-10-CM

## 2020-02-18 DIAGNOSIS — Z87891 Personal history of nicotine dependence: Secondary | ICD-10-CM | POA: Insufficient documentation

## 2020-02-18 DIAGNOSIS — R5381 Other malaise: Secondary | ICD-10-CM | POA: Diagnosis not present

## 2020-02-18 DIAGNOSIS — W1839XA Other fall on same level, initial encounter: Secondary | ICD-10-CM | POA: Diagnosis not present

## 2020-02-18 DIAGNOSIS — M25559 Pain in unspecified hip: Secondary | ICD-10-CM

## 2020-02-18 DIAGNOSIS — I10 Essential (primary) hypertension: Secondary | ICD-10-CM | POA: Insufficient documentation

## 2020-02-18 DIAGNOSIS — Z043 Encounter for examination and observation following other accident: Secondary | ICD-10-CM | POA: Diagnosis not present

## 2020-02-18 DIAGNOSIS — Z7982 Long term (current) use of aspirin: Secondary | ICD-10-CM | POA: Diagnosis not present

## 2020-02-18 DIAGNOSIS — W19XXXA Unspecified fall, initial encounter: Secondary | ICD-10-CM

## 2020-02-18 DIAGNOSIS — R404 Transient alteration of awareness: Secondary | ICD-10-CM | POA: Diagnosis not present

## 2020-02-18 DIAGNOSIS — S0990XA Unspecified injury of head, initial encounter: Secondary | ICD-10-CM | POA: Diagnosis not present

## 2020-02-18 HISTORY — DX: Pulmonary fibrosis, unspecified: J84.10

## 2020-02-18 HISTORY — DX: Dementia in other diseases classified elsewhere, unspecified severity, without behavioral disturbance, psychotic disturbance, mood disturbance, and anxiety: F02.80

## 2020-02-18 LAB — CBC WITH DIFFERENTIAL/PLATELET
Abs Immature Granulocytes: 0.12 10*3/uL — ABNORMAL HIGH (ref 0.00–0.07)
Basophils Absolute: 0.1 10*3/uL (ref 0.0–0.1)
Basophils Relative: 1 %
Eosinophils Absolute: 0.1 10*3/uL (ref 0.0–0.5)
Eosinophils Relative: 1 %
HCT: 44.3 % (ref 36.0–46.0)
Hemoglobin: 13.8 g/dL (ref 12.0–15.0)
Immature Granulocytes: 1 %
Lymphocytes Relative: 17 %
Lymphs Abs: 1.5 10*3/uL (ref 0.7–4.0)
MCH: 27.3 pg (ref 26.0–34.0)
MCHC: 31.2 g/dL (ref 30.0–36.0)
MCV: 87.5 fL (ref 80.0–100.0)
Monocytes Absolute: 0.4 10*3/uL (ref 0.1–1.0)
Monocytes Relative: 5 %
Neutro Abs: 6.5 10*3/uL (ref 1.7–7.7)
Neutrophils Relative %: 75 %
Platelets: 248 10*3/uL (ref 150–400)
RBC: 5.06 MIL/uL (ref 3.87–5.11)
RDW: 13.9 % (ref 11.5–15.5)
WBC: 8.6 10*3/uL (ref 4.0–10.5)
nRBC: 0 % (ref 0.0–0.2)

## 2020-02-18 LAB — BASIC METABOLIC PANEL
Anion gap: 11 (ref 5–15)
BUN: 23 mg/dL (ref 8–23)
CO2: 23 mmol/L (ref 22–32)
Calcium: 9.1 mg/dL (ref 8.9–10.3)
Chloride: 106 mmol/L (ref 98–111)
Creatinine, Ser: 0.68 mg/dL (ref 0.44–1.00)
GFR, Estimated: >60 mL/min (ref >60)
Glucose, Bld: 115 mg/dL — ABNORMAL HIGH (ref 70–99)
Potassium: 4.1 mmol/L (ref 3.5–5.1)
Sodium: 140 mmol/L (ref 135–145)

## 2020-02-18 NOTE — ED Provider Notes (Signed)
[facility] EMERGENCY DEPARTMENT Provider Note   CSN: [id number] Arrival date & time: 02/18/20  9935     History Chief Complaint  Patient presents with  . Fall    on blood thinners    Melinda Edwards is a 86 y.o. female.  Presents to ER with concern for fall.  Level 2 trauma.  Fall on thinners.  On Eliquis.  Patient had unwitnessed fall, found on ground.  EMS did not note any specific trauma.  History limited due to patient's dementia.  She is able to answer basic questions, denies any acute pain.  Additional history obtained from patient's friend, POA, patient acting appropriately, in her normal state of health currently.  Only concern was this fall.  Per review of notes from nursing facility, she has dementia and has durable DNR, per MOST form comfort measures.  HPI     Past Medical History:  Diagnosis Date  . Alzheimer's dementia ([place])   . Hyperlipidemia   . Hypertension   . PAC (premature atrial contraction)    BENIGH  . Palpitations   . Pulmonary fibrosis ([place])   . Pulmonary nodule   . Stroke ([place]) 05/10/2019  . Stroke [facility]) 06/16/2019    Patient Active Problem List   Diagnosis Date Noted  . Atrial fibrillation with RVR ([place]) 05/14/2019  . Stroke ([place]) 05/10/2019  . Essential hypertension 05/10/2019  . Memory disorder 10/03/2013  . Nocturnal leg cramps 08/08/2012  . Aortic stenosis, mild 04/13/2011  . Benign hypertensive heart disease without heart failure 08/13/2010  . Hyperlipidemia 08/13/2010  . Palpitations 08/13/2010  . Bronchiectasis without acute exacerbation ([place]) 11/15/2007      OB History   No obstetric history on file.     Family History  Problem Relation Age of Onset  . Heart disease Other   . Heart attack Brother        x 3  . Heart failure Mother        chf    Social History   Tobacco Use  . Smoking status: Former Smoker    Packs/day: 1.00    Types: Cigarettes    Quit date: 08/12/1995    Years since  quitting: 24.5  . Smokeless tobacco: Never Used  Substance Use Topics  . Alcohol use: No  . Drug use: No    Home Medications Prior to Admission medications   Medication Sig Start Date End Date Taking? Authorizing Provider  apixaban (ELIQUIS) 5 MG TABS tablet Take 1 tablet (5 mg total) by mouth 2 (two) times daily. 05/14/19   Charlynne Cousins, MD  aspirin EC 81 MG tablet Take 1 tablet (81 mg total) by mouth daily. 05/14/19   Charlynne Cousins, MD  atorvastatin (LIPITOR) 10 MG tablet Take 1 tablet (10 mg total) by mouth daily. 10/19/13   Darlin Coco, MD  diltiazem (CARDIZEM CD) 120 MG 24 hr capsule Take 1 capsule (120 mg total) by mouth daily. 05/14/19 05/13/20  Charlynne Cousins, MD  potassium chloride SA (K-DUR,KLOR-CON) 20 MEQ tablet Take 1 tablet (20 mEq total) by mouth daily. 10/19/13   Darlin Coco, MD    Allergies    Antihistamines, loratadine-type and Loratadine  Review of Systems   Review of Systems  Unable to perform ROS: Dementia    Physical Exam Updated Vital Signs BP 140/88   Pulse 70   Temp 98.4 F (36.9 C) (Oral)   Resp 18   SpO2 99%   Physical Exam Vitals and nursing  note reviewed.  Constitutional:      General: She is not in acute distress.    Appearance: She is well-developed.  HENT:     Head: Normocephalic and atraumatic.  Eyes:     Conjunctiva/sclera: Conjunctivae normal.  Cardiovascular:     Rate and Rhythm: Normal rate and regular rhythm.     Heart sounds: No murmur heard.   Pulmonary:     Effort: Pulmonary effort is normal. No respiratory distress.     Breath sounds: Normal breath sounds.  Abdominal:     Palpations: Abdomen is soft.     Tenderness: There is no abdominal tenderness.  Musculoskeletal:     Cervical back: Neck supple.     Comments: Back: no C, T, L spine TTP, no step off or deformity RUE: no TTP throughout, no deformity, normal joint ROM, radial pulse intact, distal sensation and motor intact LUE: no TTP throughout,  no deformity, normal joint ROM, radial pulse intact, distal sensation and motor intact RLE: mild TTP over hip, occasional scattered old bruise on lower leg but no deformity, normal joint ROM, distal pulse, sensation and motor intact LLE: no TTP throughout, occasional scattered old bruise but no deformity, normal joint ROM, distal pulse, sensation and motor intact  Skin:    General: Skin is warm and dry.  Neurological:     Mental Status: She is alert.     ED Results / Procedures / Treatments   Labs (all labs ordered are listed, but only abnormal results are displayed) Labs Reviewed  CBC WITH DIFFERENTIAL/PLATELET - Abnormal; Notable for the following components:      Result Value   Abs Immature Granulocytes 0.12 (*)    All other components within normal limits  BASIC METABOLIC PANEL - Abnormal; Notable for the following components:   Glucose, Bld 115 (*)    All other components within normal limits    EKG EKG Interpretation  Date/Time:  Monday February 18 2020 09:56:13 EST Ventricular Rate:  71 PR Interval:    QRS Duration: 106 QT Interval:  433 QTC Calculation: 471 R Axis:   1 Text Interpretation: Sinus rhythm RSR' in V1 or V2, right VCD or RVH Confirmed by Madalyn Rob [phone]) on 02/18/2020 9:58:14 AM Also confirmed by Madalyn Rob [phone]), editor Jeanine Luz [phone])  on 02/19/2020 7:02:44 AM   Radiology CT Head Wo Contrast  Result Date: 02/18/2020 CLINICAL DATA:  Head trauma.  On blood thinners. EXAM: CT HEAD WITHOUT CONTRAST TECHNIQUE: Contiguous axial images were obtained from the base of the skull through the vertex without intravenous contrast. COMPARISON:  May 10, 2019. FINDINGS: Brain: No evidence of acute infarction, hemorrhage, hydrocephalus, extra-axial collection or mass lesion/mass effect. Patchy white matter hypoattenuation, which is nonspecific but most likely related to chronic microvascular ischemic disease. Mild for age diffuse cerebral atrophy  with ex vacuo ventricular dilation. Vascular: Calcific atherosclerosis. Skull: No acute fracture. Sinuses/Orbits: Frothy secretions in a posterior left ethmoid air cell. Unremarkable orbits. Other: No mastoid effusions. IMPRESSION: 1. No evidence of acute intracranial abnormality. 2. Similar mild for age cerebral atrophy and chronic microvascular ischemic disease. Electronically Signed   By: Margaretha Sheffield MD   On: 02/18/2020 10:29   DG HIP UNILAT WITH PELVIS 2-3 VIEWS RIGHT  Result Date: 02/18/2020 CLINICAL DATA:  Fall. EXAM: DG HIP (WITH OR WITHOUT PELVIS) 2-3V RIGHT COMPARISON:  None. FINDINGS: There is no evidence of hip fracture or dislocation. There is no evidence of arthropathy or other focal bone abnormality. IMPRESSION: Negative.  Electronically Signed   By: Franchot Gallo M.D.   On: 02/18/2020 11:31    Procedures Procedures (including critical care time)  Medications Ordered in ED Medications - No data to display  ED Course  I have reviewed the triage vital signs and the nursing notes.  Pertinent labs & imaging results that were available during my care of the patient were reviewed by me and considered in my medical decision making (see chart for details).  Clinical Course as of Feb 19 1619  Mon Feb 18, 2020  1031 Attempted to call emergency contact - no answer   [RD]    Clinical Course User Index [RD] Lucrezia Starch, MD   MDM Rules/Calculators/A&P                         86 year old lady presented to ER due to concern for fall at her facility.  On exam, noted mild tenderness in her right hip but she otherwise was very well-appearing, at baseline.  CT head negative.  Plain films of right hip negative.  Basic labs within normal limits.  Believe she is stable and appropriate to go back to her nursing facility at this time. POA at bedside updated on plan.     After the discussed management above, the patient was determined to be safe for discharge.  The patient was in  agreement with this plan and all questions regarding their care were answered.  ED return precautions were discussed and the patient will return to the ED with any significant worsening of condition.  Final Clinical Impression(s) / ED Diagnoses Final diagnoses:  Fall, initial encounter  Dementia without behavioral disturbance, unspecified dementia type [facility])    Rx / DC Orders ED Discharge Orders    None       Lucrezia Starch, MD 02/19/20 1620

## 2020-02-18 NOTE — Discharge Instructions (Signed)
Return to ER for any worsening symptoms or new falls.  Follow-up with primary doctor.

## 2020-02-18 NOTE — ED Notes (Signed)
Pt's MOST and DNR forms found on desk.  [facility] @ [phone] and they stated they will come and pick it up.  I notified them I left the paperwork at the charge desk.

## 2020-02-20 DIAGNOSIS — I480 Paroxysmal atrial fibrillation: Secondary | ICD-10-CM | POA: Diagnosis not present

## 2020-02-20 DIAGNOSIS — R52 Pain, unspecified: Secondary | ICD-10-CM | POA: Diagnosis not present

## 2020-02-25 DIAGNOSIS — M546 Pain in thoracic spine: Secondary | ICD-10-CM | POA: Diagnosis not present

## 2020-02-25 DIAGNOSIS — M545 Low back pain, unspecified: Secondary | ICD-10-CM | POA: Diagnosis not present

## 2020-02-28 DIAGNOSIS — M5459 Other low back pain: Secondary | ICD-10-CM | POA: Diagnosis not present

## 2020-02-28 DIAGNOSIS — S32029A Unspecified fracture of second lumbar vertebra, initial encounter for closed fracture: Secondary | ICD-10-CM | POA: Diagnosis not present

## 2020-03-04 DIAGNOSIS — S32020A Wedge compression fracture of second lumbar vertebra, initial encounter for closed fracture: Secondary | ICD-10-CM | POA: Diagnosis not present

## 2020-03-16 ENCOUNTER — Inpatient Hospital Stay (HOSPITAL_COMMUNITY)
Admission: EM | Admit: 2020-03-16 | Discharge: 2020-03-21 | DRG: 393 | Disposition: A | Discharge status: Nursing Home (Includes ICF) | Payer: Medicare PPO | Source: Skilled Nursing Facility | Attending: Family Medicine | Admitting: Family Medicine

## 2020-03-16 ENCOUNTER — Observation Stay (HOSPITAL_COMMUNITY): Payer: Medicare PPO

## 2020-03-16 ENCOUNTER — Encounter (HOSPITAL_COMMUNITY): Payer: Self-pay

## 2020-03-16 ENCOUNTER — Other Ambulatory Visit: Payer: Self-pay

## 2020-03-16 ENCOUNTER — Emergency Department (HOSPITAL_COMMUNITY): Payer: Medicare PPO

## 2020-03-16 DIAGNOSIS — I480 Paroxysmal atrial fibrillation: Secondary | ICD-10-CM | POA: Diagnosis present

## 2020-03-16 DIAGNOSIS — Z8673 Personal history of transient ischemic attack (TIA), and cerebral infarction without residual deficits: Secondary | ICD-10-CM

## 2020-03-16 DIAGNOSIS — Z7901 Long term (current) use of anticoagulants: Secondary | ICD-10-CM | POA: Diagnosis not present

## 2020-03-16 DIAGNOSIS — G309 Alzheimer's disease, unspecified: Secondary | ICD-10-CM | POA: Diagnosis present

## 2020-03-16 DIAGNOSIS — R748 Abnormal levels of other serum enzymes: Secondary | ICD-10-CM | POA: Diagnosis present

## 2020-03-16 DIAGNOSIS — S32000D Wedge compression fracture of unspecified lumbar vertebra, subsequent encounter for fracture with routine healing: Secondary | ICD-10-CM

## 2020-03-16 DIAGNOSIS — S32029D Unspecified fracture of second lumbar vertebra, subsequent encounter for fracture with routine healing: Secondary | ICD-10-CM

## 2020-03-16 DIAGNOSIS — Z7982 Long term (current) use of aspirin: Secondary | ICD-10-CM

## 2020-03-16 DIAGNOSIS — R000 Tachycardia, unspecified: Secondary | ICD-10-CM | POA: Diagnosis present

## 2020-03-16 DIAGNOSIS — Z79899 Other long term (current) drug therapy: Secondary | ICD-10-CM

## 2020-03-16 DIAGNOSIS — Z66 Do not resuscitate: Secondary | ICD-10-CM | POA: Diagnosis present

## 2020-03-16 DIAGNOSIS — E785 Hyperlipidemia, unspecified: Secondary | ICD-10-CM | POA: Diagnosis present

## 2020-03-16 DIAGNOSIS — J984 Other disorders of lung: Secondary | ICD-10-CM | POA: Diagnosis not present

## 2020-03-16 DIAGNOSIS — E876 Hypokalemia: Secondary | ICD-10-CM | POA: Diagnosis present

## 2020-03-16 DIAGNOSIS — K5731 Diverticulosis of large intestine without perforation or abscess with bleeding: Secondary | ICD-10-CM | POA: Diagnosis present

## 2020-03-16 DIAGNOSIS — I119 Hypertensive heart disease without heart failure: Secondary | ICD-10-CM | POA: Diagnosis present

## 2020-03-16 DIAGNOSIS — W19XXXA Unspecified fall, initial encounter: Secondary | ICD-10-CM | POA: Diagnosis not present

## 2020-03-16 DIAGNOSIS — K5730 Diverticulosis of large intestine without perforation or abscess without bleeding: Secondary | ICD-10-CM | POA: Diagnosis not present

## 2020-03-16 DIAGNOSIS — F0280 Dementia in other diseases classified elsewhere without behavioral disturbance: Secondary | ICD-10-CM | POA: Diagnosis present

## 2020-03-16 DIAGNOSIS — Z20822 Contact with and (suspected) exposure to covid-19: Secondary | ICD-10-CM | POA: Diagnosis present

## 2020-03-16 DIAGNOSIS — Z888 Allergy status to other drugs, medicaments and biological substances status: Secondary | ICD-10-CM | POA: Diagnosis not present

## 2020-03-16 DIAGNOSIS — D62 Acute posthemorrhagic anemia: Secondary | ICD-10-CM | POA: Diagnosis present

## 2020-03-16 DIAGNOSIS — R58 Hemorrhage, not elsewhere classified: Secondary | ICD-10-CM | POA: Diagnosis not present

## 2020-03-16 DIAGNOSIS — Z8249 Family history of ischemic heart disease and other diseases of the circulatory system: Secondary | ICD-10-CM

## 2020-03-16 DIAGNOSIS — I10 Essential (primary) hypertension: Secondary | ICD-10-CM | POA: Diagnosis present

## 2020-03-16 DIAGNOSIS — Y92239 Unspecified place in hospital as the place of occurrence of the external cause: Secondary | ICD-10-CM | POA: Diagnosis not present

## 2020-03-16 DIAGNOSIS — K625 Hemorrhage of anus and rectum: Secondary | ICD-10-CM | POA: Diagnosis not present

## 2020-03-16 DIAGNOSIS — Z8719 Personal history of other diseases of the digestive system: Secondary | ICD-10-CM | POA: Diagnosis not present

## 2020-03-16 DIAGNOSIS — K649 Unspecified hemorrhoids: Principal | ICD-10-CM | POA: Diagnosis present

## 2020-03-16 DIAGNOSIS — K922 Gastrointestinal hemorrhage, unspecified: Secondary | ICD-10-CM | POA: Diagnosis present

## 2020-03-16 DIAGNOSIS — K5790 Diverticulosis of intestine, part unspecified, without perforation or abscess without bleeding: Secondary | ICD-10-CM | POA: Diagnosis not present

## 2020-03-16 DIAGNOSIS — D3502 Benign neoplasm of left adrenal gland: Secondary | ICD-10-CM | POA: Diagnosis present

## 2020-03-16 DIAGNOSIS — K449 Diaphragmatic hernia without obstruction or gangrene: Secondary | ICD-10-CM | POA: Diagnosis not present

## 2020-03-16 DIAGNOSIS — H9190 Unspecified hearing loss, unspecified ear: Secondary | ICD-10-CM | POA: Diagnosis present

## 2020-03-16 DIAGNOSIS — Z87891 Personal history of nicotine dependence: Secondary | ICD-10-CM

## 2020-03-16 DIAGNOSIS — N63 Unspecified lump in unspecified breast: Secondary | ICD-10-CM | POA: Diagnosis present

## 2020-03-16 DIAGNOSIS — W19XXXD Unspecified fall, subsequent encounter: Secondary | ICD-10-CM | POA: Diagnosis present

## 2020-03-16 DIAGNOSIS — J479 Bronchiectasis, uncomplicated: Secondary | ICD-10-CM | POA: Diagnosis present

## 2020-03-16 DIAGNOSIS — N858 Other specified noninflammatory disorders of uterus: Secondary | ICD-10-CM | POA: Diagnosis not present

## 2020-03-16 DIAGNOSIS — R710 Precipitous drop in hematocrit: Secondary | ICD-10-CM | POA: Diagnosis not present

## 2020-03-16 LAB — CBC WITH DIFFERENTIAL/PLATELET
Abs Immature Granulocytes: 0.03 10*3/uL (ref 0.00–0.07)
Basophils Absolute: 0.1 10*3/uL (ref 0.0–0.1)
Basophils Relative: 1 %
Eosinophils Absolute: 0.1 10*3/uL (ref 0.0–0.5)
Eosinophils Relative: 2 %
HCT: 39.6 % (ref 36.0–46.0)
Hemoglobin: 12.1 g/dL (ref 12.0–15.0)
Immature Granulocytes: 0 %
Lymphocytes Relative: 28 %
Lymphs Abs: 2 10*3/uL (ref 0.7–4.0)
MCH: 27.4 pg (ref 26.0–34.0)
MCHC: 30.6 g/dL (ref 30.0–36.0)
MCV: 89.6 fL (ref 80.0–100.0)
Monocytes Absolute: 0.6 10*3/uL (ref 0.1–1.0)
Monocytes Relative: 9 %
Neutro Abs: 4.2 10*3/uL (ref 1.7–7.7)
Neutrophils Relative %: 60 %
Platelets: 256 10*3/uL (ref 150–400)
RBC: 4.42 MIL/uL (ref 3.87–5.11)
RDW: 14.6 % (ref 11.5–15.5)
WBC: 6.9 10*3/uL (ref 4.0–10.5)
nRBC: 0 % (ref 0.0–0.2)

## 2020-03-16 LAB — CBC
HCT: 30.4 % — ABNORMAL LOW (ref 36.0–46.0)
Hemoglobin: 9.3 g/dL — ABNORMAL LOW (ref 12.0–15.0)
MCH: 27.4 pg (ref 26.0–34.0)
MCHC: 30.6 g/dL (ref 30.0–36.0)
MCV: 89.7 fL (ref 80.0–100.0)
Platelets: 232 10*3/uL (ref 150–400)
RBC: 3.39 MIL/uL — ABNORMAL LOW (ref 3.87–5.11)
RDW: 14.6 % (ref 11.5–15.5)
WBC: 9.2 10*3/uL (ref 4.0–10.5)
nRBC: 0 % (ref 0.0–0.2)

## 2020-03-16 LAB — I-STAT CHEM 8, ED
BUN: 26 mg/dL — ABNORMAL HIGH (ref 8–23)
Calcium, Ion: 1.23 mmol/L (ref 1.15–1.40)
Chloride: 110 mmol/L (ref 98–111)
Creatinine, Ser: 0.6 mg/dL (ref 0.44–1.00)
Glucose, Bld: 114 mg/dL — ABNORMAL HIGH (ref 70–99)
HCT: 38 % (ref 36.0–46.0)
Hemoglobin: 12.9 g/dL (ref 12.0–15.0)
Potassium: 4.3 mmol/L (ref 3.5–5.1)
Sodium: 144 mmol/L (ref 135–145)
TCO2: 24 mmol/L (ref 22–32)

## 2020-03-16 LAB — COMPREHENSIVE METABOLIC PANEL
ALT: 12 U/L (ref 0–44)
AST: 17 U/L (ref 15–41)
Albumin: 3.4 g/dL — ABNORMAL LOW (ref 3.5–5.0)
Alkaline Phosphatase: 136 U/L — ABNORMAL HIGH (ref 38–126)
Anion gap: 6 (ref 5–15)
BUN: 22 mg/dL (ref 8–23)
CO2: 22 mmol/L (ref 22–32)
Calcium: 8.8 mg/dL — ABNORMAL LOW (ref 8.9–10.3)
Chloride: 112 mmol/L — ABNORMAL HIGH (ref 98–111)
Creatinine, Ser: 0.75 mg/dL (ref 0.44–1.00)
GFR, Estimated: >60 mL/min (ref >60)
Glucose, Bld: 125 mg/dL — ABNORMAL HIGH (ref 70–99)
Potassium: 4.2 mmol/L (ref 3.5–5.1)
Sodium: 140 mmol/L (ref 135–145)
Total Bilirubin: 0.4 mg/dL (ref 0.3–1.2)
Total Protein: 6.4 g/dL — ABNORMAL LOW (ref 6.5–8.1)

## 2020-03-16 LAB — PREPARE RBC (CROSSMATCH)

## 2020-03-16 LAB — RESP PANEL BY RT-PCR (FLU A&B, COVID) ARPGX2
Influenza A by PCR: NEGATIVE
Influenza B by PCR: NEGATIVE
SARS Coronavirus 2 by RT PCR: NEGATIVE

## 2020-03-16 LAB — PROTIME-INR
INR: 1.3 — ABNORMAL HIGH (ref 0.8–1.2)
Prothrombin Time: 16 seconds — ABNORMAL HIGH (ref 11.4–15.2)

## 2020-03-16 LAB — ABO/RH: ABO/RH(D): B POS

## 2020-03-16 MED ORDER — SODIUM CHLORIDE 0.9 % IV SOLN
8.0000 mg/h | INTRAVENOUS | Status: DC
  Filled 2020-03-16: qty 80

## 2020-03-16 MED ORDER — IOHEXOL 350 MG/ML SOLN
80.0000 mL | Freq: Once | INTRAVENOUS | Status: AC | PRN
  Administered 2020-03-16: 80 mL via INTRAVENOUS

## 2020-03-16 MED ORDER — POLYETHYLENE GLYCOL 3350 17 G PO PACK: 17.0000 g | PACK | Freq: Every day | ORAL | Status: DC | PRN

## 2020-03-16 MED ORDER — SODIUM CHLORIDE 0.9 % IV BOLUS
500.0000 mL | Freq: Once | INTRAVENOUS | Status: AC
  Administered 2020-03-16: 500 mL via INTRAVENOUS

## 2020-03-16 MED ORDER — ATORVASTATIN CALCIUM 10 MG PO TABS
10.0000 mg | ORAL_TABLET | Freq: Every day | ORAL | Status: DC
  Administered 2020-03-18 – 2020-03-21 (×4): 10 mg via ORAL
  Filled 2020-03-16 (×4): qty 1

## 2020-03-16 MED ORDER — PANTOPRAZOLE SODIUM 40 MG IV SOLR: 40.0000 mg | Freq: Two times a day (BID) | INTRAVENOUS | Status: DC

## 2020-03-16 MED ORDER — DILTIAZEM HCL ER COATED BEADS 120 MG PO CP24: 120.0000 mg | ORAL_CAPSULE | Freq: Every day | ORAL | Status: DC

## 2020-03-16 MED ORDER — SODIUM CHLORIDE 0.9 % IV SOLN
80.0000 mg | Freq: Once | INTRAVENOUS | Status: DC
  Filled 2020-03-16: qty 80

## 2020-03-16 MED ORDER — ACETAMINOPHEN 650 MG RE SUPP: 650.0000 mg | Freq: Four times a day (QID) | RECTAL | Status: DC | PRN

## 2020-03-16 MED ORDER — ACETAMINOPHEN 325 MG PO TABS: 650.0000 mg | ORAL_TABLET | Freq: Four times a day (QID) | ORAL | Status: DC | PRN

## 2020-03-16 MED ORDER — PROTHROMBIN COMPLEX CONC HUMAN 500 UNITS IV KIT
1577.0000 [IU] | PACK | Status: AC
  Administered 2020-03-16: 1577 [IU] via INTRAVENOUS
  Filled 2020-03-16: qty 577

## 2020-03-16 MED ORDER — SODIUM CHLORIDE 0.9% IV SOLUTION: Freq: Once | INTRAVENOUS | Status: DC

## 2020-03-16 NOTE — Progress Notes (Signed)
Spoke with the GI physician who agreed to the consult.  Greatly appreciate his expertise and assistance in this patient.  Per his recommendation will put in for a PPI bolus with Protonix 80 mg IV followed by 8 mg/hr for 72 hours for concern of possible upper GI bleed with the patient's history of aspirin and ibuprofen.

## 2020-03-16 NOTE — ED Notes (Signed)
Pt had a syncopal episode while sitting on the [facility], ED Tech was supporting her up & staff came & lifted pt into supine position on the stretcher. Pt was very pale, unresponsive, & the BSC was full of a very large amount of dark red blood with many clots. EDP witnessed this episode.

## 2020-03-16 NOTE — ED Triage Notes (Signed)
Pot BIB [facility] d/t the staff at where she resides noticing pt was having a continuous show of dark blood in the last few hours. Upon arrival pt has on 2 briefs d/t bleeding through, she was pale & she is on eliquis.

## 2020-03-16 NOTE — ED Notes (Signed)
This RN has called to check on blood status.

## 2020-03-16 NOTE — Progress Notes (Signed)
I received a call back from the previous GI physician where I placed the consult and he informed me that this patient was not actually unassigned and did follow with [facility].  I put out a call to [facility] who promptly returned my consult and stated that they would indeed see this patient.  The [facility] physician recommended doing twice daily PPI 40 Protonix every 12 hours.  I have removed the previous order of the Protonix drip that was initially recommended by the unassigned GI physician.  [facility] states they would see the patient in the morning and for [place] to please let them know if the patient has any dramatic change in the meantime.  He states he would like the patient to be made n.p.o. at midnight in case they want to do a procedure such as a endoscope in the morning.  Greatly appreciate the experience and expertise of [facility].

## 2020-03-16 NOTE — ED Notes (Signed)
Pt is now alert & back to baseline, she still appears pale but not as bad as during her syncopal episode.

## 2020-03-16 NOTE — H&P (Addendum)
[facility] Admission History and Physical Service Pager: [phone]  Patient name: Melinda Edwards Medical record number: [id number] Date of birth: 06-27-1926 Age: 87 y.o. Gender: female  Primary Care Provider: Lajean Manes, MD Consultants: None Code Status: DNR  Preferred Emergency Contact: Melinda Edwards [phone] [facility] POA  Chief Complaint: Rectal bleeding  Assessment and Plan: Melinda Edwards is a 87 y.o. female presenting with rectal bleeding. PMH is significant for a-fib, prior CVA, HTN, HLD, bronchiectasis, L2 compression fracture and Alzheimer's dementia.  GI Bleed Patient presents with several episodes of BRBPR occurring today immediately prior to arrival. In the ED, she had ongoing rectal bleeding but all vitals remained stable.  No active bleeding noted on my exam. Hgb 12.1 on admission. Differential includes hemorrhoids, anal fissure (less likely given absence of pain and degree of bleeding), colorectal polyp or carcinoma, gastric ulcer secondary to NSAID use and anticoagulation (although would expect melena more than rectal bleeding).  The fact that the patient has been on aspirin, Eliquis, and has been using ibuprofen on and off for the past several weeks does increase the risk of bleeding.  While the patient did recently have a stroke which was thought to be embolic for which she is on the Eliquis due to history of atrial fibrillation patient is currently bleeding and so the risk of stroke is less than the patient's current risk, we will therefore stop the anticoagulation. HASBLED score of 4 (8.9% risk) and CHADS2VASC score of 6 (9.7%). -Admit to FPTS, progressive, attending Dr. Nori Riis -OOB with assistance only -Heart healthy diet -Repeat CBC at 1900 and tomorrow morning -Holding home Eliquis and ASA -Telemetry monitoring -GI consult, appreciate recommendations  Paroxysmal A-Fib on Eliquis Currently in sinus rhythm with HR in the 70s. On  Eliquis 5mg  BID and diltiazem 120mg  daily at home. A-fib was diagnosed in Feb 2021 during admission for acute CVA. CHADS2VASC score: 6 (age>75, female, HTN, and prior CVA). -Hold home Eliquis due to GI bleed -Continue home diltiazem -Telemetry monitoring  Recent L2 Compression Fracture Seen by sports med on 03/04/2020 and diagnosed with L2 compression fracture s/p fall earlier last month. Has been taking Ibuprofen and Tylenol for pain. -PT/OT eval -Avoid Ibuprofen due to GI bleed -Can add Tylenol q6h prn if needed for pain -Will contact SNF to confirm how frequently patient has been taking Ibuprofen  Hx of CVA Patient admitted February 2021 for acute CVA (left basal ganglia infarct). Thought to be possibly embolic source given a-fib, although no clots seen on echo at the time. On ASA and atorvastatin at home -Continue Atorvastatin 10mg  daily -Holding ASA in the setting of acute bleed  Hx of HTN Patient with hx of HTN per chart review, but not on any home meds. She has been normotensive since arrival. Most recent BP 112/58. -Monitor BP  HLD Lipid panel 05/11/19 showed total cholesterol 247, TG 94, HDL 50, LDL 178. Home meds: atorvastatin 10mg  daily -Continue home atorvastatin  Alzheimer's Dementia Oriented only to self and city at baseline.  -Delirium precautions  Mildly Elevated Alk Phos Alk Phos slightly elevated at 136 on admission. Was not previously elevated on prior admission in Feb 2021. Possibly related to -Consider repeat CMP as outpatient  Bronchiectasis  Chronic, stable. Patient stable on room air with SpO2 99-100% and no dyspnea.  FEN/GI: Heart healthy diet Prophylaxis: SCDs  Disposition: Progressive  History of Present Illness:  Melinda Edwards is a 87 y.o. female presenting with bright red  blood per rectum.    Patient and her POA live in a retirement home and noticed some bright red blood during a BM she had in the dining room followed by some blood in the  toilet. Per facility staff, there was a significant amount of blood per rectum.  In the ED, patient denies dizziness/lightheadedness, shortness of breath, abdominal pain or other complaints. She endorses slight fatigue. Of note, patient has been on ibuprofen for recent compression fracture, although it is unclear how frequently she's taking it. She also takes Eliquis for a prior CVA.  No bleeding from other sources, no abdominal pain, vomiting, diarrhea, or constipation. No prior hx of GI bleed.  Review Of Systems: Per HPI with the following additions:   Review of Systems  Constitutional: Negative for fatigue.  HENT: Negative for congestion.   Respiratory: Negative for cough and shortness of breath.   Gastrointestinal: Negative for constipation, diarrhea and vomiting.  Neurological: Negative for dizziness, speech difficulty and weakness.     Patient Active Problem List   Diagnosis Date Noted  . Atrial fibrillation with RVR ([place]) 05/14/2019  . Stroke ([place]) 05/10/2019  . Essential hypertension 05/10/2019  . Memory disorder 10/03/2013  . Nocturnal leg cramps 08/08/2012  . Aortic stenosis, mild 04/13/2011  . Benign hypertensive heart disease without heart failure 08/13/2010  . Hyperlipidemia 08/13/2010  . Palpitations 08/13/2010  . Bronchiectasis without acute exacerbation ([place]) 11/15/2007    Past Medical History: Past Medical History:  Diagnosis Date  . Alzheimer's dementia ([place])   . Hyperlipidemia   . Hypertension   . PAC (premature atrial contraction)    BENIGH  . Palpitations   . Pulmonary fibrosis ([place])   . Pulmonary nodule   . Stroke ([place]) 05/10/2019  . Stroke [facility]) 06/16/2019    Past Surgical History: Past Surgical History:  Procedure Laterality Date  . BREAST EXCISIONAL BIOPSY Right 2003  . DILATION AND CURETTAGE OF UTERUS      Social History: Social History   Tobacco Use  . Smoking status: Former Smoker    Packs/day: 1.00    Types: Cigarettes    Quit  date: 08/12/1995    Years since quitting: 24.6  . Smokeless tobacco: Never Used  Substance Use Topics  . Alcohol use: No  . Drug use: No    Family History: Family History  Problem Relation Age of Onset  . Heart disease Other   . Heart attack Brother        x 3  . Heart failure Mother        chf    Allergies and Medications: Allergies  Allergen Reactions  . Antihistamines, Loratadine-Type   . Loratadine Other (See Comments)    Nose bleed   No current facility-administered medications on file prior to encounter.   Current Outpatient Medications on File Prior to Encounter  Medication Sig Dispense Refill  . apixaban (ELIQUIS) 5 MG TABS tablet Take 1 tablet (5 mg total) by mouth 2 (two) times daily. 60 tablet   . aspirin EC 81 MG tablet Take 1 tablet (81 mg total) by mouth daily.    Marland Kitchen atorvastatin (LIPITOR) 10 MG tablet Take 1 tablet (10 mg total) by mouth daily. 90 tablet 3  . diltiazem (CARDIZEM CD) 120 MG 24 hr capsule Take 1 capsule (120 mg total) by mouth daily. 30 capsule 11  . potassium chloride SA (K-DUR,KLOR-CON) 20 MEQ tablet Take 1 tablet (20 mEq total) by mouth daily. 90 tablet 3    Objective: BP Marland Kitchen)  121/59   Pulse 64   Temp 97.9 F (36.6 C) (Oral)   Resp 18   SpO2 97%  Exam: General: Elderly female laying in bed. Alert. NAD Eyes: pale conjunctiva ENTM: moist mucous membranes, oropharynx clear Neck: supple Cardiovascular: RRR, normal S1/S2 Respiratory: normal WOB on room air, lungs CTAB Gastrointestinal: +BS, soft, nontender, nondistended MSK: no peripheral edema Derm: few scattered ecchymoses, no rashes Neuro: A&O to person/place, not to time. CN II-XII intact, strength 5/5 in upper and lower extremities bilaterally. Fine touch sensation in tact. Psych: Normal mood and affect  Labs and Imaging: CBC BMET  Recent Labs  Lab 03/16/20 1341 03/16/20 1355  WBC 6.9  --   HGB 12.1 12.9  HCT 39.6 38.0  PLT 256  --    Recent Labs  Lab 03/16/20 1341  03/16/20 1355  NA 140 144  K 4.2 4.3  CL 112* 110  CO2 22  --   BUN 22 26*  CREATININE 0.75 0.60  GLUCOSE 125* 114*  CALCIUM 8.8*  --      EKG: No EKG obtained  DG Chest Port 1 View Result Date: 03/16/2020 IMPRESSION: Biapical scarring.  No active disease.    Most recent echo 05/11/19: EF 71-83%, Grade I diastolic dysfunction   Alcus Dad, MD 03/16/2020, 3:01 PM PGY-1, [place] Intern pager: [phone], text pages welcome  Upper Level Addendum:  I have seen and evaluated this patient along with Dr. Rock Nephew and reviewed the above note, making necessary revisions in green.   Lurline Del, DO 03/16/2020, 5:16 PM PGY-2, [place]

## 2020-03-16 NOTE — Progress Notes (Signed)
FPTS Interim Progress Note  S: Patient appears pale, but reports no abdominal pain.  O: BP 121/84   Pulse 79   Temp 97.6 F (36.4 C) (Axillary)   Resp (!) 27   SpO2 96%   Nursing notes and vitals reviewed GEN: elderly WW resting comfortably in bed, pale-appearing, NAD, WNWD HEENT: NCAT. PERRLA. Sclera without injection or icterus. MMM.   Cardiac: Regular rate and rhythm. Normal S1/S2. No murmurs, rubs, or gallops appreciated. 2+ radial pulses. Abdomen: Normoactive bowel sounds. No tenderness to deep or light palpation. No rebound or guarding.    Neuro: AOx3  Ext: no edema Psych: Pleasant and appropriate   A/P: GI Bleed Patient had another episode recently of BRBPR recently, according to nursing approx 500cc of blood. Previously ordered 1U PRBCs is on its way, reportedly being delivered now. H&H from 5 PM was drawn, but not transported to lab. Repeat H&H drawn at 1930, awaiting result. Will continue to monitor closely. Patient has also had heart rate of about 68-70 bpm. She received her cardizem xr this morning. Will hold cardizem tomorrow morning, as I would prefer for her to have an appropriate physiologic response to anemia.  Shirlean Mylar, MD 03/16/2020, 10:15 PM PGY-2, [facility] Family Medicine Service pager [phone]

## 2020-03-16 NOTE — ED Provider Notes (Addendum)
[facility] EMERGENCY DEPARTMENT Provider Note   CSN: [id number] Arrival date & time: 03/16/20  1304     History Chief Complaint  Patient presents with  . GI Bleed    Melinda Edwards is a 87 y.o. female.  The history is provided by the patient and medical records.   Melinda Edwards is a 87 y.o. female who presents to the Emergency Department complaining of rectal bleeding.  Level V caveat due to confusion.  Hx is provided by EMS. She presents the emergency department by EMS from her nursing facility for evaluation of bright red blood per rectum that started a few hours prior to ED arrival. She does take Eliquis 5mg  BID. No reports of recent illnesses. Nursing home staff state the patient looks pale compared to her baseline. No history of priors. On ED arrival patient denies any complaints. She is unaware of bleeding earlier today.    Past Medical History:  Diagnosis Date  . Alzheimer's dementia ([place])   . Hyperlipidemia   . Hypertension   . PAC (premature atrial contraction)    BENIGH  . Palpitations   . Pulmonary fibrosis ([place])   . Pulmonary nodule   . Stroke ([place]) 05/10/2019  . Stroke [facility]) 06/16/2019    Patient Active Problem List   Diagnosis Date Noted  . Atrial fibrillation with RVR ([place]) 05/14/2019  . Stroke ([place]) 05/10/2019  . Essential hypertension 05/10/2019  . Memory disorder 10/03/2013  . Nocturnal leg cramps 08/08/2012  . Aortic stenosis, mild 04/13/2011  . Benign hypertensive heart disease without heart failure 08/13/2010  . Hyperlipidemia 08/13/2010  . Palpitations 08/13/2010  . Bronchiectasis without acute exacerbation ([place]) 11/15/2007    Past Surgical History:  Procedure Laterality Date  . BREAST EXCISIONAL BIOPSY Right 2003  . DILATION AND CURETTAGE OF UTERUS       OB History   No obstetric history on file.     Family History  Problem Relation Age of Onset  . Heart disease Other   . Heart attack Brother        x 3  .  Heart failure Mother        chf    Social History   Tobacco Use  . Smoking status: Former Smoker    Packs/day: 1.00    Types: Cigarettes    Quit date: 08/12/1995    Years since quitting: 24.6  . Smokeless tobacco: Never Used  Substance Use Topics  . Alcohol use: No  . Drug use: No    Home Medications Prior to Admission medications   Medication Sig Start Date End Date Taking? Authorizing Provider  apixaban (ELIQUIS) 5 MG TABS tablet Take 1 tablet (5 mg total) by mouth 2 (two) times daily. 05/14/19   Charlynne Cousins, MD  aspirin EC 81 MG tablet Take 1 tablet (81 mg total) by mouth daily. 05/14/19   Charlynne Cousins, MD  atorvastatin (LIPITOR) 10 MG tablet Take 1 tablet (10 mg total) by mouth daily. 10/19/13   Darlin Coco, MD  diltiazem (CARDIZEM CD) 120 MG 24 hr capsule Take 1 capsule (120 mg total) by mouth daily. 05/14/19 05/13/20  Charlynne Cousins, MD  potassium chloride SA (K-DUR,KLOR-CON) 20 MEQ tablet Take 1 tablet (20 mEq total) by mouth daily. 10/19/13   Darlin Coco, MD    Allergies    Antihistamines, loratadine-type and Loratadine  Review of Systems   Review of Systems  All other systems reviewed and are negative.   Physical  Exam Updated Vital Signs There were no vitals taken for this visit.  Physical Exam Vitals and nursing note reviewed.  Constitutional:      Appearance: She is well-developed and well-nourished.  HENT:     Head: Normocephalic and atraumatic.  Cardiovascular:     Rate and Rhythm: Normal rate and regular rhythm.     Heart sounds: No murmur heard.   Pulmonary:     Effort: Pulmonary effort is normal. No respiratory distress.     Breath sounds: Normal breath sounds.  Abdominal:     Palpations: Abdomen is soft.     Tenderness: There is no abdominal tenderness. There is no guarding or rebound.  Genitourinary:    Comments: Large amount of dark blood  Musculoskeletal:        General: No tenderness or edema.  Skin:    General:  Skin is warm and dry.  Neurological:     Mental Status: She is alert and oriented to person, place, and time.  Psychiatric:        Mood and Affect: Mood and affect normal.        Behavior: Behavior normal.     ED Results / Procedures / Treatments   Labs (all labs ordered are listed, but only abnormal results are displayed) Labs Reviewed - No data to display  EKG None  Radiology No results found.  Procedures Procedures (including critical care time) CRITICAL CARE Performed by: Quintella Reichert   Total critical care time: 45 minutes  Critical care time was exclusive of separately billable procedures and treating other patients.  Critical care was necessary to treat or prevent imminent or life-threatening deterioration.  Critical care was time spent personally by me on the following activities: development of treatment plan with patient and/or surrogate as well as nursing, discussions with consultants, evaluation of patient's response to treatment, examination of patient, obtaining history from patient or surrogate, ordering and performing treatments and interventions, ordering and review of laboratory studies, ordering and review of radiographic studies, pulse oximetry and re-evaluation of patient's condition.  Medications Ordered in ED Medications - No data to display  ED Course  I have reviewed the triage vital signs and the nursing notes.  Pertinent labs & imaging results that were available during my care of the patient were reviewed by me and considered in my medical decision making (see chart for details).    MDM Rules/Calculators/A&P                         patient here for evaluation of hematochezia that started earlier today. She is in no acute distress on evaluation. Per report she appears more pale than baseline. She has no acute complaints. She does have gross blood on examination. Hemoglobin is stable. Given her anticoagulation and degree of bleeding recommend  observation. Medicine consulted for admission. Patient and her friend power of attorney updated findings of studies recommendation for admission and they are in agreement with treatment plan.  Called bedside for change in patient status. She had a massive amount of dark red blood per rectum. On assessment patient is lethargic, pale lying on the stretcher. She does respond to verbal stimuli. She is confused. Given amount of bleeding present will treat with IV fluids, blood transfusion, [facility] for reversal of Eliquis. Plan to obtain CTA to further evaluate source of bleeding. Patient's friend updated findings of studies and change in status. Discussed with family medicine service change in patient status as  well.    Final Clinical Impression(s) / ED Diagnoses Final diagnoses:  GI bleed    Rx / DC Orders ED Discharge Orders    None       Tilden Fossa, MD 03/16/20 1608    Tilden Fossa, MD 03/16/20 1642

## 2020-03-16 NOTE — Progress Notes (Addendum)
FPTS Interim Progress Note  Paged by ED provider for episode of large amount of bright red blood per rectum. Per ED physician, patient had significant output, became pale, and less responsive than prior. ED provider ordered STAT H/H, CTA abdomen/pelvis, and 1u pRBCs.  Went to bedside to examine patient and she was alert, resting comfortably in bed. Patient denied complaints. No lightheadedness, abdominal pain, shortness of breath or other symptoms.   Vitals remain stable without tachycardia or hypotension. She does appear more pale than prior.  A/P: GI Bleed Currently hemodynamically stable but with potential for rapid decompensation given ongoing bleeding. -follow-up H/H and CTA abdomen/pelvis results -will change bed request from tele to progressive -will consult GI -continue to monitor closely    Maury Dus, MD 03/16/2020, 5:04 PM PGY-1, [facility] Family Medicine Service pager [phone]

## 2020-03-16 NOTE — Hospital Course (Addendum)
Melinda Edwards is a 87 y.o. female presenting with rectal bleeding. PMH is significant for a-fib, prior CVA, HTN, HLD, bronchiectasis, L2 compression fracture and Alzheimer's dementia.  GI bleed Patient presented with several large episodes of bright red blood per rectum. CTA abdomen/pelvis was unremarkable other than mild diverticulosis. She was transfused 1u pRBCs and H/H was trended until it was stable.*** GI followed the patient and recommended medical management with IV Protonix, monitoring of CBC and transfusing as needed. They ultimately thought the etiology was diverticular bleed vs. hemorrhoids.  A-Fib Patient with a hx of a-fib, on Eliquis and Diltiazem at home. Her Eliquis was held on admission due to active bleeding, but was restarted on *** after patient did not have any bleeding for 48hrs. Her cardizem was held for one day (1/3) in order to see physiologic response to anemia and her HR subsequently increased >120, so it was re-started on 1/4.  Hx of CVA Patient's ASA was held due to GI bleed but was restarted ***.   The remainder of patient's chronic medical problems remained stable throughout admission and she was maintained on her home medications.  PCP follow-up task: -CT angio showed-1.5 x 1.2 cm right breast nodule, nonspecific. Correlation with physical examination and consideration for follow-up mammography if clinically appropriate is recommended. -CT angio also showed left adrenal adenoma -Consider repeat CMP (alk phos elevated on admission) - Recommend PPI for GI protection with hx of ibuprofen use and GI bleed

## 2020-03-17 ENCOUNTER — Encounter (HOSPITAL_COMMUNITY): Payer: Self-pay | Admitting: Family Medicine

## 2020-03-17 DIAGNOSIS — R000 Tachycardia, unspecified: Secondary | ICD-10-CM | POA: Diagnosis present

## 2020-03-17 DIAGNOSIS — D3502 Benign neoplasm of left adrenal gland: Secondary | ICD-10-CM | POA: Diagnosis present

## 2020-03-17 DIAGNOSIS — I480 Paroxysmal atrial fibrillation: Secondary | ICD-10-CM | POA: Diagnosis present

## 2020-03-17 DIAGNOSIS — J479 Bronchiectasis, uncomplicated: Secondary | ICD-10-CM | POA: Diagnosis present

## 2020-03-17 DIAGNOSIS — E876 Hypokalemia: Secondary | ICD-10-CM | POA: Diagnosis present

## 2020-03-17 DIAGNOSIS — S32000D Wedge compression fracture of unspecified lumbar vertebra, subsequent encounter for fracture with routine healing: Secondary | ICD-10-CM

## 2020-03-17 DIAGNOSIS — Z20822 Contact with and (suspected) exposure to covid-19: Secondary | ICD-10-CM | POA: Diagnosis present

## 2020-03-17 DIAGNOSIS — I119 Hypertensive heart disease without heart failure: Secondary | ICD-10-CM | POA: Diagnosis present

## 2020-03-17 DIAGNOSIS — G309 Alzheimer's disease, unspecified: Secondary | ICD-10-CM | POA: Insufficient documentation

## 2020-03-17 DIAGNOSIS — W19XXXA Unspecified fall, initial encounter: Secondary | ICD-10-CM | POA: Diagnosis not present

## 2020-03-17 DIAGNOSIS — K649 Unspecified hemorrhoids: Secondary | ICD-10-CM | POA: Diagnosis present

## 2020-03-17 DIAGNOSIS — Z8673 Personal history of transient ischemic attack (TIA), and cerebral infarction without residual deficits: Secondary | ICD-10-CM | POA: Diagnosis not present

## 2020-03-17 DIAGNOSIS — N63 Unspecified lump in unspecified breast: Secondary | ICD-10-CM | POA: Diagnosis present

## 2020-03-17 DIAGNOSIS — R748 Abnormal levels of other serum enzymes: Secondary | ICD-10-CM | POA: Diagnosis present

## 2020-03-17 DIAGNOSIS — R710 Precipitous drop in hematocrit: Secondary | ICD-10-CM

## 2020-03-17 DIAGNOSIS — Z8249 Family history of ischemic heart disease and other diseases of the circulatory system: Secondary | ICD-10-CM | POA: Diagnosis not present

## 2020-03-17 DIAGNOSIS — F0280 Dementia in other diseases classified elsewhere without behavioral disturbance: Secondary | ICD-10-CM | POA: Diagnosis present

## 2020-03-17 DIAGNOSIS — Z7982 Long term (current) use of aspirin: Secondary | ICD-10-CM | POA: Diagnosis not present

## 2020-03-17 DIAGNOSIS — E785 Hyperlipidemia, unspecified: Secondary | ICD-10-CM | POA: Diagnosis present

## 2020-03-17 DIAGNOSIS — K922 Gastrointestinal hemorrhage, unspecified: Secondary | ICD-10-CM | POA: Diagnosis present

## 2020-03-17 DIAGNOSIS — Y92239 Unspecified place in hospital as the place of occurrence of the external cause: Secondary | ICD-10-CM | POA: Diagnosis not present

## 2020-03-17 DIAGNOSIS — Z888 Allergy status to other drugs, medicaments and biological substances status: Secondary | ICD-10-CM | POA: Diagnosis not present

## 2020-03-17 DIAGNOSIS — Z7901 Long term (current) use of anticoagulants: Secondary | ICD-10-CM | POA: Diagnosis not present

## 2020-03-17 DIAGNOSIS — W19XXXD Unspecified fall, subsequent encounter: Secondary | ICD-10-CM | POA: Diagnosis present

## 2020-03-17 DIAGNOSIS — D62 Acute posthemorrhagic anemia: Secondary | ICD-10-CM | POA: Diagnosis present

## 2020-03-17 DIAGNOSIS — Z79899 Other long term (current) drug therapy: Secondary | ICD-10-CM | POA: Diagnosis not present

## 2020-03-17 DIAGNOSIS — S32029D Unspecified fracture of second lumbar vertebra, subsequent encounter for fracture with routine healing: Secondary | ICD-10-CM | POA: Diagnosis not present

## 2020-03-17 DIAGNOSIS — Z66 Do not resuscitate: Secondary | ICD-10-CM | POA: Diagnosis present

## 2020-03-17 DIAGNOSIS — Z87891 Personal history of nicotine dependence: Secondary | ICD-10-CM | POA: Diagnosis not present

## 2020-03-17 DIAGNOSIS — K5731 Diverticulosis of large intestine without perforation or abscess with bleeding: Secondary | ICD-10-CM | POA: Diagnosis present

## 2020-03-17 LAB — CBC
HCT: 33.2 % — ABNORMAL LOW (ref 36.0–46.0)
HCT: 27 % — ABNORMAL LOW (ref 36.0–46.0)
Hemoglobin: 10 g/dL — ABNORMAL LOW (ref 12.0–15.0)
Hemoglobin: 8.6 g/dL — ABNORMAL LOW (ref 12.0–15.0)
MCH: 24.9 pg — ABNORMAL LOW (ref 26.0–34.0)
MCH: 26.1 pg (ref 26.0–34.0)
MCHC: 30.1 g/dL (ref 30.0–36.0)
MCHC: 31.9 g/dL (ref 30.0–36.0)
MCV: 82.6 fL (ref 80.0–100.0)
MCV: 81.8 fL (ref 80.0–100.0)
Platelets: 229 10*3/uL (ref 150–400)
Platelets: 197 10*3/uL (ref 150–400)
RBC: 4.02 MIL/uL (ref 3.87–5.11)
RBC: 3.3 MIL/uL — ABNORMAL LOW (ref 3.87–5.11)
RDW: 23.2 % — ABNORMAL HIGH (ref 11.5–15.5)
RDW: 23.5 % — ABNORMAL HIGH (ref 11.5–15.5)
WBC: 9.8 10*3/uL (ref 4.0–10.5)
WBC: 8.7 10*3/uL (ref 4.0–10.5)
nRBC: 0 % (ref 0.0–0.2)
nRBC: 0 % (ref 0.0–0.2)

## 2020-03-17 LAB — BASIC METABOLIC PANEL
Anion gap: 7 (ref 5–15)
BUN: 20 mg/dL (ref 8–23)
CO2: 20 mmol/L — ABNORMAL LOW (ref 22–32)
Calcium: 8.4 mg/dL — ABNORMAL LOW (ref 8.9–10.3)
Chloride: 114 mmol/L — ABNORMAL HIGH (ref 98–111)
Creatinine, Ser: 0.67 mg/dL (ref 0.44–1.00)
GFR, Estimated: >60 mL/min (ref >60)
Glucose, Bld: 125 mg/dL — ABNORMAL HIGH (ref 70–99)
Potassium: 4.1 mmol/L (ref 3.5–5.1)
Sodium: 141 mmol/L (ref 135–145)

## 2020-03-17 LAB — HEMOGLOBIN AND HEMATOCRIT, BLOOD
HCT: 24.5 % — ABNORMAL LOW (ref 36.0–46.0)
Hemoglobin: 7.6 g/dL — ABNORMAL LOW (ref 12.0–15.0)

## 2020-03-17 MED ORDER — SODIUM CHLORIDE 0.9 % IV SOLN: INTRAVENOUS | Status: DC

## 2020-03-17 NOTE — Progress Notes (Signed)
Family Medicine Teaching Service Daily Progress Note Intern Pager: [phone]  Patient name: Melinda Edwards Medical record number: [id number] Date of birth: [birth year] Age: 87 y.o. Gender: female  Primary Care Provider: Lajean Manes, MD Consultants: GI Code Status: DNR  Pt Overview and Major Events to Date:  1/2: admitted, GI consulted, transfused 1u pRBCs  Assessment and Plan:  Melinda Edwards is a 87 y.o. female presenting with rectal bleeding. PMH is significant for a-fib, prior CVA, HTN, HLD, bronchiectasis, L2 compression fracture and Alzheimer's dementia.  GI Bleed Had multiple additional episodes of BRBPR overnight. Vitals remain stable without tachycardia or hypotension. Hemoglobin 12.1>9.3>10.0 this morning s/p 1u pRBCs. CTA obtained yesterday was overall unremarkable. -GI following, appreciate recommendations -Has been NPO since midnight for possible procedural intervention -Continue Protonix 40mg  IV BID -Holding home Eliquis and ASA -CBC q12h  Paroxysmal A-Fib Chronic, stable. Remains in sinus rhythm, HR 80s. Home meds: Eliquis 5mg  BID and diltiazem 120mg  daily. CHADS2VASC score is 6. -Holding home Eliquis due to active bleed -Holding home diltiazem today (would like to see physiologic response to anemia) -Telemetry monitoring  Recent L2 Compression Fracture Seen by sports med on 03/04/2020 and diagnosed with L2 compression fracture s/p fall earlier last month. Has been taking Ibuprofen and Tylenol for pain. -PT/OT eval -Avoid Ibuprofen due to GI bleed -Can add Tylenol q6h prn if needed for pain  Hx of CVA No residual deficits. On ASA and Atorvastatin at home. -Continue Atorvastatin 10mg  daily -Holding ASA in the setting of acute bleed  Hx of HTN Patient with hx of HTN per chart review, but not on any home meds. She has been normotensive since arrival. Most recent BP 107/63. -Monitor BP  HLD Chronic, stable. Home meds: atorvastatin 10mg  daily -Continue  home atorvastatin  Alzheimer's Dementia Oriented only to self and city at baseline.  -Delirium precautions  Mildly Elevated Alk Phos Alk Phos slightly elevated at 136 on admission. Was not previously elevated on prior admission in Feb 2021. -Consider repeat CMP as outpatient  Bronchiectasis  Chronic, stable. Patient stable on room air with SpO2 99-100% and no dyspnea.   FEN/GI: NPO (possible GI procedural intervention) PPx: SCDs    Status is: Observation The patient will require care spanning > 2 midnights and should be moved to inpatient because: Ongoing diagnostic testing needed not appropriate for outpatient work up  Dispo:  Patient From: Other  Planned Disposition: Other  Expected discharge date: 03/19/2020  Medically stable for discharge: No    Subjective:  Patient did have a fall overnight last night. Was evaluated at the time and did not have any injuries or acute changes. She denies complaints this morning. No dizziness/lightheadedness, SOB, or abdominal pain.  Objective: Temp:  [97.6 F (36.4 C)-97.9 F (36.6 C)] 97.8 F (36.6 C) (01/03 0138) Pulse Rate:  [60-104] 85 (01/03 0430) Resp:  [12-27] 12 (01/03 0430) BP: (73-161)/(47-124) 107/63 (01/03 0430) SpO2:  [95 %-100 %] 96 % (01/03 0430) Physical Exam: General: very pleasant, alert, NAD Cardiovascular: RRR, normal S1/S2 without m/r/g Respiratory: normal WOB, lungs CTAB Abdomen: soft, nontender, nondistended Extremities: scattered ecchymosis on bilateral lower extremities Neuro: alert, oriented to self only. CN II-XII intact. 5/5 strength in bilateral upper and lower extremities  Laboratory: Recent Labs  Lab 03/16/20 1341 03/16/20 1355 03/16/20 2244 03/17/20 0355  WBC 6.9  --  9.2 9.8  HGB 12.1 12.9 9.3* 10.0*  HCT 39.6 38.0 30.4* 33.2*  PLT 256  --  232 229   Recent Labs  Lab 03/16/20 1341 03/16/20 1355 03/17/20 0355  NA 140 144 141  K 4.2 4.3 4.1  CL 112* 110 114*  CO2 22  --  20*   BUN 22 26* 20  CREATININE 0.75 0.60 0.67  CALCIUM 8.8*  --  8.4*  PROT 6.4*  --   --   BILITOT 0.4  --   --   ALKPHOS 136*  --   --   ALT 12  --   --   AST 17  --   --   GLUCOSE 125* 114* 125*     Imaging/Diagnostic Tests:  CT Angio Abd/Pel W and/or Wo Contrast Result Date: 03/16/2020 IMPRESSION:  VASCULAR 1. No acute vascular abnormality in the abdomen or pelvis. Aortic atherosclerosis.  NON-VASCULAR 1. No acute findings are noted in the abdomen or pelvis. 2. 1.5 x 1.2 cm right breast nodule, nonspecific. Correlation with physical examination and consideration for follow-up mammography if clinically appropriate is recommended. 3. Scattered areas of cylindrical bronchiectasis and post infectious scarring in the visualized lung bases. 4. Left adrenal adenoma. 5. Mild colonic diverticulosis without evidence of acute diverticulitis at this time. 6. Additional incidental findings, as above. Electronically Signed   By: Vinnie Langton M.D.   On: 03/16/2020 18:45     Alcus Dad, MD 03/17/2020, 6:31 AM PGY-1, [place] Intern pager: [phone], text pages welcome

## 2020-03-17 NOTE — Discharge Instructions (Addendum)
Dear Melinda Edwards,   Thank you for letting us participate in your care! In this section, you will find a brief summary of why you were admitted to the hospital, what happened during your admission, your diagnosis/diagnoses, and recommended follow up.   You were admitted because you were experiencing rectal bleeding.   You were diagnosed with a lower GI bleed. It was thought to be related to several things: diverticulosis, hemorrhoids, and using blood thinners and ibuprofen concurrently.  You were also seen by the gastroenterology (GI) doctors.   Your hemoglobin (blood levels) improved and you were discharged from the hospital for meeting this goal.   Reasons to return to the hospital: If you have excessive bleeding leading to: -fatigue -shortness of breath -paleness -chest pain or heart racing sensation -dizziness or lightheadedness  POST-HOSPITAL & CARE INSTRUCTIONS 1. STOP taking Ibuprofen 2. STOP taking Eliquis (Apixaban) 3. For now, do not take Aspirin. Your doctor can consider re-starting Aspirin 81mg  if you go 48-72 hours without any bleeding. 4. Your doctor should recheck your blood levels in 2-3 days  DOCTOR'S APPOINTMENTS & FOLLOW UP Future Appointments  Date Time Provider Department Center  03/31/2020  1:45 PM 04/02/2020, NP [facility] None    Thank you for choosing [facility]! Take care and be well!  Dr. [facility] and the [facility] Medicine Inpatient Team  [facility]  [address] [place], [place] [phone]

## 2020-03-17 NOTE — Progress Notes (Signed)
Called patient's POA to give her an update on patient's status x2 without answer. Left a message x2.  Will attempt

## 2020-03-17 NOTE — ED Notes (Signed)
Pt was found repeatedly climbing out of bed, she had an incontinent episode of a large amount of clotted/dark-red blood. Pt was cleaned, all sheets & gown replaced. Provider came to round on her at the same time as providing pt care & was informed. Pt remained A/O to baseline & denies pain. Safety sitter has been ordered to prevent falling again.

## 2020-03-17 NOTE — ED Notes (Signed)
Fall Event  Pt was found down on floor of room, undressed, and partially under the bed. A large amount of blood was on the patient and on the floor overfilling the patients depends. Stretcher was locked in lowest position and rails were in place. Pt was assessed for injuries and evaluated with no injuries noted.

## 2020-03-17 NOTE — Consult Note (Addendum)
Referring Provider: ED Primary Care Physician:  Lajean Manes, MD Primary Gastroenterologist: [facility] (prior patient of Dr. Wynetta Emery)  Reason for Consultation:  Rectal bleeding  HPI: Melinda Edwards is a 87 y.o. female with history of A fib (on Eliquis and ASA) and Alzheimer's dementia presenting for consultation of rectal bleeding.  Patient was brought to the ED yesterday from nursing facility and due to bright red blood per rectum starting yesterday.  Patient denies any complaints.  Denies any abdominal pain.  She is able to report that she denies rectal bleeding, though will is unsure when her last bowel movement was.  CT-A 03/17/19 without any active bleeding. Mild colonic diverticulosis without evidence of acute diverticulitis at this time.  Colonoscopy 06/2008: hyperplastic polyp, diverticulosis  Past Medical History:  Diagnosis Date   Alzheimer's dementia ([place])    Hyperlipidemia    Hypertension    PAC (premature atrial contraction)    BENIGH   Palpitations    Pulmonary fibrosis ([place])    Pulmonary nodule    Stroke ([place]) 05/10/2019   Stroke ([place]) 06/16/2019    Past Surgical History:  Procedure Laterality Date   BREAST EXCISIONAL BIOPSY Right 2003   DILATION AND CURETTAGE OF UTERUS      Prior to Admission medications   Medication Sig Start Date End Date Taking? Authorizing Provider  acetaminophen (TYLENOL) 500 MG tablet Take 1,000 mg by mouth in the morning and at bedtime.   Yes [provider]  apixaban (ELIQUIS) 5 MG TABS tablet Take 1 tablet (5 mg total) by mouth 2 (two) times daily. 05/14/19  Yes Charlynne Cousins, MD  aspirin EC 81 MG tablet Take 1 tablet (81 mg total) by mouth daily. 05/14/19  Yes Charlynne Cousins, MD  atorvastatin (LIPITOR) 10 MG tablet Take 1 tablet (10 mg total) by mouth daily. 10/19/13  Yes Darlin Coco, MD  diltiazem (CARDIZEM CD) 120 MG 24 hr capsule Take 1 capsule (120 mg total) by mouth daily. 05/14/19 05/13/20 Yes Charlynne Cousins, MD  ibuprofen (ADVIL) 400 MG tablet Take 400 mg by mouth 2 (two) times daily.   Yes [provider]  potassium chloride SA (K-DUR,KLOR-CON) 20 MEQ tablet Take 1 tablet (20 mEq total) by mouth daily. 10/19/13  Yes Darlin Coco, MD    Scheduled Meds:  sodium chloride   Intravenous Once   atorvastatin  10 mg Oral Daily   [START ON 03/20/2020] pantoprazole  40 mg Intravenous Q12H   Continuous Infusions:  sodium chloride 75 mL/hr at 03/17/20 0912   PRN Meds:.acetaminophen **OR** acetaminophen, polyethylene glycol  Allergies as of 03/16/2020 - Review Complete 03/16/2020  Allergen Reaction Noted   Antihistamines, loratadine-type Other (See Comments) 02/18/2020   Loratadine Other (See Comments) 08/27/2019   Namenda [memantine] Other (See Comments) 08/27/2019    Family History  Problem Relation Age of Onset   Heart disease Other    Heart attack Brother        x 3   Heart failure Mother        chf    Social History   Socioeconomic History   Marital status: Single    Spouse name: Not on file   Number of children: 0   Years of education: Not on file   Highest education level: Not on file  Occupational History   Occupation: Retired  Tobacco Use   Smoking status: Former Smoker    Packs/day: 1.00    Types: Cigarettes    Quit date: 08/12/1995  Years since quitting: 24.6   Smokeless tobacco: Never Used  Substance and Sexual Activity   Alcohol use: No   Drug use: No   Sexual activity: Not on file  Other Topics Concern   Not on file  Social History Narrative   ** Merged History Encounter **       06/14/19 living at [facility] [place]   Social Determinants of Health   Financial Resource Strain: Not on file  Food Insecurity: Not on file  Transportation Needs: Not on file  Physical Activity: Not on file  Stress: Not on file  Social Connections: Not on file  Intimate Partner Violence: Not on file    Review of Systems: Review of Systems  Unable to perform ROS:  Dementia    Physical Exam: Vital signs: Vitals:   03/17/20 0915 03/17/20 0934  BP: (!) 117/99 (!) 156/117  Pulse: 84 84  Resp: 18 (!) 21  Temp:    SpO2: 100% 100%     Physical Exam Vitals reviewed.  Constitutional:      General: She is not in acute distress. HENT:     Head: Normocephalic and atraumatic.     Mouth/Throat:     Mouth: Mucous membranes are moist.     Pharynx: Oropharynx is clear.  Eyes:     General: No scleral icterus.    Extraocular Movements: Extraocular movements intact.     Conjunctiva/sclera: Conjunctivae normal.  Cardiovascular:     Rate and Rhythm: Normal rate and regular rhythm.     Pulses: Normal pulses.  Pulmonary:     Effort: Pulmonary effort is normal. No respiratory distress.     Breath sounds: Normal breath sounds.  Abdominal:     General: Bowel sounds are normal. There is no distension.     Palpations: Abdomen is soft. There is no mass.     Tenderness: There is no abdominal tenderness. There is no guarding or rebound.     Hernia: No hernia is present.  Musculoskeletal:        General: No swelling or tenderness.     Cervical back: Normal range of motion and neck supple.  Skin:    General: Skin is warm and dry.  Neurological:     Mental Status: She is alert. Mental status is at baseline. She is disoriented.  Psychiatric:        Mood and Affect: Mood normal.        Behavior: Behavior normal.     GI:  Lab Results: Recent Labs    03/16/20 1341 03/16/20 1355 03/16/20 2244 03/17/20 0355  WBC 6.9  --  9.2 9.8  HGB 12.1 12.9 9.3* 10.0*  HCT 39.6 38.0 30.4* 33.2*  PLT 256  --  232 229   BMET Recent Labs    03/16/20 1341 03/16/20 1355 03/17/20 0355  NA 140 144 141  K 4.2 4.3 4.1  CL 112* 110 114*  CO2 22  --  20*  GLUCOSE 125* 114* 125*  BUN 22 26* 20  CREATININE 0.75 0.60 0.67  CALCIUM 8.8*  --  8.4*   LFT Recent Labs    03/16/20 1341  PROT 6.4*  ALBUMIN 3.4*  AST 17  ALT 12  ALKPHOS 136*  BILITOT 0.4    PT/INR Recent Labs    03/16/20 1341  LABPROT 16.0*  INR 1.3*     Studies/Results: DG Chest Port 1 View  Result Date: 03/16/2020 CLINICAL DATA:  GI bleed EXAM: PORTABLE CHEST 1 VIEW COMPARISON:  05/10/2019  FINDINGS: Biapical scarring. Heart is normal size. No effusions or acute bony abnormality. IMPRESSION: Biapical scarring.  No active disease. Electronically Signed   By: Rolm Baptise M.D.   On: 03/16/2020 14:17   CT Angio Abd/Pel W and/or Wo Contrast  Result Date: 03/16/2020 CLINICAL DATA:  87 year old female with history of GI bleeding. EXAM: CTA ABDOMEN AND PELVIS WITHOUT AND WITH CONTRAST TECHNIQUE: Multidetector CT imaging of the abdomen and pelvis was performed using the standard protocol during bolus administration of intravenous contrast. Multiplanar reconstructed images and MIPs were obtained and reviewed to evaluate the vascular anatomy. CONTRAST:  74mL OMNIPAQUE IOHEXOL 350 MG/ML SOLN COMPARISON:  No priors. FINDINGS: VASCULAR Aorta: Normal caliber aorta without aneurysm, dissection, vasculitis or significant stenosis. Celiac: Patent without evidence of aneurysm, dissection, vasculitis or significant stenosis. SMA: Patent without evidence of aneurysm, dissection, vasculitis or significant stenosis. Renals: Both renal arteries are patent without evidence of aneurysm, dissection, vasculitis, fibromuscular dysplasia or significant stenosis. IMA: Patent without evidence of aneurysm, dissection, vasculitis or significant stenosis. Inflow: Patent without evidence of aneurysm, dissection, vasculitis or significant stenosis. Proximal Outflow: Bilateral common femoral and visualized portions of the superficial and profunda femoral arteries are patent without evidence of aneurysm, dissection, vasculitis or significant stenosis. Veins: No obvious venous abnormality within the limitations of this arterial phase study. Review of the MIP images confirms the above findings. NON-VASCULAR Lower chest:  Small hiatal hernia. Scattered areas of cylindrical bronchiectasis and thickening of the bronchovascular interstitium with regional architectural distortion are noted in the visualized lung bases, most severe in the right middle lobe. In the soft tissues of the right breast there is a 1.5 x 1.2 cm nodule (axial image 13 of series 10). Hepatobiliary: 1.7 cm low-attenuation lesion in segment 2 of the liver, compatible with a simple cyst. Subcentimeter low-attenuation lesions in segment 2 and 4A of the liver, incompletely characterized but statistically likely to represent a tiny cyst. No suspicious hepatic lesions. No intra or extrahepatic biliary ductal dilatation. Gallbladder is normal in appearance. Pancreas: No pancreatic mass. No pancreatic ductal dilatation. No pancreatic or peripancreatic fluid collections or inflammatory changes. Spleen: Unremarkable. Adrenals/Urinary Tract: 2.5 x 2.2 cm low-attenuation (9 HU) left adrenal nodule, compatible with an adenoma. Right adrenal gland and bilateral adrenal glands are normal in appearance. No hydroureteronephrosis. Urinary bladder is normal in appearance. Stomach/Bowel: The appearance of the stomach is normal. There is no pathologic dilatation of small bowel or colon. A few scattered colonic diverticulae are noted, without surrounding inflammatory changes to suggest an acute diverticulitis at this time. Normal appendix. Lymphatic: No lymphadenopathy noted in the abdomen or pelvis. Reproductive: Uterus and ovaries are atrophic. Other: No significant volume of ascites.  No pneumoperitoneum. Musculoskeletal: Fatty attenuation lesion in left proximal quadriceps musculature, most compatible with a lipoma, measuring up to 7.0 x 4.4 cm. Chronic appearing L2 compression fracture with 40% loss of anterior vertebral body height. There are no aggressive appearing lytic or blastic lesions noted in the visualized portions of the skeleton. IMPRESSION: VASCULAR 1. No acute vascular  abnormality in the abdomen or pelvis. Aortic atherosclerosis. NON-VASCULAR 1. No acute findings are noted in the abdomen or pelvis. 2. 1.5 x 1.2 cm right breast nodule, nonspecific. Correlation with physical examination and consideration for follow-up mammography if clinically appropriate is recommended. 3. Scattered areas of cylindrical bronchiectasis and post infectious scarring in the visualized lung bases. 4. Left adrenal adenoma. 5. Mild colonic diverticulosis without evidence of acute diverticulitis at this time. 6. Additional incidental findings, as  above. Electronically Signed   By: Vinnie Langton M.D.   On: 03/16/2020 18:45    Impression: Painless hematochezia in the setting of Eliquis and ASA use.  Most consistent with diverticular bleeding.  Diverticulosis on CT-A, no active bleeding seen.  Diverticulosis on prior colonoscopy in 2010, as well as one hyperplastic polyp. -Hemoglobin 10.0.  9.3 yesterday after 1u pRBCs.  Baseline of 13.8 as of 02/18/20. -Normal BUN/creatinine (22/0.75)  A fib, on Eliquis and ASA   Alzheimer's dementia   Plan: Recommend conservative management.   Clear liquid diet.  Hold Eliquis.  If no further bleeding in 48 hours and Hgb remains stable, OK to resume at that point.   Continue to monitor H&H with transfusion as needed to maintain Hgb >7.    LOS: 0 days   Salley Slaughter  PA-C 03/17/2020, 10:27 AM  Contact #  [phone]

## 2020-03-18 DIAGNOSIS — I10 Essential (primary) hypertension: Secondary | ICD-10-CM

## 2020-03-18 DIAGNOSIS — K5791 Diverticulosis of intestine, part unspecified, without perforation or abscess with bleeding: Secondary | ICD-10-CM

## 2020-03-18 LAB — CBC
HCT: 23.5 % — ABNORMAL LOW (ref 36.0–46.0)
HCT: 24.4 % — ABNORMAL LOW (ref 36.0–46.0)
Hemoglobin: 7.3 g/dL — ABNORMAL LOW (ref 12.0–15.0)
Hemoglobin: 7.6 g/dL — ABNORMAL LOW (ref 12.0–15.0)
MCH: 25.1 pg — ABNORMAL LOW (ref 26.0–34.0)
MCH: 25.9 pg — ABNORMAL LOW (ref 26.0–34.0)
MCHC: 31.1 g/dL (ref 30.0–36.0)
MCHC: 31.1 g/dL (ref 30.0–36.0)
MCV: 80.8 fL (ref 80.0–100.0)
MCV: 83.3 fL (ref 80.0–100.0)
Platelets: 200 10*3/uL (ref 150–400)
Platelets: 190 10*3/uL (ref 150–400)
RBC: 2.91 MIL/uL — ABNORMAL LOW (ref 3.87–5.11)
RBC: 2.93 MIL/uL — ABNORMAL LOW (ref 3.87–5.11)
RDW: 22.7 % — ABNORMAL HIGH (ref 11.5–15.5)
RDW: 22.4 % — ABNORMAL HIGH (ref 11.5–15.5)
WBC: 9.4 10*3/uL (ref 4.0–10.5)
WBC: 10.4 10*3/uL (ref 4.0–10.5)
nRBC: 0 % (ref 0.0–0.2)
nRBC: 0 % (ref 0.0–0.2)

## 2020-03-18 LAB — BASIC METABOLIC PANEL
Anion gap: 8 (ref 5–15)
BUN: 20 mg/dL (ref 8–23)
CO2: 21 mmol/L — ABNORMAL LOW (ref 22–32)
Calcium: 8 mg/dL — ABNORMAL LOW (ref 8.9–10.3)
Chloride: 113 mmol/L — ABNORMAL HIGH (ref 98–111)
Creatinine, Ser: 0.6 mg/dL (ref 0.44–1.00)
GFR, Estimated: >60 mL/min (ref >60)
Glucose, Bld: 105 mg/dL — ABNORMAL HIGH (ref 70–99)
Potassium: 3.3 mmol/L — ABNORMAL LOW (ref 3.5–5.1)
Sodium: 142 mmol/L (ref 135–145)

## 2020-03-18 MED ORDER — SODIUM CHLORIDE 0.9% IV SOLUTION: Freq: Once | INTRAVENOUS | Status: DC

## 2020-03-18 MED ORDER — DILTIAZEM HCL 30 MG PO TABS
30.0000 mg | ORAL_TABLET | Freq: Four times a day (QID) | ORAL | Status: DC
  Administered 2020-03-18 – 2020-03-19 (×5): 30 mg via ORAL
  Filled 2020-03-18 (×5): qty 1

## 2020-03-18 MED ORDER — POTASSIUM CHLORIDE CRYS ER 20 MEQ PO TBCR
40.0000 meq | EXTENDED_RELEASE_TABLET | Freq: Once | ORAL | Status: AC
  Administered 2020-03-18: 40 meq via ORAL
  Filled 2020-03-18: qty 2

## 2020-03-18 NOTE — Evaluation (Signed)
Physical Therapy Evaluation Patient Details Name: Melinda Edwards MRN: [id number] DOB: 1926/09/03 Today's Date: 03/18/2020   History of Present Illness  Patient is a 87 y/o female who presents with blood in stool most consistent with diverticular bleeding. Diverticulosis on CT-Abdomen. PMH includes CVA, HTN, Alzhemier's dementia, pulmonary fibrosis, A-fib.  Clinical Impression  Patient presents with generalized weakness, impaired balance, baseline dementia and impaired mobility s/p above. Pt lives in [facility] and has caregivers ~10 hours/day. Per POA, Pat, pt is able to dress/bathe self and uses RW for ambulation; walking to dining hall for 2 meals daily with supervision of caregivers. Today, pt is more confused than normal per friend, Fraser Din. Today, pt requires Mod A for bed mobility and standing transfers. Gait deferred due to HR up to 155 bpm max, A-fib, just sitting EOB/standing. If caregivers can provide 24/7, may be able to return to ALF pending they can care for her at this level. Would benefit from SNF to maximize independence and mobility prior to return home.     Follow Up Recommendations SNF;Supervision/Assistance - 24 hour    Equipment Recommendations  None recommended by PT    Recommendations for Other Services       Precautions / Restrictions Precautions Precautions: Fall;Other (comment) Precaution Comments: watch HR Restrictions Weight Bearing Restrictions: No      Mobility  Bed Mobility Overal bed mobility: Needs Assistance Bed Mobility: Supine to Sit;Sit to Supine     Supine to sit: Mod assist;HOB elevated Sit to supine: Min assist   General bed mobility comments: Assist to bring LEs to EOB and with trunk with step by step cues for sequencing.    Transfers Overall transfer level: Needs assistance Equipment used: 1 person hand held assist Transfers: Sit to/from Stand Sit to Stand: Mod assist         General transfer comment: Assist to power to standing  with cues for anterior weight shift; posterior LOB back onto bed, stood from EOB x3. HR up to 155 bpm max during activity; A-fib.  Ambulation/Gait             General Gait Details: Deferred due to HR and pt wanting to lay down  Stairs            Wheelchair Mobility    Modified Rankin (Stroke Patients Only)       Balance Overall balance assessment: Needs assistance Sitting-balance support: Feet supported;No upper extremity supported Sitting balance-Leahy Scale: Fair Sitting balance - Comments: close Min guard for safety.   Standing balance support: During functional activity Standing balance-Leahy Scale: Poor Standing balance comment: Requires UE support and external support in standing; LOB back onto bed                             Pertinent Vitals/Pain Pain Assessment: Faces Faces Pain Scale: No hurt    Home Living Family/patient expects to be discharged to::  ([facility])               Home Equipment: Gilford Rile - 2 wheels Additional Comments: Karie Schwalbe (over 50 years, very close) let her borrow her RW that Fraser Din does not use.    Prior Function Level of Independence: Independent with assistive device(s);Needs assistance   Gait / Transfers Assistance Needed: Uses RW for ambulation; walks to dining hall with caregivers for 2 meals/day. breakfast brought to her apt.  ADL's / Homemaking Assistance Needed: Able to do own ADLs per Fraser Din  however caregivers help more than they need too. has caregivers 10 hours/day.  Comments: Per Dennie Bible pt can bath herself (but someone is with her just in case), can dress herself, toilets herself, walks several times a day within the building, visits Pat in Independent living section of same building, makes her own bed, has a very set routine at [employer]   Dominant Hand: Right    Extremity/Trunk Assessment   Upper Extremity Assessment Upper Extremity Assessment: Defer to OT evaluation     Lower Extremity Assessment Lower Extremity Assessment: Generalized weakness    Cervical / Trunk Assessment Cervical / Trunk Assessment: Kyphotic  Communication   Communication: HOH  Cognition Arousal/Alertness: Awake/alert Behavior During Therapy: WFL for tasks assessed/performed Overall Cognitive Status: History of cognitive impairments - at baseline                                 General Comments: Knows she is at the hospital. Perseverating on brown shoes. Per Dennie Bible, her POA/friend,present in room, pt is more confused than normal today but seems to be clearing during session per report.      General Comments General comments (skin integrity, edema, etc.): Pat, [place] and friend present during session.    Exercises     Assessment/Plan    PT Assessment Patient needs continued PT services  PT Problem List Decreased strength;Decreased mobility;Decreased safety awareness;Decreased activity tolerance;Decreased cognition;Decreased balance;Cardiopulmonary status limiting activity       PT Treatment Interventions DME instruction;Gait training;Functional mobility training;Therapeutic activities;Patient/family education;Cognitive remediation;Balance training;Therapeutic exercise    PT Goals (Current goals can be found in the Care Plan section)  Acute Rehab PT Goals Patient Stated Goal: to get up and walk PT Goal Formulation: With patient Time For Goal Achievement: 04/01/20 Potential to Achieve Goals: Good    Frequency Min 3X/week   Barriers to discharge Decreased caregiver support has caregiver 10 hours/day    Co-evaluation               AM-PAC PT "6 Clicks" Mobility  Outcome Measure Help needed turning from your back to your side while in a flat bed without using bedrails?: A Little Help needed moving from lying on your back to sitting on the side of a flat bed without using bedrails?: A Lot Help needed moving to and from a bed to a chair (including a  wheelchair)?: A Lot Help needed standing up from a chair using your arms (e.g., wheelchair or bedside chair)?: A Lot Help needed to walk in hospital room?: A Lot Help needed climbing 3-5 steps with a railing? : Total 6 Click Score: 12    End of Session Equipment Utilized During Treatment: Gait belt Activity Tolerance: Treatment limited secondary to medical complications (Comment) (elevated HR, afib) Patient left: in bed;with call bell/phone within reach;with bed alarm set;with family/visitor present Nurse Communication: Mobility status PT Visit Diagnosis: Unsteadiness on feet (R26.81);Muscle weakness (generalized) (M62.81)    Time: 8185-6314 PT Time Calculation (min) (ACUTE ONLY): 21 min   Charges:   PT Evaluation $PT Eval Moderate Complexity: 1 Mod          Vale Haven, PT, DPT Acute Rehabilitation Services Pager [phone] Office [phone]      Blake Divine A Zafar Debrosse 03/18/2020, 1:19 PM

## 2020-03-18 NOTE — Progress Notes (Addendum)
Pt had a small bowel movement and it was bloody. Made 2 attempts to page  Family med's provider [facility] still awaiting response. Pt H&H is 7.3. The plan is to recheck H&H .

## 2020-03-18 NOTE — Plan of Care (Signed)

## 2020-03-18 NOTE — Progress Notes (Incomplete)
Pt HR was elevated to 135-172 for 10 minutes @ 0730 Maury Dus MD was notified and new orders were given refer to [facility].

## 2020-03-18 NOTE — Progress Notes (Signed)
[facility] Gastroenterology Progress Note  Melinda Edwards 87 y.o. 09-13-1926  CC:  Hematochezia  Subjective: Patient remains pleasantly confused.  Denies abdominal pain, nausea, or any complaints.  Per NT, patient has not had a BM today.  Last reported BM was last night.  ROS : Review of Systems  Unable to perform ROS: Dementia   Objective: Vital signs in last 24 hours: Vitals:   03/17/20 2300 03/18/20 0637  BP:  109/71  Pulse: 88 90  Resp: 20 20  Temp:  97.7 F (36.5 C)  SpO2: 98% 98%    Physical Exam:  General:  Pleasantly confused, awake but sleepy, no acute distress  Head:  Normocephalic, without obvious abnormality, atraumatic  Eyes:  Conjunctival pallor, EOMs intact  Lungs:   Clear to auscultation bilaterally, respirations unlabored  Heart:  Irregularly irregular rhythm (A fib), regular rate  Abdomen:   Soft, non-tender, non-distended, bowel sounds active all four quadrants,  no guarding or peritoneal signs  Extremities: Extremities normal, atraumatic, no  edema  Pulses: 2+ and symmetric    Lab Results: Recent Labs    03/17/20 0355 03/18/20 0338  NA 141 142  K 4.1 3.3*  CL 114* 113*  CO2 20* 21*  GLUCOSE 125* 105*  BUN 20 20  CREATININE 0.67 0.60  CALCIUM 8.4* 8.0*   Recent Labs    03/16/20 1341  AST 17  ALT 12  ALKPHOS 136*  BILITOT 0.4  PROT 6.4*  ALBUMIN 3.4*   Recent Labs    03/16/20 1341 03/16/20 1355 03/17/20 1417 03/17/20 2045 03/18/20 0338  WBC 6.9   < > 8.7  --  9.4  NEUTROABS 4.2  --   --   --   --   HGB 12.1   < > 8.6* 7.6* 7.3*  HCT 39.6   < > 27.0* 24.5* 23.5*  MCV 89.6   < > 81.8  --  80.8  PLT 256   < > 197  --  200   < > = values in this interval not displayed.   Recent Labs    03/16/20 1341  LABPROT 16.0*  INR 1.3*      Assessment: Painless hematochezia in the setting of Eliquis and ASA use.  Most consistent with diverticular bleeding. Diverticulosis on CT-A,no active bleeding seen. Diverticulosis on prior  colonoscopy in 2010, as well as one hyperplastic polyp. -Hemoglobin 7.3 today.  Yesterday, 10.0>8.6>7.6. -Normal BUN/creatinine (22/0.60)  A fib, on Eliquis and ASA  Alzheimer's dementia  Plan: Recommend conservative management. Patient would have difficulty finishing colonoscopy prep, given dementia. Thus, we will defer colonoscopy unless persistent bleeding occurs.  If destabilizing bleeding occurs, consider CT-A.  If patient remains clinically stable but has continued bleeding, consider nuclear bleeding scan.  Soft diet OK from a GI standpoint.  Hold Eliquis for another 48 hours.  Continue to monitor H&H with transfusion as needed to maintain Hgb >7.  [facility] will follow.  Edrick Kins PA-C 03/18/2020, 11:05 AM  Contact #  [phone]

## 2020-03-18 NOTE — Plan of Care (Signed)
  Problem: Education: Goal: Knowledge of General Education information will improve Description Including pain rating scale, medication(s)/side effects and non-pharmacologic comfort measures Outcome: Progressing   

## 2020-03-18 NOTE — Progress Notes (Signed)
FPTS Interim Progress Note  S: Sleeping comfortably.   O: BP (!) 94/51   Pulse 90   Temp 97.9 F (36.6 C) (Oral)   Resp (!) 21   SpO2 94%   General: Sleeping, no acute distress. Age appropriate. Respiratory: normal effort  A/P: PM check. VSS, pending manual read. Transfusion finishing up. Will follow up H/H. Spoke with lab Type & screen exp. 1/5 @ 1300.   Lavonda Jumbo, DO 03/18/2020, 9:18 PM PGY-2, [facility] Family Medicine Service pager [phone]

## 2020-03-18 NOTE — Progress Notes (Signed)
S: Went to evaluate the patient to see how her heart rate was doing.  Per the documentation heart rate is ranged from the 80s to the 120s or 130s throughout the afternoon.  Per our discussion in rounds earlier today the plan was to transfuse if the patient had a hemoglobin less than 7 or if her heart rate continued at 85% or greater of maximum estimated heart rate.  This would be approximately above 110 bpm for this patient.    Upon my evaluation of the patient her heart rate was approximately 128 bpm.  I discussed with the patient and her friend who was visiting her that I would like to give her another unit of blood which would likely help her feel better and improve her fast heart rate.  The friend stated that earlier when physical therapy was in the room they attempted to have the patient sit up and her heart rate jumped up dramatically which further suggest to me that this may be due to the patient's recent blood loss.    O: General: Pale appearing, lying in bed, no apparent distress Heart: Irregular rhythm while feeling the pulse, pulse approximately 128bpm. Lungs: No respiratory distress Skin: Warm and dry  A/P: Even though the patient is above the transfusion threshold of 7 per our discussion with the attending earlier today we will transfuse 1 unit of blood due to her ongoing tachycardia.

## 2020-03-18 NOTE — Progress Notes (Addendum)
Family Medicine Teaching Service Daily Progress Note Intern Pager: [phone]  Patient name: Melinda Edwards [place] Medical record number: [id number] Date of birth: 1926-06-30 Age: 87 y.o. Gender: female  Primary Care Provider: Lajean Manes, MD Consultants: GI Code Status: DNR  Pt Overview and Major Events to Date:  1/2: admitted, GI consulted, transfused 1u pRBCs  Assessment and Plan:  HAJRA PORT is a 87 y.o. female presenting with rectal bleeding. PMH is significant for a-fib, prior CVA, HTN, HLD, bronchiectasis, L2 compression fracture and Alzheimer's dementia.  GI Bleed Continues to have episodes of bloody BMs. Hgb 7.3 this morning (12.1>9.3>10.0>8.6>7.6). She is tachycardic this morning in the 120s-140s and intermittently in a-fib. BP has remained stable. -GI following, appreciate recommendations -Repeat CBC at 1300  -Transfusion threshold <7 OR if persistently tachycardic to >85% max HR -Continue Protonix 40mg  IV BID -Holding home Eliquis and ASA  Paroxysmal A-Fib Patient intermittently in a-fib this morning with HR 120s-140s after diltiazem was held yesterday. Home meds: Eliquis 5mg  BID and diltiazem 120mg  daily. CHADS2VASC score is 6. -Holding home Eliquis due to active bleed -Will restart diltiazem at 30mg  q6h today -Telemetry monitoring  Hypokalemia K 3.3 this morning -Replete with 40 mEq PO potassium  Recent L2 Compression Fracture Seen by sports med on 03/04/2020 and diagnosed with L2 compression fracture s/p fall earlier last month. Has been taking Ibuprofen and Tylenol for pain. -PT/OT eval when Hgb stable -Avoid Ibuprofen due to GI bleed -Can add Tylenol q6h prn if needed for pain  Hx of CVA No residual deficits. On ASA and Atorvastatin at home. -Continue Atorvastatin 10mg  daily -Holding ASA in the setting of acute bleed  Hx of HTN Patient with hx of HTN per chart review, but not on any home meds. BP has been wnl over past 24h.  -Monitor  BP  HLD Chronic, stable. Home meds: atorvastatin 10mg  daily -Continue home atorvastatin  Alzheimer's Dementia Oriented only to self and city at baseline.  -Delirium precautions  Mildly Elevated Alk Phos Alk Phos slightly elevated at 136 on admission. Was not previously elevated on prior admission in Feb 2021. -Consider repeat CMP as outpatient  Bronchiectasis  Chronic, stable. Patient stable on room air with SpO2 >95% and no dyspnea.   FEN/GI: Full liquid PPx: SCDs    Status is: Observation The patient will require care spanning > 2 midnights and should be moved to inpatient because: Ongoing diagnostic testing needed not appropriate for outpatient work up  Dispo:  Patient From: Other  Planned Disposition: Other  Expected discharge date: 03/19/2020  Medically stable for discharge: No    Subjective:  Patient with additional episode of bloody BM overnight. This morning she denies complaints.  Objective: Temp:  [97.6 F (36.4 C)-97.7 F (36.5 C)] 97.7 F (36.5 C) (01/04 0637) Pulse Rate:  [73-102] 90 (01/04 0637) Resp:  [14-28] 20 (01/04 0637) BP: (96-156)/(50-117) 109/71 (01/04 0637) SpO2:  [96 %-100 %] 98 % (01/04 8657) Physical Exam: General: very pleasant, alert, NAD Cardiovascular: tachycardic, irregularly irregular rhythm Respiratory: normal WOB, lungs CTAB Abdomen: soft, nontender, nondistended Extremities: scattered ecchymosis on bilateral lower extremities Neuro: alert, oriented to self and place. CN II-XII intact. 5/5 strength in bilateral upper and lower extremities  Laboratory: Recent Labs  Lab 03/17/20 0355 03/17/20 1417 03/17/20 2045 03/18/20 0338  WBC 9.8 8.7  --  9.4  HGB 10.0* 8.6* 7.6* 7.3*  HCT 33.2* 27.0* 24.5* 23.5*  PLT 229 197  --  200   Recent Labs  Lab 03/16/20  1341 03/16/20 1355 03/17/20 0355 03/18/20 0338  NA 140 144 141 142  K 4.2 4.3 4.1 3.3*  CL 112* 110 114* 113*  CO2 22  --  20* 21*  BUN 22 26* 20 20  CREATININE  0.75 0.60 0.67 0.60  CALCIUM 8.8*  --  8.4* 8.0*  PROT 6.4*  --   --   --   BILITOT 0.4  --   --   --   ALKPHOS 136*  --   --   --   ALT 12  --   --   --   AST 17  --   --   --   GLUCOSE 125* 114* 125* 105*     Imaging/Diagnostic Tests: No new imaging/diagnostic tests in last 24h.    Alcus Dad, MD 03/18/2020, 9:27 AM PGY-1, [place] Intern pager: [phone], text pages welcome

## 2020-03-18 NOTE — Plan of Care (Signed)
  Problem: Health Behavior/Discharge Planning: Goal: Ability to manage health-related needs will improve Outcome: Progressing   Problem: Clinical Measurements: Goal: Ability to maintain clinical measurements within normal limits will improve Outcome: Progressing Goal: Will remain free from infection Outcome: Progressing Goal: Diagnostic test results will improve Outcome: Progressing Goal: Respiratory complications will improve Outcome: Progressing Goal: Cardiovascular complication will be avoided Outcome: Progressing   Problem: Nutrition: Goal: Adequate nutrition will be maintained Outcome: Progressing   Problem: Elimination: Goal: Will not experience complications related to bowel motility Outcome: Progressing Goal: Will not experience complications related to urinary retention Outcome: Progressing   Problem: Pain Managment: Goal: General experience of comfort will improve Outcome: Progressing   Problem: Skin Integrity: Goal: Risk for impaired skin integrity will decrease Outcome: Progressing

## 2020-03-19 LAB — BASIC METABOLIC PANEL
Anion gap: 8 (ref 5–15)
BUN: 16 mg/dL (ref 8–23)
CO2: 20 mmol/L — ABNORMAL LOW (ref 22–32)
Calcium: 7.8 mg/dL — ABNORMAL LOW (ref 8.9–10.3)
Chloride: 112 mmol/L — ABNORMAL HIGH (ref 98–111)
Creatinine, Ser: 0.57 mg/dL (ref 0.44–1.00)
GFR, Estimated: >60 mL/min (ref >60)
Glucose, Bld: 97 mg/dL (ref 70–99)
Potassium: 3.4 mmol/L — ABNORMAL LOW (ref 3.5–5.1)
Sodium: 140 mmol/L (ref 135–145)

## 2020-03-19 LAB — CBC
HCT: 24.6 % — ABNORMAL LOW (ref 36.0–46.0)
Hemoglobin: 8.2 g/dL — ABNORMAL LOW (ref 12.0–15.0)
MCH: 27.7 pg (ref 26.0–34.0)
MCHC: 33.3 g/dL (ref 30.0–36.0)
MCV: 83.1 fL (ref 80.0–100.0)
Platelets: 163 10*3/uL (ref 150–400)
RBC: 2.96 MIL/uL — ABNORMAL LOW (ref 3.87–5.11)
RDW: 20.1 % — ABNORMAL HIGH (ref 11.5–15.5)
WBC: 7.8 10*3/uL (ref 4.0–10.5)
nRBC: 0.3 % — ABNORMAL HIGH (ref 0.0–0.2)

## 2020-03-19 LAB — TYPE AND SCREEN
ABO/RH(D): B POS
Antibody Screen: NEGATIVE

## 2020-03-19 LAB — PREPARE RBC (CROSSMATCH)

## 2020-03-19 MED ORDER — POTASSIUM CHLORIDE CRYS ER 20 MEQ PO TBCR
40.0000 meq | EXTENDED_RELEASE_TABLET | Freq: Two times a day (BID) | ORAL | Status: AC
  Administered 2020-03-19 (×2): 40 meq via ORAL
  Filled 2020-03-19 (×2): qty 2

## 2020-03-19 MED ORDER — DILTIAZEM HCL ER COATED BEADS 120 MG PO CP24
120.0000 mg | ORAL_CAPSULE | Freq: Every day | ORAL | Status: DC
  Administered 2020-03-19 – 2020-03-21 (×3): 120 mg via ORAL
  Filled 2020-03-19 (×3): qty 1

## 2020-03-19 MED ORDER — PANTOPRAZOLE SODIUM 40 MG PO TBEC
40.0000 mg | DELAYED_RELEASE_TABLET | Freq: Two times a day (BID) | ORAL | Status: DC
  Administered 2020-03-19 – 2020-03-20 (×2): 40 mg via ORAL
  Filled 2020-03-19 (×2): qty 1

## 2020-03-19 NOTE — Progress Notes (Signed)
OT Cancellation Note  Patient Details Name: Melinda Edwards MRN: [id number] DOB: [birth year]   Cancelled Treatment:    Reason Eval/Treat Not Completed: Patient declined, no reason specified (Pt declined bed mobility and ambulation. Pt stating "I don't have to." OTR attempting to describe the benefits or OT eval, but pt closed her eyes and said "I'm not moving." OTR to attempt later today for mobility/ADL needs for d/c.)   Flora Lipps, OTR/L Acute Rehabilitation Services Pager: [phone] Office: [phone]   Fiorella Hanahan C 03/19/2020, 10:35 AM

## 2020-03-19 NOTE — NC FL2 (Signed)
[place] MEDICAID FL2 LEVEL OF CARE SCREENING TOOL     IDENTIFICATION  Patient Name: Melinda Edwards Birthdate: [birth year] Sex: female Admission Date (Current Location): 03/16/2020  [facility] and [place] Number:  Producer, television/film/video and Address:  [facility] [place], [address] [place], [place] [address]      Provider Number: [id number]  Attending Physician Name and Address:  Nestor Ramp, MD  Relative Name and Phone Number:  Darlyn Read [phone]  [phone]    Current Level of Care: Hospital Recommended Level of Care: Skilled Nursing Facility Prior Approval Number:    Date Approved/Denied:   PASRR Number:    Discharge Plan: SNF    Current Diagnoses: Patient Active Problem List   Diagnosis Date Noted  . Hemoglobin decreased 03/17/2020  . Lumbar L2 compression fracture, with routine healing, subsequent encounter 03/17/2020  . Alzheimer's dementia (HCC)   . GI bleed 03/16/2020  . Paroxysmal atrial fibrillation (HCC) 05/14/2019  . CVA (cerebral vascular accident) (HCC) 05/10/2019  . Essential hypertension 05/10/2019  . Aortic stenosis, mild 04/13/2011  . Benign hypertensive heart disease without heart failure 08/13/2010  . Hyperlipidemia 08/13/2010  . Bronchiectasis without acute exacerbation (HCC) 11/15/2007    Orientation RESPIRATION BLADDER Height & Weight     Self,Place  Normal Continent Weight:   Height:     BEHAVIORAL SYMPTOMS/MOOD NEUROLOGICAL BOWEL NUTRITION STATUS      Continent Diet (soft diet. see discharge summary)  AMBULATORY STATUS COMMUNICATION OF NEEDS Skin   Total Care Verbally Normal                       Personal Care Assistance Level of Assistance  Bathing,Feeding,Dressing Bathing Assistance: Maximum assistance Feeding assistance: Limited assistance Dressing Assistance: Maximum assistance     Functional Limitations Info  Sight,Hearing,Speech Sight Info: Adequate Hearing Info: Adequate Speech  Info: Adequate    SPECIAL CARE FACTORS FREQUENCY  PT (By licensed PT),OT (By licensed OT)     PT Frequency: 5x week OT Frequency: 5x week            Contractures Contractures Info: Not present    Additional Factors Info  Code Status,Allergies Code Status Info: DNR Allergies Info: Antihistamines, Loratadine-type, Loratadine, Namenda           Current Medications (03/19/2020):  This is the current hospital active medication list Current Facility-Administered Medications  Medication Dose Route Frequency Provider Last Rate Last Admin  . 0.9 %  sodium chloride infusion (Manually program via Guardrails IV Fluids)   Intravenous Once Tilden Fossa, MD   Held at 03/16/20 2232  . 0.9 %  sodium chloride infusion (Manually program via Guardrails IV Fluids)   Intravenous Once Maury Dus, MD   Stopped at 03/18/20 2154  . acetaminophen (TYLENOL) tablet 650 mg  650 mg Oral Q6H PRN Jackelyn Poling, DO       Or  . acetaminophen (TYLENOL) suppository 650 mg  650 mg Rectal Q6H PRN Jackelyn Poling, DO      . atorvastatin (LIPITOR) tablet 10 mg  10 mg Oral Daily Welborn, Ryan, DO   10 mg at 03/19/20 6606  . diltiazem (CARDIZEM CD) 24 hr capsule 120 mg  120 mg Oral Daily Maness, Philip, MD      . pantoprazole (PROTONIX) EC tablet 40 mg  40 mg Oral BID Alphia Moh D, RPH      . polyethylene glycol (MIRALAX / GLYCOLAX) packet 17 g  17 g Oral  Daily PRN Lurline Del, DO      . potassium chloride SA (KLOR-CON) CR tablet 40 mEq  40 mEq Oral BID Alcus Dad, MD         Discharge Medications: Please see discharge summary for a list of discharge medications.  Relevant Imaging Results:  Relevant Lab Results:   Additional Information SSN [id number]  Joanne Chars, LCSW

## 2020-03-19 NOTE — Progress Notes (Signed)
Family Medicine Teaching Service Daily Progress Note Intern Pager: [phone]  Patient name: Melinda Edwards Medical record number: [id number] Date of birth: 03/03/1927 Age: 87 y.o. Gender: female  Primary Care Provider: Merlene Laughter, MD Consultants: GI Code Status: DNR  Pt Overview and Major Events to Date:  1/2: admitted, GI consulted, transfused 1u pRBCs 1/4: transfused additional 1u pRBCs  Assessment and Plan:  Melinda Edwards is a 87 y.o. female presenting with rectal bleeding. PMH is significant for a-fib, prior CVA, HLD, bronchiectasis, L2 compression fracture and Alzheimer's dementia.  GI Bleed No further episodes of bloody BM over past 24h. Hgb 8.2 this morning after 1u pRBCs yesterday evening (12.1>9.3>1u>10.0>8.6>7.6>7.3>7.6>1u>8.2). Vitals stable and improved s/p transfusion (HR 70s-80s), BP 100/76 this morning. -GI following, appreciate recommendations -1700 H/H -Transfusion threshold <7 OR if tachycardic to >85% max HR -Continue Protonix 40mg  IV BID -Holding home Eliquis and ASA -Consider resuming Eliquis and ASA after 48h without bleeding  Paroxysmal A-Fib Patient in sinus rhythm this morning with HR 70s. Home meds: Eliquis 5mg  BID and diltiazem 120mg  daily. CHADS2VASC score is 6. -Holding home Eliquis due to active bleed -Restart Diltiazem at home dose (120mg  daily)- had been giving 30mg  q6h yesterday -Telemetry monitoring  Hypokalemia K 3.4 this morning. -Replete with 40 mEq PO potassium x2 -Daily BMP  Recent L2 Compression Fracture Seen by sports med on 03/04/2020 and diagnosed with L2 compression fracture s/p fall earlier last month. Has been taking Ibuprofen and Tylenol for pain. -Ongoing PT/OT, they have recommended SNF -Avoid Ibuprofen due to GI bleed -Can add Tylenol q6h prn if needed for pain  Hx of CVA No residual deficits. On ASA and Atorvastatin at home. -Continue Atorvastatin 10mg  daily -Holding ASA in the setting of acute  bleed  HLD Chronic, stable. Home meds: atorvastatin 10mg  daily -Continue home atorvastatin  Alzheimer's Dementia Oriented only to self and city at baseline.  -Delirium precautions  Bronchiectasis  Chronic, stable. Patient stable on room air with SpO2 >95% and no dyspnea.   FEN/GI: Soft diet PPx: SCDs    Status is: Inpatient Remains inpatient appropriate because: Hemodynamically unstable and Ongoing diagnostic testing needed not appropriate for outpatient work up  Dispo:  Patient From: SNF  Planned Disposition: SNF  Expected discharge date: 03/20/2020  Medically stable for discharge: No    Subjective:  No acute events overnight. No further episodes of bleeding since yesterday morning ~5am. Patient sleeping comfortably this morning without complaints.  Objective: Temp:  [97.7 F (36.5 C)-98.8 F (37.1 C)] 98.8 F (37.1 C) (01/05 0316) Pulse Rate:  [78-114] 87 (01/05 0316) Resp:  [18-27] 18 (01/05 0316) BP: (89-110)/(51-71) 108/58 (01/05 0316) SpO2:  [90 %-98 %] 96 % (01/05 0316) Physical Exam: General: very pleasant, NAD Cardiovascular: RRR, normal S1/S2 Respiratory: normal WOB, lungs CTAB Abdomen: soft, nontender, nondistended Extremities: scattered ecchymosis on bilateral lower extremities Neuro: alert, oriented to self  Laboratory: Recent Labs  Lab 03/18/20 0338 03/18/20 1229 03/19/20 0420  WBC 9.4 10.4 7.8  HGB 7.3* 7.6* 8.2*  HCT 23.5* 24.4* 24.6*  PLT 200 190 163   Recent Labs  Lab 03/16/20 1341 03/16/20 1355 03/17/20 0355 03/18/20 0338 03/19/20 0420  NA 140   < > 141 142 140  K 4.2   < > 4.1 3.3* 3.4*  CL 112*   < > 114* 113* 112*  CO2 22  --  20* 21* 20*  BUN 22   < > 20 20 16   CREATININE 0.75   < > 0.67  0.60 0.57  CALCIUM 8.8*  --  8.4* 8.0* 7.8*  PROT 6.4*  --   --   --   --   BILITOT 0.4  --   --   --   --   ALKPHOS 136*  --   --   --   --   ALT 12  --   --   --   --   AST 17  --   --   --   --   GLUCOSE 125*   < > 125* 105* 97    < > = values in this interval not displayed.     Imaging/Diagnostic Tests: No new imaging/diagnostic tests in last 24h.    Alcus Dad, MD 03/19/2020, 6:06 AM PGY-1, [place] Intern pager: [phone], text pages welcome

## 2020-03-19 NOTE — Progress Notes (Signed)
    RE:  [employer] Null       Date of Birth:  02/15/1927     Date:   03/19/20       To Whom It May Concern:  Please be advised that the above-named patient will require a short-term nursing home stay - anticipated 30 days or less for rehabilitation and strengthening.  The plan is for return home.                 MD signature                Date

## 2020-03-19 NOTE — Progress Notes (Signed)
[facility] Gastroenterology Progress Note  Melinda Edwards 87 y.o. 13-Dec-1926  CC: Rectal bleeding, A. fib on Eliquis   Subjective: Patient seen and examined at bedside.  Yesterday's events noted.  Patient had tachycardia and received 1 unit of blood transfusion.  Discussed with RN at bedside.  Denied any bleeding episodes yesterday or overnight.  ROS : Unable to obtain   Objective: Vital signs in last 24 hours: Vitals:   03/19/20 0316 03/19/20 0812  BP: (!) 108/58 100/76  Pulse: 87 72  Resp: 18 20  Temp: 98.8 F (37.1 C)   SpO2: 96% 96%    Physical Exam:  General:   Elderly patient, patient, no acute distress noted  Head:  Normocephalic, without obvious abnormality, atraumatic  Eyes:  , EOM's intact,   Lungs:    No visible respiratory distress  Heart:  Regular rate and rhythm, S1, S2 normal  Abdomen:   Soft, non-tender, nondistended, bowel sounds present, no peritoneal signs  Psych  mood and affect normal       Lab Results: Recent Labs    03/18/20 0338 03/19/20 0420  NA 142 140  K 3.3* 3.4*  CL 113* 112*  CO2 21* 20*  GLUCOSE 105* 97  BUN 20 16  CREATININE 0.60 0.57  CALCIUM 8.0* 7.8*   Recent Labs    03/16/20 1341  AST 17  ALT 12  ALKPHOS 136*  BILITOT 0.4  PROT 6.4*  ALBUMIN 3.4*   Recent Labs    03/16/20 1341 03/16/20 1355 03/18/20 1229 03/19/20 0420  WBC 6.9   < > 10.4 7.8  NEUTROABS 4.2  --   --   --   HGB 12.1   < > 7.6* 8.2*  HCT 39.6   < > 24.4* 24.6*  MCV 89.6   < > 83.3 83.1  PLT 256   < > 190 163   < > = values in this interval not displayed.   Recent Labs    03/16/20 1341  LABPROT 16.0*  INR 1.3*      Assessment/Plan: Assessment  ----------------  -Painless rectal bleeding in setting of Eliquis and aspirin use. Most likely diverticular bleed versus hemorrhoid. CTA negative for active bleeding.  -Acute blood loss anemia.  Received 1 unit of PRBC 03/18/2020.  -Alzheimer's dementia  -History of A. fib. Eliquis on hold     Recommendations  -------------------------  -Discussed with RN.  No bleeding episodes in  last 24 hours. -Okay to resume Eliquis tomorrow if hemoglobin stable and no further bleeding episodes. -Advance diet as tolerated -GI will sign off.  Call us back if needed   Kathi Der MD, FACP 03/19/2020, 8:48 AM  Contact #  [phone]

## 2020-03-19 NOTE — TOC Initial Note (Signed)
Transition of Care [facility]) - Initial/Assessment Note    Patient Details  Name: INDRA WOLTERS MRN: [id number] Date of Birth: Oct 08, 1926  [facility] CM/SW Contact:    Lorri Frederick, LCSW Phone Number: 03/19/2020, 12:54 PM  Clinical Narrative:    CSW met with pt and with Earley Brooke, friend/POA to discuss discharge plan.  Pt pleasantly confused.  Earley Brooke reports pt lives at [facility] in [place], currently has [facility] aide in the home 10 hours per day/7 days per week.  Pat lives in different apartment in same facility.  Pt is vaccinated for covid.  Discussed recommendation for SNF and Dennie Bible would prefer receiving PT services at [facility] if possible.  Dennie Bible reports that she is able to increase HH aide to 24/7 to facilitate pt return home.    Discussed SNF as back up plan, choice document provided.    CSW spoke with Deanna Artis at [place] (cell: [phone])  PT services available through the facility and also through [facility], 3x week would be max.  They are able to receive pt back as soon as she is ready.    CSW spoke with Earley Brooke again and she had also spoken to [facility] who had informed her that further PT services could be self paid by the family if needed.  Dennie Bible would like to plan on return to [place].               Expected Discharge Plan: Skilled Nursing Facility Barriers to Discharge: Continued Medical Work up,SNF Pending bed offer   Patient Goals and CMS Choice   CMS Medicare.gov Compare Post Acute Care list provided to:: Patient Represenative (must comment) Choice offered to / list presented to : [facility] POA / Guardian  Expected Discharge Plan and Services Expected Discharge Plan: Skilled Nursing Facility     Post Acute Care Choice: Resumption of Svcs/PTA Provider Living arrangements for the past 2 months: Assisted Living Facility                                      Prior Living Arrangements/Services Living arrangements for the past 2 months: Assisted Living  Facility Lives with:: Facility Resident Patient language and need for interpreter reviewed:: Yes        Need for Family Participation in Patient Care: Yes (Comment) Care giver support system in place?: Yes (comment) (friend Dennie Bible is POA) 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 Hemoglobin decreased 03/17/2020  . Lumbar L2 compression fracture, with routine healing, subsequent encounter 03/17/2020  . Alzheimer's dementia (HCC)   . GI bleed 03/16/2020  . Paroxysmal atrial fibrillation (HCC) 05/14/2019  . CVA (cerebral vascular accident) (HCC) 05/10/2019  . Essential hypertension 05/10/2019  . Aortic stenosis, mild 04/13/2011  . Benign hypertensive heart disease without heart failure 08/13/2010  . Hyperlipidemia 08/13/2010  . Bronchiectasis without acute exacerbation (HCC) 11/15/2007   PCP:  Pete Glatter,  Christiane Ha, MD Pharmacy:   [facility] - [address] [place] [address] Phone: [phone] Fax: [place], [address] [place] [address] Phone: [phone] Fax: [phone]     Social Determinants of Health (SDOH) Interventions    Readmission Risk Interventions No flowsheet data found.

## 2020-03-19 NOTE — Evaluation (Signed)
Occupational Therapy Evaluation Patient Details Name: Melinda Edwards MRN: [id number] DOB: 1926/07/24 Today's Date: 03/19/2020    History of Present Illness Patient is a 87 y/o female who presents with blood in stool most consistent with diverticular bleeding. Diverticulosis on CT-Abdomen. PMH includes CVA, HTN, Alzhemier's dementia, pulmonary fibrosis, A-fib.   Clinical Impression   Pt PTA: Pt ws living at ALF with 10hrs of CG assist and can be increased to 24/7 if needed. Pt was assisted with all ADL/iADL. Pt currently limited by decreased strength, decreased activity tolerance and decreased motivation and ability to care for self. Pt's friend Fraser Din in room was helpful and encouraging. Pt with vision impairment peripherally on R side from previous CVA. Set-upA to maxA for ADL and miNA overall for mobility with RW. Pt would benefit from continued OT skilled services. OT following acutely.  HR remained 90-105 BPM throughout session.    Follow Up Recommendations  Home health OT;Supervision/Assistance - 24 hour    Equipment Recommendations  3 in 1 bedside commode    Recommendations for Other Services       Precautions / Restrictions Precautions Precautions: Fall;Other (comment) Precaution Comments: watch HR Restrictions Weight Bearing Restrictions: No      Mobility Bed Mobility Overal bed mobility: Needs Assistance Bed Mobility: Supine to Sit;Sit to Supine     Supine to sit: Min assist;HOB elevated Sit to supine: Supervision   General bed mobility comments: minA for trunk elevation and pt putting self back into bed    Transfers Overall transfer level: Needs assistance Equipment used: Rolling walker (2 wheeled) Transfers: Sit to/from [facility] Sit to Stand: Min assist Stand pivot transfers: Min assist       General transfer comment: minA for safety to avoid plopping and for proper hand placement    Balance Overall balance assessment: Needs  assistance Sitting-balance support: Feet supported;No upper extremity supported Sitting balance-Leahy Scale: Fair     Standing balance support: During functional activity Standing balance-Leahy Scale: Poor Standing balance comment: Pt requiring BUE external support in standing at [facility] so requiring assist for pericare                           ADL either performed or assessed with clinical judgement   ADL Overall ADL's : Needs assistance/impaired Eating/Feeding: Set up;Sitting   Grooming: Min guard;Standing   Upper Body Bathing: Minimal assistance;Sitting   Lower Body Bathing: Minimal assistance;Sit to/from stand   Upper Body Dressing : Minimal assistance;Sitting   Lower Body Dressing: Minimal assistance;Sitting/lateral leans;Sit to/from stand   Toilet Transfer: Minimal assistance;Ambulation;RW   Toileting- Clothing Manipulation and Hygiene: Minimal assistance;Sitting/lateral lean;Sit to/from stand       Functional mobility during ADLs: Minimal assistance;Rolling walker (avoiding obstacles on R side 50% of time) General ADL Comments: Pt limited by decreased strength, decreased activity tolerance and decreased motivation and ability to care for self. Pt's friend Fraser Din in room was helpful and encouraging.     Vision Patient Visual Report: No change from baseline;Peripheral vision impairment (Pt with vision impairment peripherally on R side from previous CVA.) Vision Assessment?: Vision impaired- to be further tested in functional context     Perception     Praxis      Pertinent Vitals/Pain Pain Assessment: Faces Faces Pain Scale: No hurt Pain Intervention(s): Monitored during session     Hand Dominance Right   Extremity/Trunk Assessment Upper Extremity Assessment Upper Extremity Assessment: Generalized weakness   Lower Extremity  Assessment Lower Extremity Assessment: Generalized weakness   Cervical / Trunk Assessment Cervical / Trunk Assessment:  Kyphotic   Communication Communication Communication: HOH   Cognition Arousal/Alertness: Awake/alert Behavior During Therapy: WFL for tasks assessed/performed Overall Cognitive Status: History of cognitive impairments - at baseline                                 General Comments: Pt aware of location; stating "I do not know"  throughout session when asked for age and situation. Pt's friend Fraser Din present to decipher that pt is usually not confused. Fraser Din also states that pt is sleeping a lot more here.   General Comments  Fraser Din present for session; Pt ambulating 26' with RW and minA for stability and to avoid obstacles on R side.    Exercises     Shoulder Instructions      Home Living Family/patient expects to be discharged to:: Assisted living                             Home Equipment: Gilford Rile - 2 wheels   Additional Comments: Karie Schwalbe (over 50 years, very close) let her borrow her RW that Fraser Din does not use.      Prior Functioning/Environment Level of Independence: Needs assistance;Independent with assistive device(s)  Gait / Transfers Assistance Needed: Uses RW for ambulation; walks to dining hall with caregivers for 2 meals/day. breakfast brought to her apt. ADL's / Homemaking Assistance Needed: Able to do own ADLs per Fraser Din however caregivers help more than they need o. has caregivers 10 hours/day.   Comments: obtained from friend, Fraser Din.Pt is HOH and not able to answer questions.        OT Problem List: Decreased strength;Decreased activity tolerance;Impaired balance (sitting and/or standing);Decreased safety awareness;Pain;Decreased cognition;Cardiopulmonary status limiting activity      OT Treatment/Interventions: Self-care/ADL training;Therapeutic exercise;Energy conservation;DME and/or AE instruction;Therapeutic activities;Cognitive remediation/compensation;Patient/family education;Balance training;Visual/perceptual remediation/compensation    OT  Goals(Current goals can be found in the care plan section) Acute Rehab OT Goals Patient Stated Goal: to go home with therapy OT Goal Formulation: With family Time For Goal Achievement: 04/02/20 Potential to Achieve Goals: Good ADL Goals Pt Will Perform Grooming: with supervision;standing Pt Will Perform Lower Body Dressing: with min guard assist;sit to/from stand Pt Will Transfer to Toilet: with min guard assist;ambulating  OT Frequency: Min 2X/week   Barriers to D/C:            Co-evaluation              AM-PAC OT "6 Clicks" Daily Activity     Outcome Measure Help from another person eating meals?: A Little Help from another person taking care of personal grooming?: A Little Help from another person toileting, which includes using toliet, bedpan, or urinal?: A Lot Help from another person bathing (including washing, rinsing, drying)?: A Lot Help from another person to put on and taking off regular upper body clothing?: A Little Help from another person to put on and taking off regular lower body clothing?: A Lot 6 Click Score: 15   End of Session Equipment Utilized During Treatment: Gait belt;Rolling walker Nurse Communication: Mobility status  Activity Tolerance: Patient tolerated treatment well Patient left: in bed;with call bell/phone within reach;with bed alarm set;with family/visitor present  OT Visit Diagnosis: Unsteadiness on feet (R26.81);Muscle weakness (generalized) (M62.81)  Time: 1975-8832 OT Time Calculation (min): 38 min Charges:  OT General Charges $OT Visit: 1 Visit OT Evaluation $OT Eval Moderate Complexity: 1 Mod OT Treatments $Self Care/Home Management : 8-22 mins $Therapeutic Activity: 8-22 mins  Flora Lipps, OTR/L Acute Rehabilitation Services Pager: [phone] Office: [phone]   Fern Asmar C 03/19/2020, 5:28 PM

## 2020-03-20 LAB — CBC
HCT: 28.1 % — ABNORMAL LOW (ref 36.0–46.0)
Hemoglobin: 9 g/dL — ABNORMAL LOW (ref 12.0–15.0)
MCH: 26.8 pg (ref 26.0–34.0)
MCHC: 32 g/dL (ref 30.0–36.0)
MCV: 83.6 fL (ref 80.0–100.0)
Platelets: 185 10*3/uL (ref 150–400)
RBC: 3.36 MIL/uL — ABNORMAL LOW (ref 3.87–5.11)
RDW: 20.1 % — ABNORMAL HIGH (ref 11.5–15.5)
WBC: 8.5 10*3/uL (ref 4.0–10.5)
nRBC: 0.2 % (ref 0.0–0.2)

## 2020-03-20 LAB — TYPE AND SCREEN
ABO/RH(D): B POS
Antibody Screen: NEGATIVE
Unit division: 0
Unit division: 0
Unit division: 0

## 2020-03-20 LAB — BPAM RBC
Blood Product Expiration Date: 202201232359
Blood Product Expiration Date: 202201252359
Blood Product Expiration Date: 202201252359
ISSUE DATE / TIME: 202201022154
ISSUE DATE / TIME: 202201041811
Unit Type and Rh: 7300
Unit Type and Rh: 7300
Unit Type and Rh: 7300

## 2020-03-20 LAB — BASIC METABOLIC PANEL
Anion gap: 11 (ref 5–15)
BUN: 12 mg/dL (ref 8–23)
CO2: 22 mmol/L (ref 22–32)
Calcium: 8.1 mg/dL — ABNORMAL LOW (ref 8.9–10.3)
Chloride: 106 mmol/L (ref 98–111)
Creatinine, Ser: 0.56 mg/dL (ref 0.44–1.00)
GFR, Estimated: >60 mL/min (ref >60)
Glucose, Bld: 95 mg/dL (ref 70–99)
Potassium: 3.7 mmol/L (ref 3.5–5.1)
Sodium: 139 mmol/L (ref 135–145)

## 2020-03-20 LAB — SARS CORONAVIRUS 2 (TAT 6-24 HRS): SARS Coronavirus 2: NEGATIVE

## 2020-03-20 MED ORDER — APIXABAN 5 MG PO TABS
5.0000 mg | ORAL_TABLET | Freq: Two times a day (BID) | ORAL | Status: DC
  Filled 2020-03-20: qty 1

## 2020-03-20 NOTE — Discharge Summary (Addendum)
[facility] Discharge Summary  Patient name: Melinda Edwards Guest Medical record number: [id number] Date of birth: [birth year] Age: 87 y.o. Gender: female Date of Admission: 03/16/2020  Date of Discharge: 03/21/20 Admitting Physician: Dickie La, MD  Primary Care Provider: Lajean Manes, MD Consultants: GI  Indication for Hospitalization: GI Bleed  Discharge Diagnoses/Problem List:  GI Bleed Paroxysmal A-Fib Hx of CVA Hx of L2 compression fracture HLD Alzheimer's Dementia Bronchiectasis  Disposition: SNF  Discharge Condition: Improved, stable  Discharge Exam:  General: very pleasant, NAD Cardiovascular: RRR, normal S1/S2 Respiratory: normal WOB, lungs CTAB Abdomen: soft, nontender, nondistended Extremities: scattered ecchymosis on bilateral upper and lower extremities Neuro: alert, oriented to self and place  Brief Hospital Course:  Melinda Edwards is a 87 y.o. female who presented with bright red blood per rectum. PMH is significant for a-fib, prior CVA, HTN, HLD, bronchiectasis, L2 compression fracture and Alzheimer's dementia.  GI bleed Patient presented with several large episodes of bright red blood per rectum. CTA abdomen/pelvis was unremarkable other than mild diverticulosis. GI followed the patient and recommended medical management with monitoring of CBC and transfusing as needed (POA did not want invasive procedures). They ultimately thought the etiology was diverticular bleed vs. Hemorrhoids. She was transfused 2u pRBCs during her admission and H/H was trended until it was stable. She continued to have bloody BMs, but her hemoglobin remained stable at 9.2 on day of discharge. We elected NOT to re-start her Eliquis or ASA. If patient has no further bleeding for 48-72 hours after discharge, it would be reasonable to re-start ASA $RemoveBefo'81mg'KRBcpjPtcUe$  daily. We do not recommend re-starting Eliquis at this time, per discussion with patient's POA.  A-Fib Patient  with a hx of a-fib, on Eliquis and Diltiazem at home. Her Eliquis was discontinued due to active bleeding. Her cardizem was held for one day (1/3) in order to see physiologic response to anemia and her HR subsequently increased >120, so it was re-started on 1/4.  Hx of CVA Patient's ASA was held due to GI bleed. After lengthy discussion with POA we continued to hold ASA on discharge, but it would be reasonable to re-start if she does not have further bleeding at home.  The remainder of patient's chronic medical problems remained stable throughout admission and she was maintained on her home medications.   Issues for Follow Up:  -Recommend checking H/H in 2-3 days. If patient continues to bleed, can consider weekly H/H (or CBC) thereafter -If patient's bleeding resolves for at least 48-72 hours, can consider re-starting ASA $RemoveBeforeD'81mg'zOPiNDYotReQAU$  daily -CT angio showed: 1.5 x 1.2 cm right breast nodule, nonspecific. Correlation with physical examination and consideration for follow-up mammography if clinically appropriate is recommended. -CT angio also showed left adrenal adenoma -Consider repeat CMP (alk phos elevated on admission)  Significant Procedures: None  Significant Labs and Imaging:  Recent Labs  Lab 03/19/20 0420 03/20/20 0309 03/21/20 0105  WBC 7.8 8.5 7.2  HGB 8.2* 9.0* 9.2*  HCT 24.6* 28.1* 27.8*  PLT 163 185 188   Recent Labs  Lab 03/16/20 1341 03/16/20 1355 03/17/20 0355 03/18/20 0338 03/19/20 0420 03/20/20 0635 03/21/20 0105  NA 140   < > 141 142 140 139 141  K 4.2   < > 4.1 3.3* 3.4* 3.7 3.5  CL 112*   < > 114* 113* 112* 106 109  CO2 22  --  20* 21* 20* 22 24  GLUCOSE 125*   < > 125* 105* 97 95 101*  BUN 22   < > $R'20 20 16 12 10  'cW$ CREATININE 0.75   < > 0.67 0.60 0.57 0.56 0.57  CALCIUM 8.8*  --  8.4* 8.0* 7.8* 8.1* 8.4*  ALKPHOS 136*  --   --   --   --   --   --   AST 17  --   --   --   --   --   --   ALT 12  --   --   --   --   --   --   ALBUMIN 3.4*  --   --   --   --    --   --    < > = values in this interval not displayed.    Results/Tests Pending at Time of Discharge: None  Discharge Medications:  Allergies as of 03/21/2020       Reactions   Antihistamines, Loratadine-type Other (See Comments)   These cause nose bleeds   Loratadine Other (See Comments)   Caused a nose bleed   Namenda [memantine] Other (See Comments)   Agitation        Medication List     STOP taking these medications    apixaban 5 MG Tabs tablet Commonly known as: ELIQUIS   aspirin EC 81 MG tablet   ibuprofen 400 MG tablet Commonly known as: ADVIL       TAKE these medications    acetaminophen 500 MG tablet Commonly known as: TYLENOL Take 2 tablets (1,000 mg total) by mouth 2 (two) times daily as needed. What changed:  when to take this reasons to take this   atorvastatin 10 MG tablet Commonly known as: LIPITOR Take 1 tablet (10 mg total) by mouth daily.   diltiazem 120 MG 24 hr capsule Commonly known as: Cardizem CD Take 1 capsule (120 mg total) by mouth daily.   ferrous sulfate 325 (65 FE) MG tablet Take 1 tablet (325 mg total) by mouth every other day. Start taking on: March 23, 2020   polyethylene glycol 17 g packet Commonly known as: MIRALAX / GLYCOLAX Take 17 g by mouth daily as needed for mild constipation.   potassium chloride SA 20 MEQ tablet Commonly known as: KLOR-CON Take 1 tablet (20 mEq total) by mouth daily.        Discharge Instructions: Please refer to Patient Instructions section of EMR for full details.  Patient was counseled important signs and symptoms that should prompt return to medical care, changes in medications, dietary instructions, activity restrictions, and follow up appointments.   Follow-Up Appointments:  per ALF   Melinda Dad, MD 03/21/2020, 1:08 PM PGY-1, [place]    I have seen and examined this patient.     I have discussed the findings and exam with  the intern and agree with the above note, which I have edited appropriately. I helped develop the management plan that is described in the resident's note, and I agree with the content.   Doristine Mango, DO PGY-3 Family Medicine Resident

## 2020-03-20 NOTE — Progress Notes (Signed)
Family Medicine Teaching Service Daily Progress Note Intern Pager: [phone]  Patient name: Marisel Rebert Kau Medical record number: [id number] Date of birth: [birth year] Age: 87 y.o. Gender: female  Primary Care Provider: Lajean Manes, MD Consultants: GI (s/o) Code Status: DNR  Pt Overview and Major Events to Date:  1/2: admitted, GI consulted, transfused 1u pRBCs 1/4: transfused additional 1u pRBCs  Assessment and Plan:  RUDY MCCLENNY is a 87 y.o. female presenting with rectal bleeding. PMH is significant for a-fib, prior CVA, HLD, bronchiectasis, L2 compression fracture and Alzheimer's dementia.  GI Bleed Patient reportedly with one additional bloody BM yesterday evening. Hgb improved to 9.0 this morning (8.2 yesterday morning) Vitals remain stable with HR 60-70s and most recent BP 125/58. -GI signed off, appreciate their assistance -Daily CBC -Transfusion threshold <7 -Will d/c Protonix (thought to be lower GI source) -Will discuss restarting Eliquis with patient's POA  Paroxysmal A-Fib Patient in sinus rhythm this morning with HR 60s-70s. Home meds: Eliquis 5mg  BID and Diltiazem 120mg  daily. CHADS2VASC score is 6. -Potentially restart Eliquis pending shared decision making discussion with POA -Continue home Diltiazem -Telemetry monitoring  Recent L2 Compression Fracture L2 compression fracture diagnosed 03/04/20 s/p fall. Has been taking Ibuprofen and Tylenol for pain. -Ongoing PT/OT -Avoid Ibuprofen due to GI bleed -Can add Tylenol q6h prn if needed for pain  Hx of CVA No residual deficits. On ASA and Atorvastatin at home. -Continue Atorvastatin 10mg  daily -Can re-start ASA today pending discussion with POA  HLD Chronic, stable. Home meds: atorvastatin 10mg  daily -Continue home atorvastatin  Alzheimer's Dementia Oriented only to self and city at baseline.  -Delirium precautions  Bronchiectasis  Chronic, stable. Patient stable on room air with SpO2 >95% and  no dyspnea.   FEN/GI: Soft diet PPx: SCDs    Status is: Inpatient Remains inpatient appropriate because: Ongoing diagnostic testing needed not appropriate for outpatient work up  Dispo:  Patient From: SNF  Planned Disposition: SNF  Expected discharge date: 03/21/2020  Medically stable for discharge: No    Subjective:  Patient reportedly had another bloody BM last night. She denies complaints this morning.  Objective: Temp:  [98 F (36.7 C)-98.7 F (37.1 C)] 98.5 F (36.9 C) (01/06 1247) Pulse Rate:  [71-79] 79 (01/06 1247) Resp:  [16-23] 22 (01/06 1247) BP: (100-125)/(50-65) 100/61 (01/06 1247) SpO2:  [96 %-99 %] 96 % (01/06 1247) Physical Exam: General: very pleasant, NAD Cardiovascular: RRR, normal S1/S2 Respiratory: normal WOB, lungs CTAB Abdomen: soft, nontender, nondistended Extremities: scattered ecchymosis on bilateral lower extremities Neuro: alert, oriented to self  Laboratory: Recent Labs  Lab 03/18/20 1229 03/19/20 0420 03/20/20 0309  WBC 10.4 7.8 8.5  HGB 7.6* 8.2* 9.0*  HCT 24.4* 24.6* 28.1*  PLT 190 163 185   Recent Labs  Lab 03/16/20 1341 03/16/20 1355 03/18/20 0338 03/19/20 0420 03/20/20 0635  NA 140   < > 142 140 139  K 4.2   < > 3.3* 3.4* 3.7  CL 112*   < > 113* 112* 106  CO2 22   < > 21* 20* 22  BUN 22   < > 20 16 12   CREATININE 0.75   < > 0.60 0.57 0.56  CALCIUM 8.8*   < > 8.0* 7.8* 8.1*  PROT 6.4*  --   --   --   --   BILITOT 0.4  --   --   --   --   ALKPHOS 136*  --   --   --   --  ALT 12  --   --   --   --   AST 17  --   --   --   --   GLUCOSE 125*   < > 105* 97 95   < > = values in this interval not displayed.    Imaging/Diagnostic Tests: No new imaging/diagnostic tests in last 24h.    Maury Dus, MD 03/20/2020, 12:52 PM PGY-1, [facility] Family Medicine FPTS Intern pager: [phone], text pages welcome

## 2020-03-20 NOTE — Plan of Care (Signed)
  Problem: Education: Goal: Knowledge of General Education information will improve Description Including pain rating scale, medication(s)/side effects and non-pharmacologic comfort measures Outcome: Progressing   

## 2020-03-20 NOTE — Progress Notes (Signed)
Patient had another small bowel movement which was bright red with clots this afternoon. Will continue to monitor.

## 2020-03-20 NOTE — Progress Notes (Signed)
Physical Therapy Treatment Patient Details Name: Melinda Edwards MRN: [id number] DOB: 10-Nov-1926 Today's Date: 03/20/2020    History of Present Illness Patient is a 87 y/o female who presents with blood in stool most consistent with diverticular bleeding. Diverticulosis on CT-Abdomen. PMH includes CVA, HTN, Alzhemier's dementia, pulmonary fibrosis, A-fib.    PT Comments    Pt making good progress today.  She was able to ambulate in hallway with VSS. She did require cues and assist to avoid objects/drifting to the right.  Karie Schwalbe , was present and reports wants pt to return to [facility]F and she is hiring 24 hr support. Continue POC.    Follow Up Recommendations  Other (comment);Home health PT (Return to ALF with 24 hr support (Pat reports she is getting 24 hr support))     Equipment Recommendations  None recommended by PT    Recommendations for Other Services       Precautions / Restrictions Precautions Precautions: Fall;Other (comment) Precaution Comments: watch HR    Mobility  Bed Mobility Overal bed mobility: Needs Assistance Bed Mobility: Supine to Sit     Supine to sit: Min guard     General bed mobility comments: min guard for safety; increased time and use of rail  Transfers Overall transfer level: Needs assistance Equipment used: Rolling walker (2 wheeled) Transfers: Sit to/from [facility] Sit to Stand: Min guard Stand pivot transfers: Min guard       General transfer comment: Sit to stand x 3 with min guard and cues for safe hand placement; Stand pivot from chair to bsc and back to chair; assist with ADLs due to lines/leads  Ambulation/Gait Ambulation/Gait assistance: Min assist Gait Distance (Feet): 150 Feet Assistive device: Rolling walker (2 wheeled) Gait Pattern/deviations: Step-through pattern Gait velocity: decreased   General Gait Details: Pt tending to drift to the right requiring cues to avoid objects and come back  to center of the hall.  At times required min A to correct path.  Karie Schwalbe, Reports pt tends to go R b/c of CVA in past but not this bad.   Stairs             Wheelchair Mobility    Modified Rankin (Stroke Patients Only)       Balance Overall balance assessment: Needs assistance Sitting-balance support: Feet supported;No upper extremity supported Sitting balance-Leahy Scale: Good     Standing balance support: During functional activity;Bilateral upper extremity supported;Single extremity supported Standing balance-Leahy Scale: Poor Standing balance comment: Requiring RW for walking and single UE support during ADLs                            Cognition Arousal/Alertness: Awake/alert Behavior During Therapy: WFL for tasks assessed/performed Overall Cognitive Status: History of cognitive impairments - at baseline                                 General Comments: Pt was oriented to self and able to follow commands      Exercises      General Comments General comments (skin integrity, edema, etc.): Pt's HR 90's rest and up to 106 bpm with ambulation      Pertinent Vitals/Pain Pain Assessment: No/denies pain    Home Living  Prior Function            PT Goals (current goals can now be found in the care plan section) Acute Rehab PT Goals Patient Stated Goal: to go home with therapy PT Goal Formulation: With patient/family Time For Goal Achievement: 04/01/20 Potential to Achieve Goals: Good Progress towards PT goals: Progressing toward goals    Frequency    Min 3X/week      PT Plan Discharge plan needs to be updated    Co-evaluation              AM-PAC PT "6 Clicks" Mobility   Outcome Measure  Help needed turning from your back to your side while in a flat bed without using bedrails?: A Little Help needed moving from lying on your back to sitting on the side of a flat bed without using  bedrails?: A Little Help needed moving to and from a bed to a chair (including a wheelchair)?: A Little Help needed standing up from a chair using your arms (e.g., wheelchair or bedside chair)?: A Little Help needed to walk in hospital room?: A Little Help needed climbing 3-5 steps with a railing? : A Little 6 Click Score: 18    End of Session Equipment Utilized During Treatment: Gait belt Activity Tolerance: Patient tolerated treatment well Patient left: with chair alarm set;in chair;with family/visitor present;with call bell/phone within reach Nurse Communication: Mobility status;Other (comment) (dark bloody clots in stool - RN into see) PT Visit Diagnosis: Unsteadiness on feet (R26.81);Muscle weakness (generalized) (M62.81)     Time: 2957-4734 PT Time Calculation (min) (ACUTE ONLY): 25 min  Charges:  $Gait Training: 8-22 mins $Therapeutic Activity: 8-22 mins                     Anise Salvo, PT Acute Rehab Services Pager [phone] [facility] [phone]     Melinda Edwards 03/20/2020, 12:36 PM

## 2020-03-20 NOTE — TOC Progression Note (Signed)
Transition of Care [facility]) - Progression Note    Patient Details  Name: Melinda Edwards MRN: [id number] Date of Birth: [birth year]  Transition of [facility]) CM/SW Contact  Lorri Frederick, LCSW Phone Number: 03/20/2020, 8:11 AM  Clinical Narrative:   PASSR received: [id number]    Expected Discharge Plan: Skilled Nursing Facility Barriers to Discharge: Continued Medical Work up,SNF Pending bed offer  Expected Discharge Plan and Services Expected Discharge Plan: Skilled Nursing Facility     Post Acute Care Choice: Resumption of Svcs/PTA Provider Living arrangements for the past 2 months: Assisted Living Facility                                       Social Determinants of Health (SDOH) Interventions    Readmission Risk Interventions No flowsheet data found.

## 2020-03-21 LAB — CBC
HCT: 27.8 % — ABNORMAL LOW (ref 36.0–46.0)
Hemoglobin: 9.2 g/dL — ABNORMAL LOW (ref 12.0–15.0)
MCH: 27.4 pg (ref 26.0–34.0)
MCHC: 33.1 g/dL (ref 30.0–36.0)
MCV: 82.7 fL (ref 80.0–100.0)
Platelets: 188 10*3/uL (ref 150–400)
RBC: 3.36 MIL/uL — ABNORMAL LOW (ref 3.87–5.11)
RDW: 20.4 % — ABNORMAL HIGH (ref 11.5–15.5)
WBC: 7.2 10*3/uL (ref 4.0–10.5)
nRBC: 0 % (ref 0.0–0.2)

## 2020-03-21 LAB — BASIC METABOLIC PANEL
Anion gap: 8 (ref 5–15)
BUN: 10 mg/dL (ref 8–23)
CO2: 24 mmol/L (ref 22–32)
Calcium: 8.4 mg/dL — ABNORMAL LOW (ref 8.9–10.3)
Chloride: 109 mmol/L (ref 98–111)
Creatinine, Ser: 0.57 mg/dL (ref 0.44–1.00)
GFR, Estimated: >60 mL/min (ref >60)
Glucose, Bld: 101 mg/dL — ABNORMAL HIGH (ref 70–99)
Potassium: 3.5 mmol/L (ref 3.5–5.1)
Sodium: 141 mmol/L (ref 135–145)

## 2020-03-21 MED ORDER — FERROUS SULFATE 325 (65 FE) MG PO TABS: 325.0000 mg | ORAL_TABLET | ORAL | 3 refills | Status: DC

## 2020-03-21 MED ORDER — POLYETHYLENE GLYCOL 3350 17 G PO PACK: 17.0000 g | PACK | Freq: Every day | ORAL | 0 refills | Status: AC | PRN

## 2020-03-21 MED ORDER — ACETAMINOPHEN 500 MG PO TABS: 1000.0000 mg | ORAL_TABLET | Freq: Two times a day (BID) | ORAL | 0 refills | Status: DC | PRN

## 2020-03-21 MED ORDER — FERROUS SULFATE 325 (65 FE) MG PO TABS
325.0000 mg | ORAL_TABLET | ORAL | Status: DC
  Administered 2020-03-21: 325 mg via ORAL
  Filled 2020-03-21: qty 1

## 2020-03-21 NOTE — TOC Transition Note (Signed)
Transition of Care [facility]) - CM/SW Discharge Note   Patient Details  Name: AUNESTI PELLEGRINO MRN: [id number] Date of Birth: 12/30/1926  Transition of Care [facility]) CM/SW Contact:  Joanne Chars, LCSW Phone Number: 03/21/2020, 1:50 PM   Clinical Narrative:   Pt discharging to [facility] at [place], [place].  RN call report to [phone].  Pt will return to her ALF apartment with 24 hour aide support and PT through the facility.     Final next level of care: Assisted Living Barriers to Discharge: Barriers Resolved   Patient Goals and CMS Choice   CMS Medicare.gov Compare Post Acute Care list provided to:: Patient Represenative (must comment) Choice offered to / list presented to : [place] / [place]  Discharge Placement              Patient chooses bed at:  ([place] [facility]) Patient to be transferred to facility by: [place] Name of family member notified: Fraser Din, friend Patient and family notified of of transfer: 03/21/20  Discharge Plan and Services     Post Acute Care Choice: Resumption of Svcs/PTA Provider                               Social Determinants of Health (SDOH) Interventions     Readmission Risk Interventions No flowsheet data found.

## 2020-03-21 NOTE — Plan of Care (Signed)
  Problem: Education: Goal: Knowledge of General Education information will improve Description Including pain rating scale, medication(s)/side effects and non-pharmacologic comfort measures Outcome: Progressing   

## 2020-03-21 NOTE — Progress Notes (Signed)
Occupational Therapy Treatment Patient Details Name: Melinda Edwards MRN: [id number] DOB: 1927/03/02 Today's Date: 03/21/2020    History of present illness Patient is a 87 y/o female who presents with blood in stool most consistent with diverticular bleeding. Diverticulosis on CT-Abdomen. PMH includes CVA, HTN, Alzhemier's dementia, pulmonary fibrosis, A-fib.   OT comments  Pt received supine in bed getting washed up with NT after incontinent BM. Observed pt to require total A for simulated LB bathing tasks from bed level. Pt continues to present with baseline cognitive impairments, decreased activity tolerance, and generalized weakness impacting pts ability to complete BADLs independently. Pt currently requires MIN A for household distance functional mobility with RW, supervision- set- up for UB ADLs, and total A for LB ADLs. Pt following all commands needing tactile cues and increased time to process but very pleasant and cooperative. Pt would continue to benefit from skilled occupational therapy while admitted and after d/c to address the below listed limitations in order to improve overall functional mobility and facilitate independence with BADL participation. DC plan remains appropriate, will follow acutely per POC.    Follow Up Recommendations  Home health OT;Supervision/Assistance - 24 hour    Equipment Recommendations  3 in 1 bedside commode    Recommendations for Other Services      Precautions / Restrictions Precautions Precautions: Fall;Other (comment) Precaution Comments: watch HR Restrictions Weight Bearing Restrictions: No       Mobility Bed Mobility Overal bed mobility: Needs Assistance Bed Mobility: Supine to Sit     Supine to sit: Min assist     General bed mobility comments: cues needing to sequence all steps of task and light MIN A to elevate trunk into sitting  Transfers Overall transfer level: Needs assistance Equipment used: Rolling walker (2  wheeled) Transfers: Sit to/from [facility] Sit to Stand: Min assist Stand pivot transfers: Min assist       General transfer comment: MIN A to power up into standing from multiple surface including EOB, recliner and BSC, cues needed for hand placement each trial. MIN A for stand pivot transfer from recliner>BSC needing assist to power up and also manage RW and tactile cues for hand placment when ascending and descending onto sitting surface    Balance Overall balance assessment: Needs assistance Sitting-balance support: Feet supported;No upper extremity supported Sitting balance-Leahy Scale: Good Sitting balance - Comments: able to sit EOB with gross supervision   Standing balance support: During functional activity;Bilateral upper extremity supported;Single extremity supported Standing balance-Leahy Scale: Poor Standing balance comment: at least one UE supported during pericar and standing ADLs at sink                           ADL either performed or assessed with clinical judgement   ADL Overall ADL's : Needs assistance/impaired     Grooming: Wash/dry face;Oral care;Sitting;Standing;Supervision/safety;Set up Grooming Details (indicate cue type and reason): pt initially completed task standing but reports faitgue therfore defer oral care to sitting at sink, pt required set- up assist and supervision for safey however pt able to sequence all steps     Lower Body Bathing: Total assistance;Bed level;Sit to/from stand Lower Body Bathing Details (indicate cue type and reason): total A for simulated LB bathing from bed level and standing d/t persistent incontinent BMs         Toilet Transfer: Minimal assistance;Ambulation;RW Toilet Transfer Details (indicate cue type and reason): simulated via functional mobility ,MIN A to  power into standing and to manage RW during ambulation. pt did complete stand pivot transfer from recliner>BSC with MIN A with RW,  tactile cues needed for positoning and to reach back to [facility] when descending onto seat Toileting- Clothing Manipulation and Hygiene: Total assistance;Sit to/from stand;Minimal assistance;Set up [place] Manipulation Details (indicate cue type and reason): pt able to complete anterior pericare via sit<>stand with MIN A for standing balance and set-up, however total A for posterior pericare in standing after BM     Functional mobility during ADLs: Minimal assistance;Rolling walker General ADL Comments: pt continues to present with baseline cognitive impairements, decreased activity tolerance and generalized weakness     Vision       Perception     Praxis      Cognition Arousal/Alertness: Awake/alert Behavior During Therapy: WFL for tasks assessed/performed Overall Cognitive Status: History of cognitive impairments - at baseline                                          Exercises     Shoulder Instructions       General Comments VSS during mobility and ADL participation, worked on using call bell to call NT when pt needs to void bowels/ bladder    Pertinent Vitals/ Pain       Pain Assessment: No/denies pain  Home Living                                          Prior Functioning/Environment              Frequency  Min 2X/week        Progress Toward Goals  OT Goals(current goals can now be found in the care plan section)  Progress towards OT goals: Progressing toward goals  Acute Rehab OT Goals Patient Stated Goal: to go home with therapy OT Goal Formulation: With family Time For Goal Achievement: 04/02/20 Potential to Achieve Goals: Good  Plan Discharge plan remains appropriate;Frequency remains appropriate    Co-evaluation                 AM-PAC OT "6 Clicks" Daily Activity     Outcome Measure   Help from another person eating meals?: A Little Help from another person taking care of personal  grooming?: A Little Help from another person toileting, which includes using toliet, bedpan, or urinal?: A Lot Help from another person bathing (including washing, rinsing, drying)?: A Lot   Help from another person to put on and taking off regular lower body clothing?: A Lot 6 Click Score: 12    End of Session Equipment Utilized During Treatment: Gait belt;Rolling walker  OT Visit Diagnosis: Unsteadiness on feet (R26.81);Muscle weakness (generalized) (M62.81)   Activity Tolerance Patient tolerated treatment well   Patient Left in chair;with call bell/phone within reach;with chair alarm set   Nurse Communication Mobility status        Time: 8921-1941 OT Time Calculation (min): 24 min  Charges: OT General Charges $OT Visit: 1 Visit OT Treatments $Self Care/Home Management : 23-37 mins  Melinda [place]., Melinda Edwards Acute [facility] [phone]    Melinda Edwards 03/21/2020, 8:46 AM

## 2020-03-21 NOTE — Plan of Care (Signed)
  Problem: Pain Managment: Goal: General experience of comfort will improve Outcome: Progressing   Problem: Safety: Goal: Ability to remain free from injury will improve Outcome: Progressing   Problem: Skin Integrity: Goal: Risk for impaired skin integrity will decrease Outcome: Progressing   

## 2020-03-21 NOTE — Plan of Care (Signed)
  Problem: Education: Goal: Knowledge of General Education information will improve Description: Including pain rating scale, medication(s)/side effects and non-pharmacologic comfort measures 03/21/2020 1540 by Durwin Glaze, LPN Outcome: Adequate for Discharge 03/21/2020 1035 by Durwin Glaze, LPN Outcome: Progressing

## 2020-03-21 NOTE — Progress Notes (Signed)
Pt. Discharged in stable condition via car with friend, Fraser Din. AVS documentation given and explained.

## 2020-03-21 NOTE — Progress Notes (Addendum)
Family Medicine Teaching Service Daily Progress Note Intern Pager: [phone]  Patient name: Melinda Edwards Medical record number: [id number] Date of birth: 1926-10-06 Age: 87 y.o. Gender: female  Primary Care Provider: Lajean Manes, MD Consultants: GI (s/o) Code Status: DNR  Pt Overview and Major Events to Date:  1/2: admitted, GI consulted, transfused 1u pRBCs 1/4: transfused additional 1u pRBCs  Assessment and Plan:  Melinda Edwards is a 87 y.o. female who presented with rectal bleeding. PMH is significant for a-fib, prior CVA, HLD, bronchiectasis, L2 compression fracture and Alzheimer's dementia.  GI Bleed Patient with 2 bloody BMs yesterday and another this morning. Hgb remains stable at 9.2 today (9.0 yesterday). Vitals continue to be normal with HR 70s and most recent BP 129/62. -GI signed off, appreciate their assistance -Will reach out to GI again given ongoing bleeding to ensure no procedural intervention is indicated -Will initiate iron supplementation -Will likely continue to hold ASA and Eliquis on discharge -Daily CBC -Transfusion threshold <7  Paroxysmal A-Fib Patient in sinus rhythm this morning with HR 70s. Home meds: Eliquis 5mg  BID and Diltiazem 120mg  daily. CHADS2VASC score is 6. -Likely NOT going to re-start home Eliquis per shared decision making with POA -Continue home Diltiazem -Telemetry monitoring  Recent L2 Compression Fracture L2 compression fracture diagnosed 03/04/20 s/p fall. Has been taking Ibuprofen and Tylenol for pain. -PT/OT -Avoid Ibuprofen due to GI bleed -Can add Tylenol q6h prn if needed for pain  Hx of CVA No residual deficits. On ASA and Atorvastatin at home. -Continue Atorvastatin 10mg  daily -May re-start ASA at some point as outpatient if bleeding resolves  HLD Chronic, stable. Home meds: atorvastatin 10mg  daily -Continue home atorvastatin  Alzheimer's Dementia Oriented only to self and city at baseline.  -Delirium  precautions  Bronchiectasis  Chronic, stable. Patient stable on room air with SpO2 >95% and no dyspnea.   FEN/GI: Soft diet PPx: SCDs    Status is: Inpatient Remains inpatient appropriate because: Ongoing diagnostic testing needed not appropriate for outpatient work up  Dispo:  Patient From: SNF  Planned Disposition: SNF  Expected discharge date: 03/21/2020  Medically stable for discharge: No    Subjective:  Patient denies complaints this morning. Did have an additional bloody BM today.  Objective: Temp:  [98.2 F (36.8 C)-98.7 F (37.1 C)] 98.5 F (36.9 C) (01/07 0744) Pulse Rate:  [74-87] 80 (01/07 0744) Resp:  [18-23] 23 (01/07 0744) BP: (100-129)/(56-64) 116/56 (01/07 0744) SpO2:  [95 %-97 %] 95 % (01/07 0744) Physical Exam: General: very pleasant, NAD Cardiovascular: RRR, normal S1/S2 Respiratory: normal WOB, lungs CTAB Abdomen: soft, nontender, nondistended Extremities: scattered ecchymosis on bilateral upper and lower extremities Neuro: alert, oriented to self and city  Laboratory: Recent Labs  Lab 03/19/20 0420 03/20/20 0309 03/21/20 0105  WBC 7.8 8.5 7.2  HGB 8.2* 9.0* 9.2*  HCT 24.6* 28.1* 27.8*  PLT 163 185 188   Recent Labs  Lab 03/16/20 1341 03/16/20 1355 03/19/20 0420 03/20/20 0635 03/21/20 0105  NA 140   < > 140 139 141  K 4.2   < > 3.4* 3.7 3.5  CL 112*   < > 112* 106 109  CO2 22   < > 20* 22 24  BUN 22   < > 16 12 10   CREATININE 0.75   < > 0.57 0.56 0.57  CALCIUM 8.8*   < > 7.8* 8.1* 8.4*  PROT 6.4*  --   --   --   --   BILITOT  0.4  --   --   --   --   ALKPHOS 136*  --   --   --   --   ALT 12  --   --   --   --   AST 17  --   --   --   --   GLUCOSE 125*   < > 97 95 101*   < > = values in this interval not displayed.    Imaging/Diagnostic Tests: No new imaging/diagnostic tests in last 24h.    Alcus Dad, MD 03/21/2020, 9:51 AM PGY-1, [place] Intern pager: [phone], text pages welcome

## 2020-03-21 NOTE — Progress Notes (Signed)
Report called and given to [place] at [facility] in [place].

## 2020-03-24 DIAGNOSIS — F0390 Unspecified dementia without behavioral disturbance: Secondary | ICD-10-CM | POA: Diagnosis not present

## 2020-03-24 DIAGNOSIS — M6281 Muscle weakness (generalized): Secondary | ICD-10-CM | POA: Diagnosis not present

## 2020-03-24 DIAGNOSIS — R278 Other lack of coordination: Secondary | ICD-10-CM | POA: Diagnosis not present

## 2020-03-25 DIAGNOSIS — F0390 Unspecified dementia without behavioral disturbance: Secondary | ICD-10-CM | POA: Diagnosis not present

## 2020-03-25 DIAGNOSIS — R278 Other lack of coordination: Secondary | ICD-10-CM | POA: Diagnosis not present

## 2020-03-25 DIAGNOSIS — R2689 Other abnormalities of gait and mobility: Secondary | ICD-10-CM | POA: Diagnosis not present

## 2020-03-26 DIAGNOSIS — F0390 Unspecified dementia without behavioral disturbance: Secondary | ICD-10-CM | POA: Diagnosis not present

## 2020-03-26 DIAGNOSIS — M6281 Muscle weakness (generalized): Secondary | ICD-10-CM | POA: Diagnosis not present

## 2020-03-26 DIAGNOSIS — R278 Other lack of coordination: Secondary | ICD-10-CM | POA: Diagnosis not present

## 2020-03-27 ENCOUNTER — Telehealth: Payer: Self-pay | Admitting: Adult Health

## 2020-03-27 DIAGNOSIS — R278 Other lack of coordination: Secondary | ICD-10-CM | POA: Diagnosis not present

## 2020-03-27 DIAGNOSIS — K9289 Other specified diseases of the digestive system: Secondary | ICD-10-CM | POA: Diagnosis not present

## 2020-03-27 DIAGNOSIS — R0902 Hypoxemia: Secondary | ICD-10-CM | POA: Diagnosis not present

## 2020-03-27 DIAGNOSIS — F0390 Unspecified dementia without behavioral disturbance: Secondary | ICD-10-CM | POA: Diagnosis not present

## 2020-03-27 DIAGNOSIS — D500 Iron deficiency anemia secondary to blood loss (chronic): Secondary | ICD-10-CM | POA: Diagnosis not present

## 2020-03-27 DIAGNOSIS — R008 Other abnormalities of heart beat: Secondary | ICD-10-CM | POA: Diagnosis not present

## 2020-03-27 DIAGNOSIS — R2689 Other abnormalities of gait and mobility: Secondary | ICD-10-CM | POA: Diagnosis not present

## 2020-03-27 DIAGNOSIS — I480 Paroxysmal atrial fibrillation: Secondary | ICD-10-CM | POA: Diagnosis not present

## 2020-03-27 DIAGNOSIS — E782 Mixed hyperlipidemia: Secondary | ICD-10-CM | POA: Diagnosis not present

## 2020-03-27 DIAGNOSIS — J8410 Pulmonary fibrosis, unspecified: Secondary | ICD-10-CM | POA: Diagnosis not present

## 2020-03-27 DIAGNOSIS — Z8673 Personal history of transient ischemic attack (TIA), and cerebral infarction without residual deficits: Secondary | ICD-10-CM | POA: Diagnosis not present

## 2020-03-27 NOTE — Telephone Encounter (Signed)
LVM Notified Pt of bad weather on Sunday. Ask patient to call the office on Monday before leaving your home to make sure the office is open.

## 2020-03-31 ENCOUNTER — Ambulatory Visit: Payer: Medicare PPO | Admitting: Adult Health

## 2020-04-01 DIAGNOSIS — R278 Other lack of coordination: Secondary | ICD-10-CM | POA: Diagnosis not present

## 2020-04-01 DIAGNOSIS — R2689 Other abnormalities of gait and mobility: Secondary | ICD-10-CM | POA: Diagnosis not present

## 2020-04-01 DIAGNOSIS — I10 Essential (primary) hypertension: Secondary | ICD-10-CM | POA: Diagnosis not present

## 2020-04-01 DIAGNOSIS — F0390 Unspecified dementia without behavioral disturbance: Secondary | ICD-10-CM | POA: Diagnosis not present

## 2020-04-02 ENCOUNTER — Telehealth: Payer: Self-pay | Admitting: Adult Health

## 2020-04-02 DIAGNOSIS — R278 Other lack of coordination: Secondary | ICD-10-CM | POA: Diagnosis not present

## 2020-04-02 DIAGNOSIS — F0390 Unspecified dementia without behavioral disturbance: Secondary | ICD-10-CM | POA: Diagnosis not present

## 2020-04-02 DIAGNOSIS — M6281 Muscle weakness (generalized): Secondary | ICD-10-CM | POA: Diagnosis not present

## 2020-04-02 NOTE — Telephone Encounter (Signed)
Pt.'s POA Melinda Edwards is asking for a call from RN regarding a soon avail. appt. Please advise.

## 2020-04-03 ENCOUNTER — Ambulatory Visit: Payer: Medicare PPO | Admitting: Adult Health

## 2020-04-03 DIAGNOSIS — R2689 Other abnormalities of gait and mobility: Secondary | ICD-10-CM | POA: Diagnosis not present

## 2020-04-03 DIAGNOSIS — F0390 Unspecified dementia without behavioral disturbance: Secondary | ICD-10-CM | POA: Diagnosis not present

## 2020-04-03 DIAGNOSIS — R278 Other lack of coordination: Secondary | ICD-10-CM | POA: Diagnosis not present

## 2020-04-03 NOTE — Telephone Encounter (Signed)
I called and VM not set up.  Pt has appt 04-07-20 with JM.NP.

## 2020-04-04 DIAGNOSIS — R008 Other abnormalities of heart beat: Secondary | ICD-10-CM | POA: Diagnosis not present

## 2020-04-04 DIAGNOSIS — J84178 Other interstitial pulmonary diseases with fibrosis in diseases classified elsewhere: Secondary | ICD-10-CM | POA: Diagnosis not present

## 2020-04-04 DIAGNOSIS — K922 Gastrointestinal hemorrhage, unspecified: Secondary | ICD-10-CM | POA: Diagnosis not present

## 2020-04-04 DIAGNOSIS — D500 Iron deficiency anemia secondary to blood loss (chronic): Secondary | ICD-10-CM | POA: Diagnosis not present

## 2020-04-04 DIAGNOSIS — M6281 Muscle weakness (generalized): Secondary | ICD-10-CM | POA: Diagnosis not present

## 2020-04-04 DIAGNOSIS — L308 Other specified dermatitis: Secondary | ICD-10-CM | POA: Diagnosis not present

## 2020-04-04 DIAGNOSIS — F0390 Unspecified dementia without behavioral disturbance: Secondary | ICD-10-CM | POA: Diagnosis not present

## 2020-04-04 DIAGNOSIS — I480 Paroxysmal atrial fibrillation: Secondary | ICD-10-CM | POA: Diagnosis not present

## 2020-04-04 DIAGNOSIS — R278 Other lack of coordination: Secondary | ICD-10-CM | POA: Diagnosis not present

## 2020-04-07 ENCOUNTER — Ambulatory Visit: Payer: Medicare PPO | Admitting: Adult Health

## 2020-04-07 ENCOUNTER — Encounter: Payer: Self-pay | Admitting: Adult Health

## 2020-04-07 VITALS — BP 111/74 | HR 96 | Ht 64.0 in | Wt 126.6 lb

## 2020-04-07 DIAGNOSIS — G309 Alzheimer's disease, unspecified: Secondary | ICD-10-CM

## 2020-04-07 DIAGNOSIS — F0150 Vascular dementia without behavioral disturbance: Secondary | ICD-10-CM | POA: Diagnosis not present

## 2020-04-07 DIAGNOSIS — I10 Essential (primary) hypertension: Secondary | ICD-10-CM

## 2020-04-07 DIAGNOSIS — I6381 Other cerebral infarction due to occlusion or stenosis of small artery: Secondary | ICD-10-CM

## 2020-04-07 DIAGNOSIS — F0280 Dementia in other diseases classified elsewhere without behavioral disturbance: Secondary | ICD-10-CM | POA: Diagnosis not present

## 2020-04-07 DIAGNOSIS — I480 Paroxysmal atrial fibrillation: Secondary | ICD-10-CM

## 2020-04-07 DIAGNOSIS — E785 Hyperlipidemia, unspecified: Secondary | ICD-10-CM

## 2020-04-07 NOTE — Patient Instructions (Signed)
Ensure follow up with facility provider regarding ongoing use of iron tablets and itching concern - I will also try to reach out to facility provider requesting they provide you with an update  Continue aspirin 81 mg daily  and atorvastatin 10mg  daily  for secondary stroke prevention  Continue to follow up with PCP regarding cholesterol and blood pressure management  Maintain strict control of hypertension with blood pressure goal below 130/90 and cholesterol with LDL cholesterol (bad cholesterol) goal below 70 mg/dL.      Followup in the future with me in 6 months or call earlier if needed     Thank you for coming to see [place] at [facility]. I hope we have been able to provide you high quality care today.  You may receive a patient satisfaction survey over the next few weeks. We would appreciate your feedback and comments so that we may continue to improve ourselves and the health of our patients.

## 2020-04-07 NOTE — Progress Notes (Signed)
[facility] [address] [place]. [place] [address] [phone]       STROKE FOLLOW UP NOTE  Melinda Edwards Date of Birth:  06/03/1926 Medical Record Number:  [id number]   Reason for Referral: stroke follow up    CHIEF COMPLAINT:  Chief Complaint  Patient presents with  . Follow-up    Treatment room with friend (pat) Pt is doing good    HPI:   Today, 04/07/2020, Melinda Edwards returns for 32-month follow-up accompanied by Fraser Din (POA).  She continues to reside at [facility] where she receives 24/7 care (privately paid).  Stable from stroke standpoint without new stroke/TIA symptoms.  She continues have difficulty with cognitive impairment and gait impairment and currently working with PT.  Ambulates with RW.  Recent hospitalization 1/2-1/11 for lower GI bleed possibly secondary to diverticular bleed vs hemorrhoids.  Eliquis discontinued and recommended restarting aspirin 81 mg daily 2-3 days post discharge if no additional bleeding.  Per ED note, restarting of Eliquis not recommended due to increased bleeding risk which was thoroughly discussed with patient and POA.  She has remained on aspirin 81 mg daily without any additional bleeding and mild bruising.  Remains on atorvastatin 10 mg daily without myalgias.  Blood pressure today 111/74. Fraser Din is concerned regarding use of ferrous sulfate which was initiated during recent admission and small red itchy spots on her arm and face.  She questions ongoing need of this medication.  No further concerns at this time.   History provided for reference purposes only Update 09/26/2019 JM: Ms. Media planner.  Returns for stroke follow-up accompanied by her friend/POA.  She has been stable from a stroke standpoint since prior visit but friend does report gradual decline of cognition and gait.  Her short-term memory has been declining and is less active.  Known baseline dementia prior to stroke previously stable without  worsening.  No evidence of depression or associated behaviors.  MMSE today 10/30.  Continues to reside at [facility] where she receives personal caregiver assistance 10hrs per day 7 days per week.  Use of RW for ambulation with friend reporting slower walking pace.  No recent falls.  Remains on aspirin and Eliquis without bleeding or bruising.  Continues on atorvastatin without myalgias.  Blood pressure today 129/77.  Recent lab work by PCP which was stable per friend but unable to personally view.  No further concerns at this time.  Initial visit 06/14/2019 JM: Melinda Edwards is a 87 year old female who is being seen today, 06/14/2019, for hospital follow-up accompanied by her POA. Has been receiving PT at [facility]. She does have home sitters staying from 8am-8pm for any needed assistance and safety.Residual deficits of right sided weakness but overall improvement.  She continues to work with therapies at [facility].  Ongoing use of rolling walker and denies any recent falls.  Baseline dementia has been stable.  Continues on aspirin and Eliquis without bleeding or bruising.  Continues on atorvastatin 10 mg daily without myalgias.  Blood pressure today 130/72.  Denies new or worsening stroke/TIA symptoms.  Stroke admission 05/10/2019: Melinda Edwards is a 87 y.o. female with history of HTN, HLD  presented on 05/10/2019 with R sided weakness and unsteady gait following a fall on Saturday (05/05/2019) at ALF.  Evaluated by stroke team and Dr. Erlinda Hong with stroke work-up revealing left BG/CR infarct secondary to small vessel disease.  CTA head/neck showed asymptomatic diffuse intracranial arthrosclerosis and questionable supraclinoid  left ICA aneurysm versus irregularity.  Initially recommended DAPT for 3 weeks and aspirin alone but was found in A. fib and RVR on telemetry during admission and initiating Eliquis for secondary stroke prevention.  HTN stable without medication management needed.  LDL  178 and recommended starting atorvastatin 10mg  daily.  No history of DM.  No prior history of stroke.  Baseline memory disorder with dementia stable through admission.  She was discharged to SNF for ongoing therapy needs.  Stroke:   L BG/CR infarct secondary to small vessel disease source  CT head No acute abnormality. Small vessel disease. Atrophy.   MRI  Small L BG/CR infarct. Small vessel disease.   CTA head & neck evolving L basal ganglia and adjacent white matter infarct.  Diffuse intracranial atherosclerosis with severe right MCA, right PCA severe stenosis, left MCA mild to moderate stenosis. ? Supraclinoid L ICA aneurysm vs irregularity  2D Echo EF 60-65%. No source of embolus   LDL 178 -increase atorvastatin to 40 mg daily  HgbA1c 5.6  Lovenox 40 mg sq daily for VTE prophylaxis  No antithrombotic prior to admission, new onset A. fib with RVR and recommended initiating Eliquis  Therapy recommendations:  Discharge to SNF for therapy needs       ROS:   N/A d/t cognitive impairment  PMH:  Past Medical History:  Diagnosis Date  . Alzheimer's dementia ([place])   . Atrial fibrillation ([place]) 05/14/2019  . Hyperlipidemia   . Hypertension   . PAC (premature atrial contraction)    BENIGH  . Palpitations   . Pulmonary fibrosis ([place])   . Pulmonary nodule   . Stroke ([place]) 05/10/2019  . Stroke [facility]) 06/16/2019    PSH:  Past Surgical History:  Procedure Laterality Date  . BREAST EXCISIONAL BIOPSY Right 2003  . DILATION AND CURETTAGE OF UTERUS      Social History:  Social History   Socioeconomic History  . Marital status: Single    Spouse name: Not on file  . Number of children: 0  . Years of education: Not on file  . Highest education level: Not on file  Occupational History  . Occupation: Retired  Tobacco Use  . Smoking status: Former Smoker    Packs/day: 1.00    Types: Cigarettes    Quit date: 08/12/1995    Years since quitting: 24.6  . Smokeless tobacco:  Never Used  Substance and Sexual Activity  . Alcohol use: No  . Drug use: No  . Sexual activity: Not on file  Other Topics Concern  . Not on file  Social History Narrative   ** Merged History Encounter **       06/14/19 living at [facility] Strain: Not on file  Food Insecurity: Not on file  Transportation Needs: Not on file  Physical Activity: Not on file  Stress: Not on file  Social Connections: Not on file  Intimate Partner Violence: Not on file    Family History:  Family History  Problem Relation Age of Onset  . Heart disease Other   . Heart attack Brother        x 3  . Heart failure Mother        chf    Medications:   Current Outpatient Medications on File Prior to Visit  Medication Sig Dispense Refill  . atorvastatin (LIPITOR) 10 MG tablet Take 1 tablet (10 mg total) by mouth daily. 90 tablet 3  .  diltiazem (CARDIZEM CD) 120 MG 24 hr capsule Take 1 capsule (120 mg total) by mouth daily. 30 capsule 11  . ferrous sulfate 325 (65 FE) MG tablet Take 1 tablet (325 mg total) by mouth every other day.  3  . polyethylene glycol (MIRALAX / GLYCOLAX) 17 g packet Take 17 g by mouth daily as needed for mild constipation. 14 each 0  . potassium chloride SA (K-DUR,KLOR-CON) 20 MEQ tablet Take 1 tablet (20 mEq total) by mouth daily. 90 tablet 3  . acetaminophen (TYLENOL) 500 MG tablet 2 tablets as needed     No current facility-administered medications on file prior to visit.    Allergies:   Allergies  Allergen Reactions  . Antihistamines, Loratadine-Type Other (See Comments)    These cause nose bleeds  . Loratadine Other (See Comments)    Caused a nose bleed  . Namenda [Memantine] Other (See Comments)    Agitation     Physical Exam  Vitals:   04/07/20 1438  BP: 111/74  Pulse: 96  SpO2: 95%  Weight: 126 lb 9.6 oz (57.4 kg)  Height: 5\' 4"  (1.626 m)   Body mass index is 21.73 kg/m. No exam data  present  General: Frail very pleasant elderly Caucasian female, seated, in no evident distress Head: head normocephalic and atraumatic.   Neck: supple with no carotid or supraclavicular bruits Cardiovascular: regular rate and rhythm, no murmurs Musculoskeletal: no deformity Skin:   Small red patch R forearm, 3-4 small red patches around cheeks Vascular:  Normal pulses all extremities   Neurologic Exam Mental Status: Awake and fully alert. Normal speech and language.  Disoriented to place and time.  Recent and remote memory diminished. Attention span, concentration and fund of knowledge diminished. Mood and affect appropriate, very pleasant and cooperative with exam.  MMSE - Mini Mental State Exam 09/26/2019  Orientation to time 0  Orientation to Place 0  Registration 3  Attention/ Calculation 1  Recall 0  Language- name 2 objects 2  Language- repeat 1  Language- follow 3 step command 2  Language- read & follow direction 0  Write a sentence 1  Copy design 0  Copy design-comments no animals  Total score 10   Cranial Nerves: Pupils equal, briskly reactive to light. Extraocular movements full without nystagmus. Visual fields full to confrontation. Hearing intact. Facial sensation intact. Face, tongue, palate moves normally and symmetrically.  Motor: Normal bulk and tone. Normal strength in all tested extremity muscles except mild bilateral hip flexor weakness Sensory.: intact to touch , pinprick , position and vibratory sensation.  Coordination: Rapid alternating movements normal in all extremities. Finger-to-nose and heel-to-shin performed accurately bilaterally. Gait and Station: Arises from chair without difficulty. Stance is slightly hunched. Gait demonstrates  slow cautious steps with use of Rollator walker Reflexes: 1+ and symmetric. Toes downgoing.       ASSESSMENT: Melinda Edwards is a 87 y.o. year old female presented on 05/10/2019 with right-sided weakness and on steady gait  post fall 5 days prior to admission with stroke work-up revealing left BG/CR infarct secondary to small vessel disease.  Prior to discharge, found to be in new onset atrial fibrillation with RVR therefore Eliquis initiated in addition to aspirin for secondary stroke prevention.  Eliquis currently on hold due to recent GI bleed and high fall risk.  Vascular risk factors include new onset atrial fibrillation, HTN, HLD, diffuse intercranial stenosis and baseline dementia.     PLAN:  1. Left BG/CR stroke: a. Continue  aspirin 81 mg daily  and atorvastatin 10 mg daily for secondary stroke prevention.   b. Discussed secondary stroke prevention measures and importance of close PCP follow-up for aggressive stroke risk factor management 2. Atrial fibrillation: Currently not on AC due to high bleeding risk per patient and POA wishes - recent GI bleed and fall. POA reports extensively discussing risk versus benefit of restarting AC during recent admission 3. GI bleed: Denies any recurrence of bloody BMs. POA is concerned regarding ongoing use of iron supplement -advised her per review of facility [facility] she was recently changed from ferrous sulfate to ferrous fumarate.  Advised her that she will need to follow-up with facility MD (?Dr. Ardelia Mems per POA) regarding ongoing need of iron supplement and routine lab work 4. Dementia: Likely mixed Alzheimer's and vascular type.  Overall stable since prior visit.  No behavioral concerns. 5. HTN: BP goal<130/90.  Controlled on diltiazem per PCP 6. HLD: LDL goal <70.  On atorvastatin 10 mg daily per PCP    Follow up in 6 months or call earlier if needed   CC:  [facility] provider: Dr. Alvira Monday, MD  (listed PCP) Chrisandra Netters, MD (? Facility MD)   I spent 40 minutes of face-to-face and non-face-to-face time with patient and POA.  This included previsit chart review, lab review, study review, order entry, electronic health record documentation, patient  education regarding prior stroke, atrial fibrillation and risk versus benefit of AC, dementia, importance of managing stroke risk factors and answered all questions to patient and friends satisfaction   Frann Rider, AGNP-BC  [facility] [address] [place], [place] [address]  Phone [phone] Fax [phone] Note: This document was prepared with digital dictation and possible smart phrase technology. Any transcriptional errors that result from this process are unintentional.

## 2020-04-07 NOTE — Progress Notes (Signed)
I agree with the above plan 

## 2020-04-08 DIAGNOSIS — M6281 Muscle weakness (generalized): Secondary | ICD-10-CM | POA: Diagnosis not present

## 2020-04-08 DIAGNOSIS — R278 Other lack of coordination: Secondary | ICD-10-CM | POA: Diagnosis not present

## 2020-04-08 DIAGNOSIS — F0390 Unspecified dementia without behavioral disturbance: Secondary | ICD-10-CM | POA: Diagnosis not present

## 2020-04-09 DIAGNOSIS — R278 Other lack of coordination: Secondary | ICD-10-CM | POA: Diagnosis not present

## 2020-04-09 DIAGNOSIS — M6281 Muscle weakness (generalized): Secondary | ICD-10-CM | POA: Diagnosis not present

## 2020-04-09 DIAGNOSIS — F0390 Unspecified dementia without behavioral disturbance: Secondary | ICD-10-CM | POA: Diagnosis not present

## 2020-04-10 DIAGNOSIS — R278 Other lack of coordination: Secondary | ICD-10-CM | POA: Diagnosis not present

## 2020-04-10 DIAGNOSIS — F0390 Unspecified dementia without behavioral disturbance: Secondary | ICD-10-CM | POA: Diagnosis not present

## 2020-04-10 DIAGNOSIS — R2689 Other abnormalities of gait and mobility: Secondary | ICD-10-CM | POA: Diagnosis not present

## 2020-04-11 DIAGNOSIS — R2689 Other abnormalities of gait and mobility: Secondary | ICD-10-CM | POA: Diagnosis not present

## 2020-04-11 DIAGNOSIS — F0390 Unspecified dementia without behavioral disturbance: Secondary | ICD-10-CM | POA: Diagnosis not present

## 2020-04-11 DIAGNOSIS — R278 Other lack of coordination: Secondary | ICD-10-CM | POA: Diagnosis not present

## 2020-04-14 DIAGNOSIS — M6281 Muscle weakness (generalized): Secondary | ICD-10-CM | POA: Diagnosis not present

## 2020-04-14 DIAGNOSIS — R278 Other lack of coordination: Secondary | ICD-10-CM | POA: Diagnosis not present

## 2020-04-14 DIAGNOSIS — F0390 Unspecified dementia without behavioral disturbance: Secondary | ICD-10-CM | POA: Diagnosis not present

## 2020-06-05 IMAGING — MG DIGITAL SCREENING BILAT W/ TOMO W/ CAD
8 series · 8 of 24 positions shown · non-contrast
Comparison: Previous exam(s).

CLINICAL DATA: Screening.

EXAM:
DIGITAL SCREENING BILATERAL MAMMOGRAM WITH TOMO AND CAD

[L MLO synth-2D]
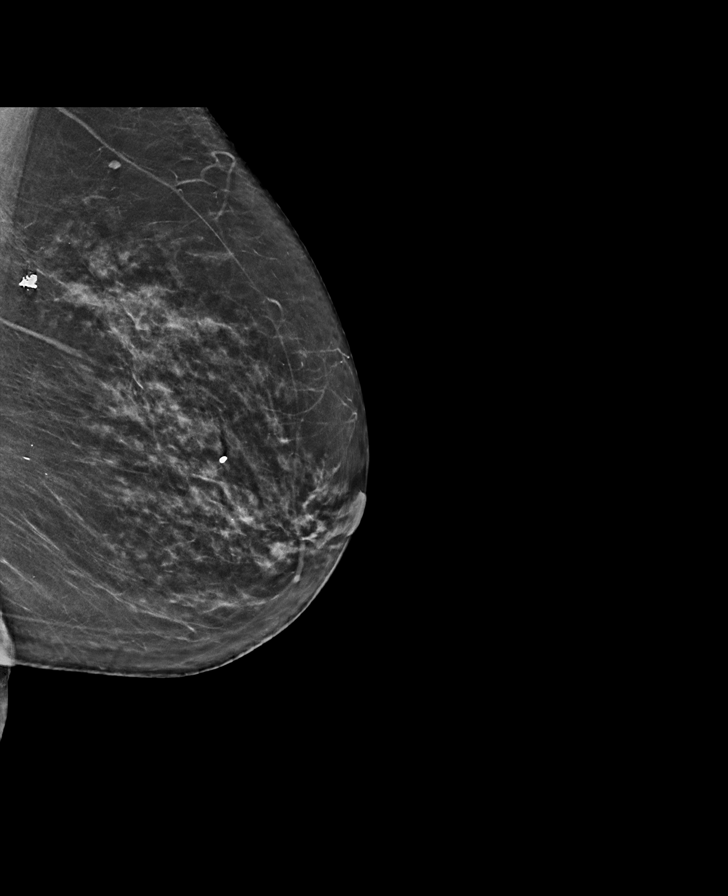

[R MLO synth-2D]
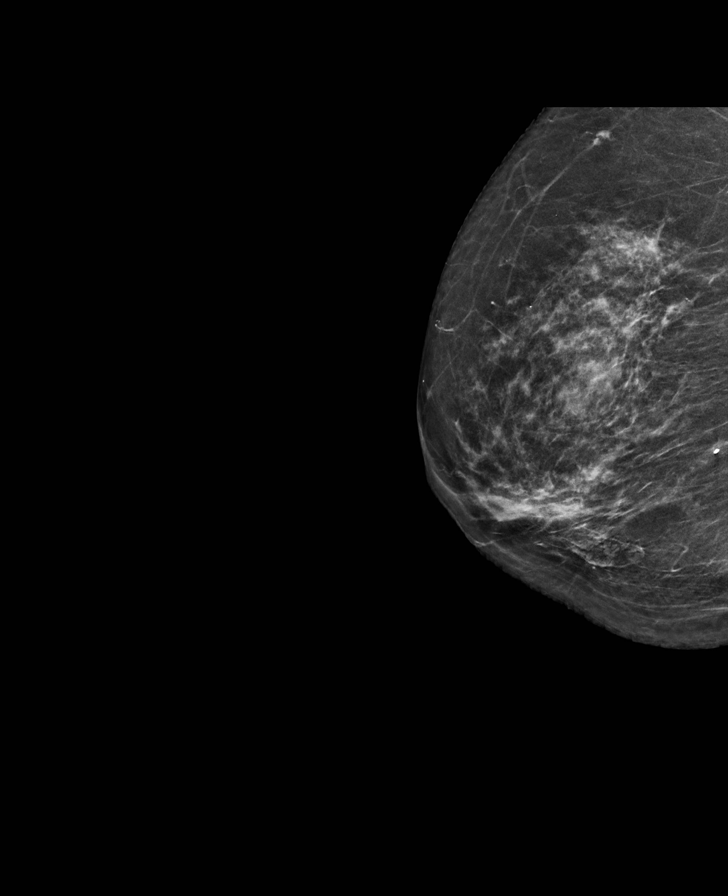

[R CC synth-2D]
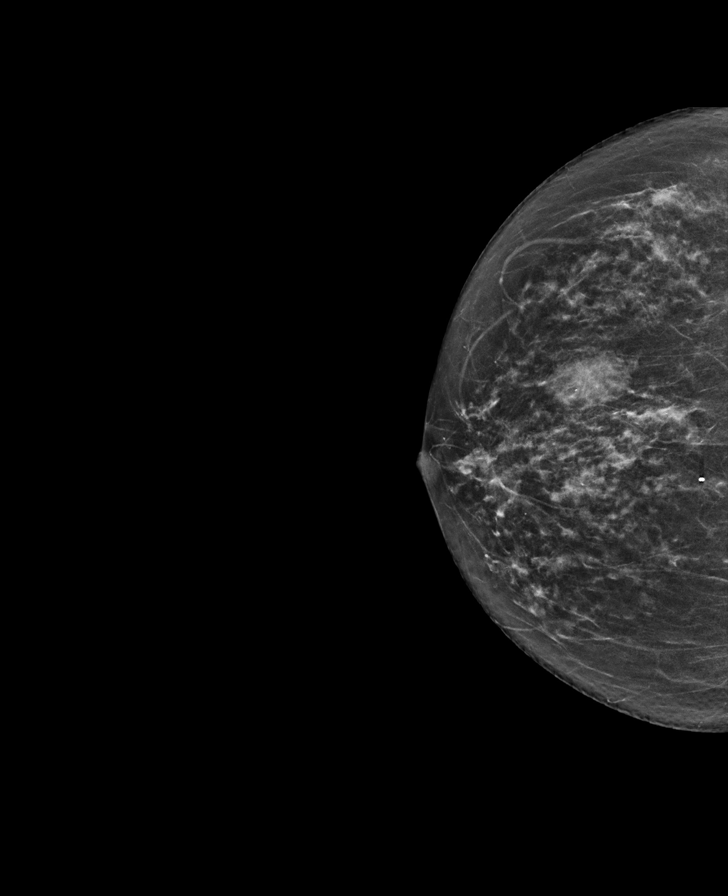

[L CC synth-2D]
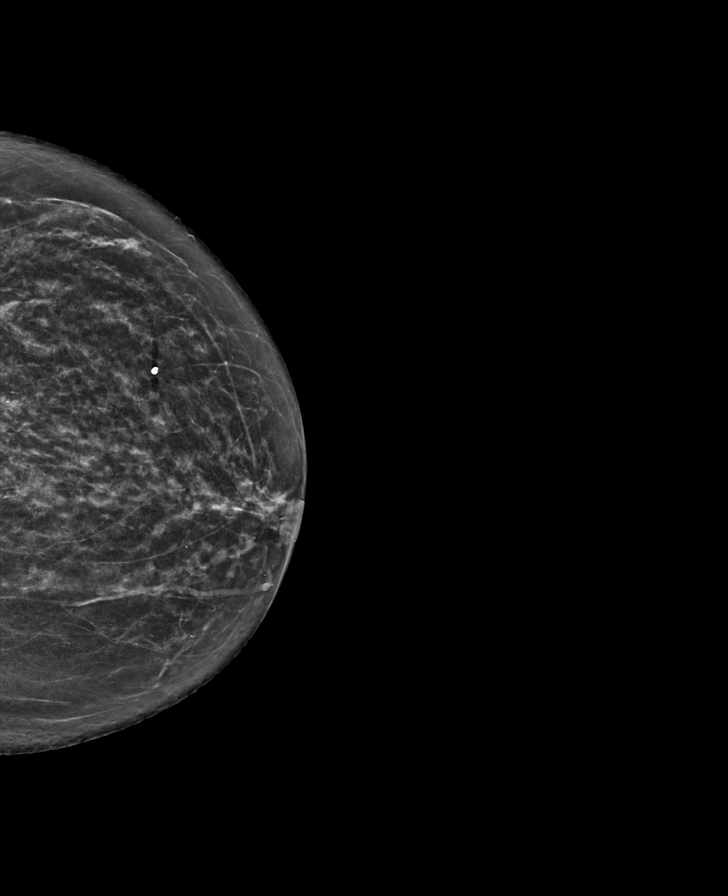

[R MLO tomo · tomo slice 31/60.0]
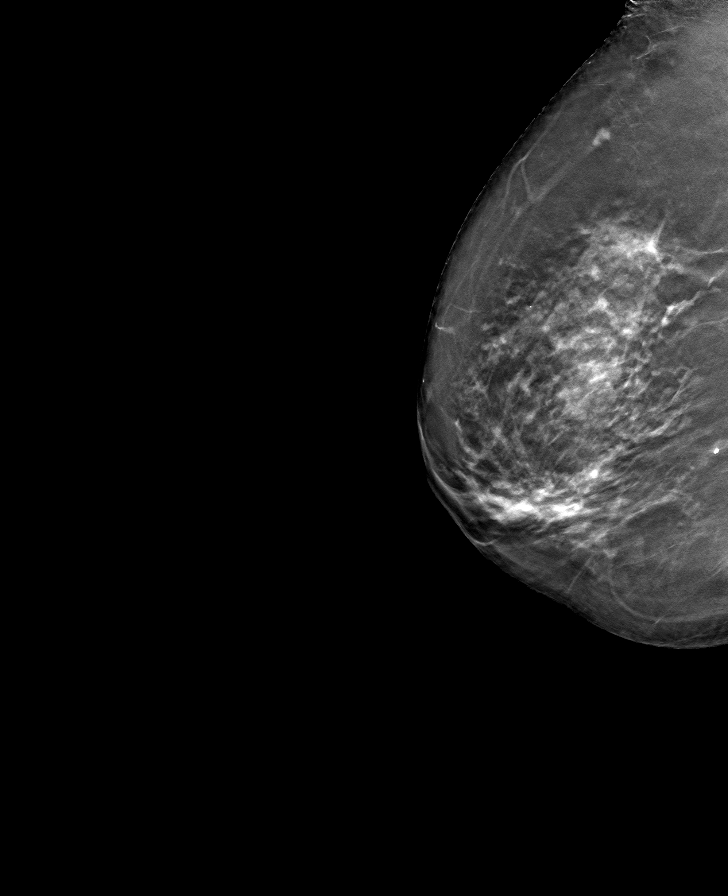

[R CC tomo · tomo slice 28/55.0]
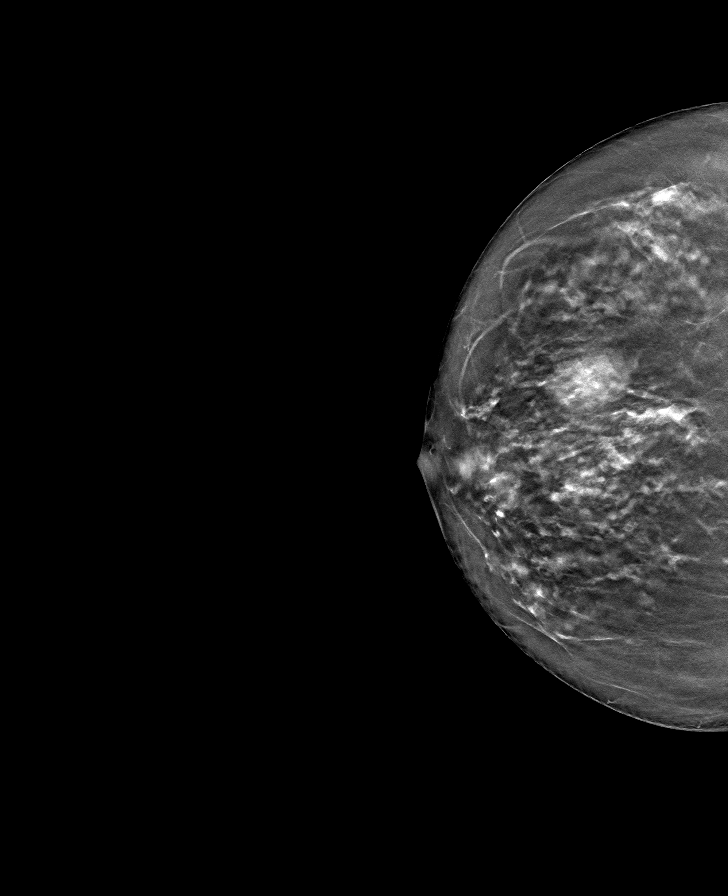

[L CC tomo · tomo slice 28/55.0]
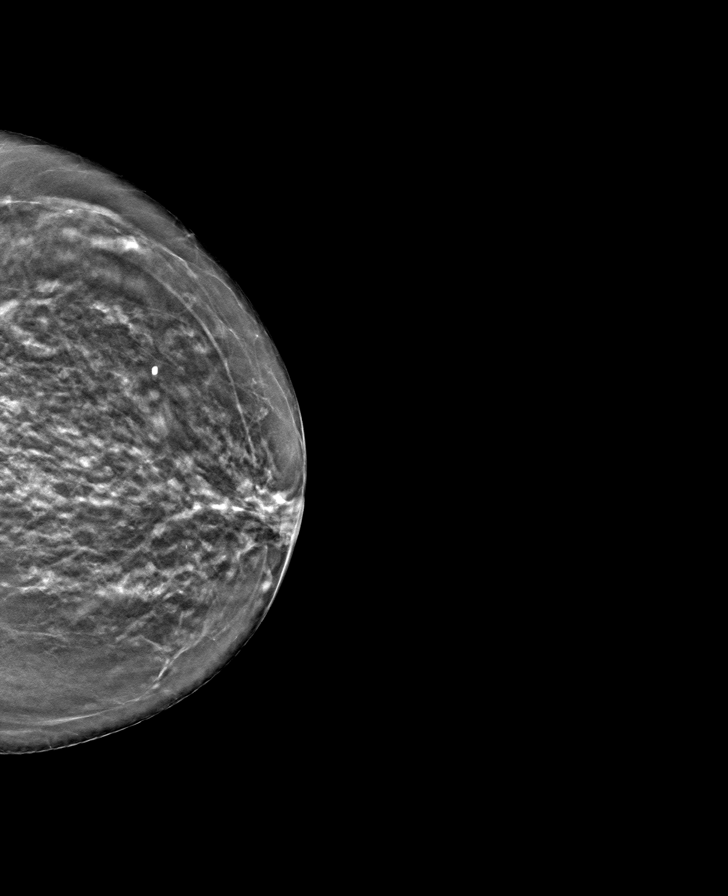

[L MLO tomo · tomo slice 33/65.0]
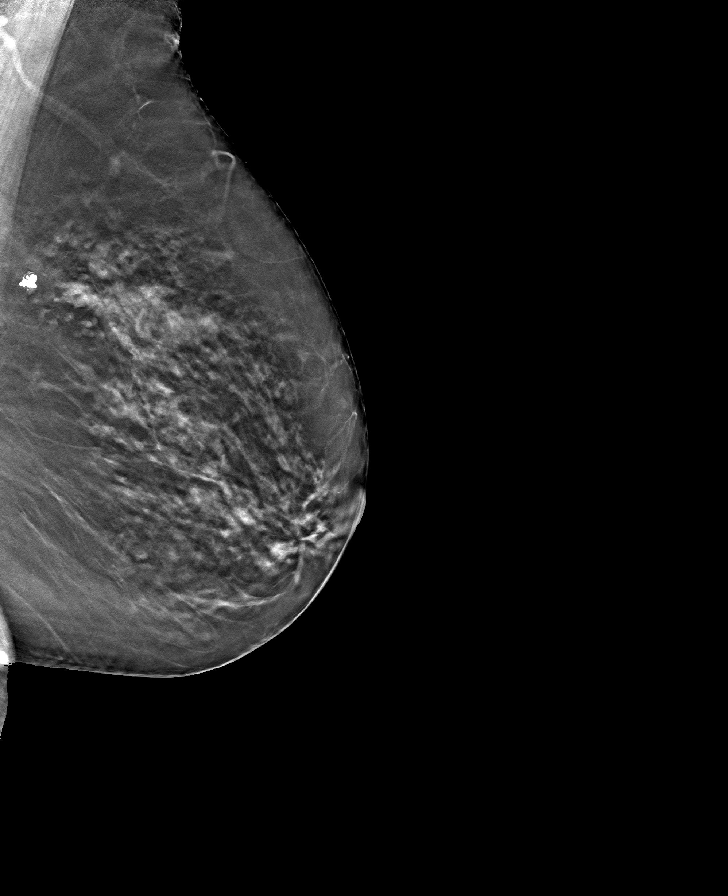

[8 of 24 positions shown; findings below may reference images not displayed]

ACR Breast Density Category c: The breast tissue is heterogeneously
dense, which may obscure small masses.
FINDINGS: There are no findings suspicious for malignancy. Images were
processed with CAD.
IMPRESSION: No mammographic evidence of malignancy. A result letter of this
screening mammogram will be mailed directly to the patient.

RECOMMENDATION:
Screening mammogram in one year. (Code:FT-U-LHB)

BI-RADS CATEGORY  1: Negative.

## 2020-06-19 ENCOUNTER — Other Ambulatory Visit: Payer: Self-pay

## 2020-06-19 ENCOUNTER — Other Ambulatory Visit: Payer: Medicare PPO | Admitting: Nurse Practitioner

## 2020-06-19 DIAGNOSIS — Z515 Encounter for palliative care: Secondary | ICD-10-CM

## 2020-06-19 NOTE — Progress Notes (Signed)
[facility] Consult Note Telephone: [phone]  Fax: [phone]  PATIENT NAME: Melinda Edwards [facility] [address] [place] Melinda Edwards [address] [id number] (home)  DOB: 1926/10/17 MRN: [id number]  PRIMARY CARE PROVIDER:    Lajean Manes, MD,  [place] [place] [address] [place] [address] [phone]  REFERRING PROVIDER:   Lajean Manes, MD [address] [place] [address] [place],  [place] [address] [phone]  RESPONSIBLE PARTY:   Extended Emergency Contact Information Primary Emergency Contact: Melinda Edwards Home Phone: [phone] Mobile Phone: [phone] Relation: Friend  I met face to face with patient in the facility. Patient's significant and caregiver Delores present at visit.  CHIEF COMPLAINT: Poor appetite  ASSESSMENT AND RECOMMENDATIONS:   Advance Care Planning: Today's visit consisted of building trust and discussions on Palliative care medicine as specialized medical care for people living with serious illness, aimed at facilitating improved quality of life through symptoms relief, assisting with advance care planning and establishing goals of care. Family expressed appreciation for education provided on Palliative care and how it differs from Hospice service.  Goal of care: Goal of care is comfort. Significant other want patient to be happy and well cared for. Directives: Patient's code status is DNR. Has signed DNR and MOST form. Reviewed current MOST form with patient's significant other, she asked that section on antibiotics use be changed as she would want patient to receive antibiotics if it will bring her comfort. New MOST form signed with her significant order [name], who is also her POA. Details of the MOST form include; comfort measures, determine use or limitation of antibiotics, no IV fluids, no feeding tube. Copy of MOST form uploaded to [facility] EMR. Palliative care will continue  to provide support to patient, family and the medical team. Questions and concerns were addressed. Significant other encouraged to call with questions and/or concerns. My business card was provided.  I spent 47 minutes providing this consultation. More than 50% of the time in this consultation was spent counseling and coordinating communication.  ---------------------------------------------------------------------------------   Symptom Management:  Poor appetite: Continue encouraging going to dining area for meals, offer and encourage snacking between meals. Continue Magic cup nutritional supplement with meals. Consider starting patient on Remeron 7.$RemoveBefo'5mg'PqQGbSykBpT$  to stimulate appetite. Dementia: Discussion on disease trajectory of dementia with significant other, as it is progressive and terminal and may likely to eventually lead to dysphagia, worsening weight loss and immobility. Emotional and supportive care provided. Continue following up with Neurology. Recommendation discussed with facility provider onsite.  Follow up Palliative Care Visit: Palliative care will continue to follow for complex decision making and symptom management. Return in about 4 weeks or prn.  Family /Caregiver/Community Supports: Patient lives in an Assisted Living, her significant other is her only family. Has 24hrs/7days care giver assistance from [facility] [employer].  Cognitive / Functional decline: Patient awake and alert, confused. Requires maximum assist with her ADLs, able to feed self. Walks with a Rolator walker.  History obtained from review of EMR, discussion with facility staff and interview with patient and significant other. Records reviewed and summarized bellow.  HISTORY OF PRESENT ILLNESS:  MARGERT Edwards is a 87 y.o. year old female with multiple medical problems including Alzheimer, Dementia (FAST 6c), Afib, HTN. Significant other report patient with poor appetite, staff report meal intake of 25-50%, poor appetite  ongoing and has recently worsened in the last month. Symptom associated with weight loss and weakness, with patient wanting to  stay in bed all the time. No report of fever, chills, SOB, chest pain or uncontrolled pain.  All 10 point systems reviewed and negative except as documented in HPI. Palliative Care was asked to follow this patient by consultation request of Lajean Manes, MD to help address advance care planning and goals of care. This is an initial visit.  Reviewed labs 03/21/2020 Na 141, K 3.5, Cr 0.57, GFR >60 Reviewed CT head 02/18/2020 IMPRESSION: 1. No evidence of acute intracranial abnormality. 2. Similar mild for age cerebral atrophy and chronic microvascular ischemic disease.  CODE STATUS: DNR  PPS: 40%  HOSPICE ELIGIBILITY/DIAGNOSIS: TBD   Physical Exam: Current and past weights:  General: frail appearing, thin, cooperative, NAD EYES: anicteric sclera, no discharge  ENMT: intact hearing, oral mucous membranes moist CV:  no LE edema Pulmonary: no increased work of breathing, no cough, no audible wheezes, room air Abdomen: intake 25-50%,  no ascites GU: deferred MSK: moves all extremeities, non ambulatory Skin: warm and dry, no rashes or wounds on visible skin Neuro: Generalized weakness, severe cognitive impairment Psych: non-anxious affect today, A and O x 1 Hem/lymph/immuno: no widespread bruising   PAST MEDICAL HISTORY:  Past Medical History:  Diagnosis Date  . Alzheimer's dementia ([place])   . Atrial fibrillation ([place]) 05/14/2019  . Hyperlipidemia   . Hypertension   . PAC (premature atrial contraction)    BENIGH  . Palpitations   . Pulmonary fibrosis ([place])   . Pulmonary nodule   . Stroke ([place]) 05/10/2019  . Stroke ([place]) 06/16/2019    SOCIAL HX:  Social History   Tobacco Use  . Smoking status: Former Smoker    Packs/day: 1.00    Types: Cigarettes    Quit date: 08/12/1995    Years since quitting: 24.8  . Smokeless tobacco: Never Used  Substance Use  Topics  . Alcohol use: No   FAMILY HX:  Family History  Problem Relation Age of Onset  . Heart disease Other   . Heart attack Brother        x 3  . Heart failure Mother        chf    ALLERGIES:  Allergies  Allergen Reactions  . Antihistamines, Loratadine-Type Other (See Comments)    These cause nose bleeds  . Loratadine Other (See Comments)    Caused a nose bleed  . Namenda [Memantine] Other (See Comments)    Agitation     PERTINENT MEDICATIONS:  Outpatient Encounter Medications as of 06/19/2020  Medication Sig  . acetaminophen (TYLENOL) 500 MG tablet 2 tablets as needed  . aspirin EC 81 MG tablet Take 81 mg by mouth daily. Swallow whole.  Marland Kitchen atorvastatin (LIPITOR) 10 MG tablet Take 1 tablet (10 mg total) by mouth daily.  Marland Kitchen diltiazem (CARDIZEM CD) 120 MG 24 hr capsule Take 1 capsule (120 mg total) by mouth daily.  . Ferrous Fumarate (HEMOCYTE - 106 MG FE) 324 (106 Fe) MG TABS tablet Take 1 tablet by mouth in the morning and at bedtime.  . polyethylene glycol (MIRALAX / GLYCOLAX) 17 g packet Take 17 g by mouth daily as needed for mild constipation.  . potassium chloride SA (K-DUR,KLOR-CON) 20 MEQ tablet Take 1 tablet (20 mEq total) by mouth daily.   No facility-administered encounter medications on file as of 06/19/2020.    Thank you for the opportunity to participate in the care of Mr. Boston Catarino. The palliative care team will continue to follow. Please call our office at [phone] if we  can be of additional assistance.  Jari Favre , DNP, AGPCNP-BC

## 2020-07-17 ENCOUNTER — Non-Acute Institutional Stay: Payer: Medicare PPO | Admitting: Nurse Practitioner

## 2020-07-17 ENCOUNTER — Other Ambulatory Visit: Payer: Self-pay

## 2020-07-17 DIAGNOSIS — R32 Unspecified urinary incontinence: Secondary | ICD-10-CM

## 2020-07-17 DIAGNOSIS — Z515 Encounter for palliative care: Secondary | ICD-10-CM

## 2020-07-17 DIAGNOSIS — R531 Weakness: Secondary | ICD-10-CM

## 2020-07-17 NOTE — Progress Notes (Signed)
  [facility] Consult Note Telephone: [phone]  Fax: [phone]    Date of encounter: 07/17/20 PATIENT NAME: Melinda Edwards [address] [place] [address]   [phone] (home)  DOB: 06/16/1926 MRN: [id number]  PRIMARY CARE PROVIDER:    Stoneking, Hal, MD,  [address] [place] [address] [phone]  REFERRING PROVIDER:   Stoneking, Hal, MD [address] [place],  [place] [address] [phone]  RESPONSIBLE PARTY:    Contact Information    Name Relation Home Work Mobile   Glover, Patricia Friend [phone]  [phone]     I met face to face with patient in facility. POA Pat and her caregiver present during visit. Palliative Care was asked to follow this patient by consultation request of  Stoneking, Hal, MD to address advance care planning and complex medical decision making. This is a follow up visit.                                  ASSESSMENT AND PLAN / RECOMMENDATIONS:   Advance Care Planning/Goals of Care:  CODE STATUS: DNR Goal of Care: Goal of care is comfort. Directives: Signed DNR and MOST form present on chart in the facility, copy on [facility] Epic EMR.  Symptom Management/Plan: Increased urinary incontinence: No evidence of infectious origin. Recent labs reviewed was unremarkable. Continue Myrbetriq 25mg daily. Poor Appetite: condition improved. Intake increased to 75-100%. Continue Mirtazapine 7.5mg at bedtime. Continue Magic cup wth meals. Generalized weakness: Patient's significant other report patient with difficulty getting out of her chair in her room. She is currently receives physical therapy services in the facility. Recommend getting a lift chair to aide transfers. Caregiver concerns: Patient currently have 24hr/7days private caregivers. He caregiver hours are from 8am to 8pm and from 8pm to 8am. Her significant other report planned increase in  the cost of the caregivers by the agency. She expressed concern of running out of funds to care for patient if she continue paying the new cost. She asked if it will be safe for her to discontinue night time care giver services. She report she was told by facility staff that residents are checked on every 2hrs during the night. She was advised that it would be worth while to try and see how patient does without a night time aide to assess for need to continue night time services. She verbalized understanding.  Follow up Palliative Care Visit: Palliative care will continue to follow for complex medical decision making, advance care planning, and clarification of goals. Return in about 6-8 weeks or prn.  PPS: 60% weak  HOSPICE ELIGIBILITY/DIAGNOSIS: TBD  CHIEF COMPLAIN: Increased urinary incontinence  History obtained from review of EMR, discussion with facility staff and interview with patient, significant other and her caregiver. Records reviewed and summarized bellow.  HISTORY OF PRESENT ILLNESS:  Melinda Edwards is a 87 y.o. year old female with multiple medical problems including Alzheimer Dementia (FAST 6c), Afib, HTN.   Patient's significant other report patient with increased urinary incontinence in the context of increased urinary frequency. She expressed concern that patient is using more incontinent briefs during the night, report patient uses 2-3 incontinent briefs a night. Urinary frequency not associated with dysuria, abdominal pain, or malodorous urine. She was evaluated by facility provider and started on low dose Myrbetriq. No report of fever, chills, chest pain, or   SOB. Ten systems reviewed and are negative for acute change, except as noted in the HPI. POA report improved appetite and mood since patient was started on Mirtazepine.      I reviewed available labs, medications, imaging, studies and related documents from the EMR.  Records reviewed and summarized above.   Physical  Exam: Current and past weights:124.2lb up from 123.4lb last month. General: frail appearing, thin, cooperative, NAD EYES: anicteric sclera, no discharge  ENMT: intact hearing, oral mucous membranes moist CV: no LE edema Pulmonary: no increased work of breathing, no cough, no audible wheezes, room air Abdomen: intake 75-100%, no ascites GU: deferred KJZ:PHXTA all extremeities, non ambulatory Skin: warm and dry, no rashes or wounds on visible skin Neuro: Generalized weakness,moderate cognitive impairment Psych: non-anxious affecttoday, A and O x 1 Hem/lymph/immuno: no widespread bruising  Thank you for the opportunity to participate in the care of Ms. Trego.  The palliative care team will continue to follow. Please call our office at [phone] if we can be of additional assistance.   Jari Favre, DNP, AGPCNP-BC  COVID-19 PATIENT SCREENING TOOL Asked and negative response unless otherwise noted:   Have you had symptoms of covid, tested positive or been in contact with someone with symptoms/positive test in the past 5-10 days?

## 2020-10-07 ENCOUNTER — Ambulatory Visit: Payer: Medicare PPO | Admitting: Adult Health

## 2020-10-07 VITALS — BP 134/76 | HR 70

## 2020-10-07 DIAGNOSIS — F0150 Vascular dementia without behavioral disturbance: Secondary | ICD-10-CM

## 2020-10-07 DIAGNOSIS — E785 Hyperlipidemia, unspecified: Secondary | ICD-10-CM | POA: Diagnosis not present

## 2020-10-07 DIAGNOSIS — I10 Essential (primary) hypertension: Secondary | ICD-10-CM | POA: Diagnosis not present

## 2020-10-07 DIAGNOSIS — I6381 Other cerebral infarction due to occlusion or stenosis of small artery: Secondary | ICD-10-CM

## 2020-10-07 DIAGNOSIS — G309 Alzheimer's disease, unspecified: Secondary | ICD-10-CM

## 2020-10-07 DIAGNOSIS — F0280 Dementia in other diseases classified elsewhere without behavioral disturbance: Secondary | ICD-10-CM

## 2020-10-07 DIAGNOSIS — I480 Paroxysmal atrial fibrillation: Secondary | ICD-10-CM

## 2020-10-07 NOTE — Patient Instructions (Signed)
Restart aspirin '81mg'$  daily and continue atorvastatin  for secondary stroke prevention  Continue to follow up with PCP regarding cholesterol and blood pressure management  Maintain strict control of hypertension with blood pressure goal below 130/90 and cholesterol with LDL cholesterol (bad cholesterol) goal below 70 mg/dL.       Followup in the future with me in 6 months or call earlier if needed       Thank you for coming to see [place] at [facility]. I hope we have been able to provide you high quality care today.  You may receive a patient satisfaction survey over the next few weeks. We would appreciate your feedback and comments so that we may continue to improve ourselves and the health of our patients.

## 2020-10-07 NOTE — Progress Notes (Signed)
[facility] [address] [place]. [place] [address] [phone]       STROKE FOLLOW UP NOTE  Ms. Melinda Edwards Date of Birth:  05/27/1926 Medical Record Number:  [id number]   Reason for Referral: stroke follow up    CHIEF COMPLAINT:  Chief Complaint  Patient presents with   Follow-up    Rm 3 with  friend Fraser Din &  caregiver Tiny Pt is well      HPI:   Today, 10/07/2020, Melinda Edwards returns for 85-monthstroke follow-up accompanied by PFraser Din([facility] and caregiver, Tiny.  [facility] that she has been doing very well since prior visit and actually believes her cognition has been improving.  Since she has hired her new caregiver, TChucky May she participates more in conversations and more active around the facility.  Long-term memory remains intact but has had improvement with short-term memory and recall.  She continues to ambulate with rolling walker and denies any recent falls.  Denies new stroke/TIA symptoms.  Continues to ambulate with rolling walker and denies any recent falls.  Remains on atorvastatin without associated side effects.  Previously on aspirin but [facility] Pat unsure reason why she is not currently on aspirin.  Blood pressure today 134/76.  No further concerns at this time.   History provided for reference purposes only Update 04/07/2020 JM: Ms. PZapponereturns for 85-monthollow-up accompanied by PaFraser DinPOA).  [facility] (privately paid).  Stable from stroke standpoint without new stroke/TIA symptoms.  She continues have difficulty with cognitive impairment and gait impairment and currently working with PT.  Ambulates with RW.  Recent hospitalization 1/2-1/11 for lower GI bleed possibly secondary to diverticular bleed vs hemorrhoids.  Eliquis discontinued and recommended restarting aspirin 81 mg daily 2-3 days post discharge if no  additional bleeding.  Per ED note, restarting of Eliquis not recommended due to increased bleeding risk which was thoroughly discussed with patient and POA.  She has remained on aspirin 81 mg daily without any additional bleeding and mild bruising.  Remains on atorvastatin 10 mg daily without myalgias.  Blood pressure today 111/74. PaFraser Dins concerned regarding use of ferrous sulfate which was initiated during recent admission and small red itchy spots on her arm and face.  She questions ongoing need of this medication.  No further concerns at this time.  Update 09/26/2019 JM: Melinda Edwards.  Returns for stroke follow-up accompanied by her friend/POA.  She has been stable from a stroke standpoint since prior visit but friend does report gradual decline of cognition and gait.  Her short-term memory has been declining and is less active.  Known baseline dementia prior to stroke previously stable without worsening.  No evidence of depression or associated behaviors.  MMSE today 10/30.  [facility] assistance 10hrs per day 7 days per week.  Use of RW for ambulation with friend reporting slower walking pace.  No recent falls.  Remains on aspirin and Eliquis without bleeding or bruising.  Continues on atorvastatin without myalgias.  Blood pressure today 129/77.  Recent lab work by PCP which was stable per friend but unable to personally view.  No further concerns at this time.  Initial visit 06/14/2019 JM: Melinda Edwards a [age]ear old female who is being seen today, 06/14/2019, for hospital follow-up accompanied by her POA.  Has been receiving PT at [facility] [place]. She does have home sitters staying from 8am-8pm for any needed assistance and safety.Residual deficits of right sided weakness but overall improvement.  She continues to work with therapies at [facility].  Ongoing use of rolling walker and denies any recent falls.  Baseline dementia has  been stable.  Continues on aspirin and Eliquis without bleeding or bruising.  Continues on atorvastatin 10 mg daily without myalgias.  Blood pressure today 130/72.  Denies new or worsening stroke/TIA symptoms.  Stroke admission 05/10/2019: Ms. Melinda Edwards is a 87 y.o. female with history of HTN, HLD  presented on 05/10/2019 with R sided weakness and unsteady gait following a fall on Saturday (05/05/2019) at ALF.  Evaluated by stroke team and Dr. Erlinda Hong with stroke work-up revealing left BG/CR infarct secondary to small vessel disease.  CTA head/neck showed asymptomatic diffuse intracranial arthrosclerosis and questionable supraclinoid left ICA aneurysm versus irregularity.  Initially recommended DAPT for 3 weeks and aspirin alone but was found in A. fib and RVR on telemetry during admission and initiating Eliquis for secondary stroke prevention.  HTN stable without medication management needed.  LDL 178 and recommended starting atorvastatin '10mg'$  daily.  No history of DM.  No prior history of stroke.  Baseline memory disorder with dementia stable through admission.  She was discharged to SNF for ongoing therapy needs.  Stroke:   L BG/CR infarct secondary to small vessel disease source CT head No acute abnormality. Small vessel disease. Atrophy.  MRI  Small L BG/CR infarct. Small vessel disease.  CTA head & neck evolving L basal ganglia and adjacent white matter infarct.  Diffuse intracranial atherosclerosis with severe right MCA, right PCA severe stenosis, left MCA mild to moderate stenosis. ? Supraclinoid L ICA aneurysm vs irregularity 2D Echo EF 60-65%. No source of embolus  LDL 178 -increase atorvastatin to 40 mg daily HgbA1c 5.6 Lovenox 40 mg sq daily for VTE prophylaxis No antithrombotic prior to admission, new onset A. fib with RVR and recommended initiating Eliquis Therapy recommendations:  Discharge to SNF for therapy needs       ROS:   N/A d/t cognitive impairment  PMH:  Past Medical  History:  Diagnosis Date   Alzheimer's dementia ([place])    Atrial fibrillation ([place]) 05/14/2019   Hyperlipidemia    Hypertension    PAC (premature atrial contraction)    BENIGH   Palpitations    Pulmonary fibrosis ([place])    Pulmonary nodule    Stroke ([place]) 05/10/2019   Stroke ([place]) 06/16/2019    PSH:  Past Surgical History:  Procedure Laterality Date   BREAST EXCISIONAL BIOPSY Right 2003   DILATION AND CURETTAGE OF UTERUS      Social History:  Social History   Socioeconomic History   Marital status: Single    Spouse name: Not on file   Number of children: 0   Years of education: Not on file   Highest education level: Not on file  Occupational History   Occupation: Retired  Tobacco Use   Smoking status: Former    Packs/day: 1.00    Types: Cigarettes    Quit date: 08/12/1995    Years since quitting: 25.1   Smokeless tobacco: Never  Substance and Sexual Activity   Alcohol use: No   Drug use: No   Sexual activity: Not on file  Other Topics Concern   Not on file  Social History Narrative   ** Merged History Encounter **  06/14/19 living at [facility] Strain: Not on file  Food Insecurity: Not on file  Transportation Needs: Not on file  Physical Activity: Not on file  Stress: Not on file  Social Connections: Not on file  Intimate Partner Violence: Not on file    Family History:  Family History  Problem Relation Age of Onset   Heart disease Other    Heart attack Brother        x 3   Heart failure Mother        chf    Medications:   Current Outpatient Medications on File Prior to Visit  Medication Sig Dispense Refill   acetaminophen (TYLENOL) 500 MG tablet 2 tablets as needed     aspirin EC 81 MG tablet Take 81 mg by mouth daily. Swallow whole.     atorvastatin (LIPITOR) 10 MG tablet Take 1 tablet (10 mg total) by mouth daily. 90 tablet 3   b complex vitamins capsule Take 1 capsule by mouth daily.      cholecalciferol (VITAMIN D) 25 MCG (1000 UNIT) tablet Take 1,000 Units by mouth daily.     diltiazem (CARDIZEM CD) 120 MG 24 hr capsule Take 1 capsule (120 mg total) by mouth daily. 30 capsule 11   Ferrous Fumarate (HEMOCYTE - 106 MG FE) 324 (106 Fe) MG TABS tablet Take 1 tablet by mouth in the morning and at bedtime.     hydrocortisone cream 1 % Apply 1 application topically as needed for itching.     mirtazapine (REMERON) 15 MG tablet Take 15 mg by mouth at bedtime.     polyethylene glycol (MIRALAX / GLYCOLAX) 17 g packet Take 17 g by mouth daily as needed for mild constipation. 14 each 0   potassium chloride SA (K-DUR,KLOR-CON) 20 MEQ tablet Take 1 tablet (20 mEq total) by mouth daily. 90 tablet 3   No current facility-administered medications on file prior to visit.    Allergies:   Allergies  Allergen Reactions   Antihistamines, Loratadine-Type Other (See Comments)    These cause nose bleeds   Loratadine Other (See Comments)    Caused a nose bleed   Namenda [Memantine] Other (See Comments)    Agitation     Physical Exam  Vitals:   10/07/20 1052  BP: 134/76  Pulse: 70    There is no height or weight on file to calculate BMI. No results found.  General: Frail very pleasant elderly Caucasian female, seated, in no evident distress Head: head normocephalic and atraumatic.   Neck: supple with no carotid or supraclavicular bruits Cardiovascular: regular rate and rhythm, no murmurs Musculoskeletal: no deformity Vascular:  Normal pulses all extremities   Neurologic Exam Mental Status: Awake and fully alert. Normal speech and language.  Disoriented to place and time.  Recent and remote memory diminished. Attention span, concentration and fund of knowledge diminished. Mood and affect appropriate, very pleasant and cooperative with exam.  MMSE 10/30 09/2019 - declines additional testing Cranial Nerves: Pupils equal, briskly reactive to light. Extraocular movements full without  nystagmus. Visual fields full to confrontation. Hearing intact. Facial sensation intact. Face, tongue, palate moves normally and symmetrically.  Motor: Normal bulk and tone. Normal strength in all tested extremity muscles except mild bilateral hip flexor weakness Sensory.: intact to touch , pinprick , position and vibratory sensation.  Coordination: Rapid alternating movements normal in all extremities. Finger-to-nose and heel-to-shin performed accurately bilaterally. Gait and Station: [employer]  from chair without difficulty. Stance is slightly hunched. Gait demonstrates  slow cautious steps with aide assistance - RW not brought to visit Reflexes: 1+ and symmetric. Toes downgoing.       ASSESSMENT: Melinda Edwards is a 87 y.o. year old female presented on 05/10/2019 with right-sided weakness and on steady gait post fall 5 days prior to admission with stroke work-up revealing left BG/CR infarct secondary to small vessel disease.  Prior to discharge, found to be in new onset atrial fibrillation with RVR therefore Eliquis initiated in addition to aspirin for secondary stroke prevention.  Eliquis currently on hold due to recent GI bleed and high fall risk.  Vascular risk factors include new onset atrial fibrillation, HTN, HLD, diffuse intercranial stenosis and baseline dementia.     PLAN:  Left BG/CR stroke: Advised to restart aspirin 81 mg daily  and continue atorvastatin 10 mg daily for secondary stroke prevention.   Discussed secondary stroke prevention measures and importance of close PCP follow-up for aggressive stroke risk factor management Atrial fibrillation: Currently not on AC due to high bleeding risk per patient and POA wishes - recent GI bleed and falls. POA reports extensively discussing risk versus benefit of restarting AC during recent admission.  Advised to restart aspirin for stroke prevention Dementia: Likely mixed Alzheimer's and vascular type.  Overall stable since prior visit with  subjective improvement per POA.  No behavioral concerns. HTN: BP goal<130/90.  Controlled on diltiazem per PCP HLD: LDL goal <70.  On atorvastatin 10 mg daily per PCP    Follow up in 6 months or call earlier if needed   CC:  [place] provider: Dr. Alvira Monday, MD  (listed PCP) Chrisandra Netters, MD (? Facility MD)   I spent 29 minutes of face-to-face and non-face-to-face time with patient, POA and caregiver.  This included previsit chart review, lab review, study review, order entry, electronic health record documentation, patient and family education regarding prior stroke, secondary stroke prevention measures and aggressive stroke risk factor management, importance of restarting aspirin, atrial fibrillation and risk versus benefit of AC, dementia, and answered all questions to patient, POA and caregiver satisfaction   Frann Rider, AGNP-BC  [facility] [address] [place], [place] [address]  Phone [phone] Fax [phone] Note: This document was prepared with digital dictation and possible smart phrase technology. Any transcriptional errors that result from this process are unintentional.

## 2020-10-09 ENCOUNTER — Encounter: Payer: Self-pay | Admitting: Adult Health

## 2020-10-09 NOTE — Progress Notes (Signed)
I agree with the above plan 

## 2021-04-07 ENCOUNTER — Ambulatory Visit: Payer: Medicare PPO | Admitting: Adult Health

## 2021-04-07 ENCOUNTER — Encounter: Payer: Self-pay | Admitting: Adult Health

## 2021-04-07 VITALS — BP 122/80 | HR 75 | Ht 64.5 in | Wt 134.0 lb

## 2021-04-07 DIAGNOSIS — F0280 Dementia in other diseases classified elsewhere without behavioral disturbance: Secondary | ICD-10-CM | POA: Diagnosis not present

## 2021-04-07 DIAGNOSIS — G309 Alzheimer's disease, unspecified: Secondary | ICD-10-CM

## 2021-04-07 DIAGNOSIS — F0150 Vascular dementia without behavioral disturbance: Secondary | ICD-10-CM | POA: Diagnosis not present

## 2021-04-07 DIAGNOSIS — I6381 Other cerebral infarction due to occlusion or stenosis of small artery: Secondary | ICD-10-CM

## 2021-04-07 NOTE — Patient Instructions (Addendum)
Continue aspirin 81 mg daily  and atorvastatin for secondary stroke prevention  Continue to follow up with PCP regarding cholesterol and blood pressure management  Maintain strict control of hypertension with blood pressure goal below 130/90 and cholesterol with LDL cholesterol (bad cholesterol) goal below 70 mg/dL.   Signs of a Stroke? Follow the BEFAST method:  Balance Watch for a sudden loss of balance, trouble with coordination or vertigo Eyes Is there a sudden loss of vision in one or both eyes? Or double vision?  Face: Ask the person to smile. Does one side of the face droop or is it numb?  Arms: Ask the person to raise both arms. Does one arm drift downward? Is there weakness or numbness of a leg? Speech: Ask the person to repeat a simple phrase. Does the speech sound slurred/strange? Is the person confused ? Time: If you observe any of these signs, call 911.  You can try melatonin 2-5mg  at sunset to help with sleep - okay to take naps occassionally during the day but try to continue to keep her stimulated to help her sleep better at night       Thank you for coming to see [place] at [facility]. I hope we have been able to provide you high quality care today.  You may receive a patient satisfaction survey over the next few weeks. We would appreciate your feedback and comments so that we may continue to improve ourselves and the health of our patients.

## 2021-04-07 NOTE — Progress Notes (Signed)
[facility] [address] [place]. [place] [id number] [phone]       STROKE FOLLOW UP NOTE  Ms. Melinda Edwards Date of Birth:  08/09/1926 Medical Record Number:  [id number]   Reason for Referral: stroke follow up    CHIEF COMPLAINT:  Chief Complaint  Patient presents with   Follow-up    RM 2 with POA Pat and caretaker tiny here for 6 month f/u pt reports she has been doing well.      HPI:   Update 04/07/2021 JM: Returns for 36-month stroke follow-up accompanied by Fraser Din (POA) and cargiver, Tiny.  Continues to reside at [facility].  Overall stable since prior visit without new or reoccurring stroke/TIA symptoms.  Cognition stable. Does report taking more naps throughout the day but unsure if sleeping well at night. Has caregiver during the day but alone at night. No behavioral concerns.  Compliant on aspirin and atorvastatin without side effects. Per caregiver, PCP did recently check cholesterol levels but unable to view via epic.  Blood pressure today 122/80.  No new concerns at this time.     History provided for reference purposes only Update 10/07/2020 JM: Melinda Edwards returns for 71-month stroke follow-up accompanied by Fraser Din [facility]) and caregiver, Tiny.  Current resident of [facility]. Fraser Din reports that she has been doing very well since prior visit and actually believes her cognition has been improving.  Since she has hired her new caregiver, Chucky May, she participates more in conversations and more active around the facility.  Long-term memory remains intact but has had improvement with short-term memory and recall.  She continues to ambulate with rolling walker and denies any recent falls.  Denies new stroke/TIA symptoms.  Continues to ambulate with rolling walker and denies any recent falls.  Remains on atorvastatin without associated side effects.  Previously on aspirin but not currently on [facility] - Pat unsure reason why she is not currently on  aspirin.  Blood pressure today 134/76.  No further concerns at this time.  Update 04/07/2020 JM: Melinda Edwards returns for 68-month follow-up accompanied by Fraser Din (POA).  She continues to reside at [facility] where she receives 24/7 care (privately paid).  Stable from stroke standpoint without new stroke/TIA symptoms.  She continues have difficulty with cognitive impairment and gait impairment and currently working with PT.  Ambulates with RW.  Recent hospitalization 1/2-1/11 for lower GI bleed possibly secondary to diverticular bleed vs hemorrhoids.  Eliquis discontinued and recommended restarting aspirin 81 mg daily 2-3 days post discharge if no additional bleeding.  Per ED note, restarting of Eliquis not recommended due to increased bleeding risk which was thoroughly discussed with patient and POA.  She has remained on aspirin 81 mg daily without any additional bleeding and mild bruising.  Remains on atorvastatin 10 mg daily without myalgias.  Blood pressure today 111/74. Fraser Din is concerned regarding use of ferrous sulfate which was initiated during recent admission and small red itchy spots on her arm and face.  She questions ongoing need of this medication.  No further concerns at this time.  Update 09/26/2019 JM: Melinda Edwards.  Returns for stroke follow-up accompanied by her friend/POA.  She has been stable from a stroke standpoint since prior visit but friend does report gradual decline of cognition and gait.  Her short-term memory has been declining and is less active.  Known baseline dementia prior to stroke previously stable without worsening.  No evidence of depression or  associated behaviors.  MMSE today 10/30.  Continues to reside at [facility] where she receives personal caregiver assistance 10hrs per day 7 days per week.  Use of RW for ambulation with friend reporting slower walking pace.  No recent falls.  Remains on aspirin and Eliquis without bleeding or bruising.  Continues  on atorvastatin without myalgias.  Blood pressure today 129/77.  Recent lab work by PCP which was stable per friend but unable to personally view.  No further concerns at this time.  Initial visit 06/14/2019 JM: Melinda Edwards is a 88 year old female who is being seen today, 06/14/2019, for hospital follow-up accompanied by her POA. Has been receiving PT at [facility]. She does have home sitters staying from 8am-8pm for any needed assistance and safety.Residual deficits of right sided weakness but overall improvement.  She continues to work with therapies at [facility].  Ongoing use of rolling walker and denies any recent falls.  Baseline dementia has been stable.  Continues on aspirin and Eliquis without bleeding or bruising.  Continues on atorvastatin 10 mg daily without myalgias.  Blood pressure today 130/72.  Denies new or worsening stroke/TIA symptoms.  Stroke admission 05/10/2019: Melinda Edwards is a 88 y.o. female with history of HTN, HLD  presented on 05/10/2019 with R sided weakness and unsteady gait following a fall on Saturday (05/05/2019) at ALF.  Evaluated by stroke team and Dr. Erlinda Hong with stroke work-up revealing left BG/CR infarct secondary to small vessel disease.  CTA head/neck showed asymptomatic diffuse intracranial arthrosclerosis and questionable supraclinoid left ICA aneurysm versus irregularity.  Initially recommended DAPT for 3 weeks and aspirin alone but was found in A. fib and RVR on telemetry during admission and initiating Eliquis for secondary stroke prevention.  HTN stable without medication management needed.  LDL 178 and recommended starting atorvastatin 10mg  daily.  No history of DM.  No prior history of stroke.  Baseline memory disorder with dementia stable through admission.  She was discharged to SNF for ongoing therapy needs.  Stroke:   L BG/CR infarct secondary to small vessel disease source CT head No acute abnormality. Small vessel disease. Atrophy.  MRI  Small L BG/CR  infarct. Small vessel disease.  CTA head & neck evolving L basal ganglia and adjacent white matter infarct.  Diffuse intracranial atherosclerosis with severe right MCA, right PCA severe stenosis, left MCA mild to moderate stenosis. ? Supraclinoid L ICA aneurysm vs irregularity 2D Echo EF 60-65%. No source of embolus  LDL 178 -increase atorvastatin to 40 mg daily HgbA1c 5.6 Lovenox 40 mg sq daily for VTE prophylaxis No antithrombotic prior to admission, new onset A. fib with RVR and recommended initiating Eliquis Therapy recommendations:  Discharge to SNF for therapy needs       ROS:   N/A d/t cognitive impairment  PMH:  Past Medical History:  Diagnosis Date   Alzheimer's dementia ([place])    Atrial fibrillation ([place]) 05/14/2019   Hyperlipidemia    Hypertension    PAC (premature atrial contraction)    BENIGH   Palpitations    Pulmonary fibrosis ([place])    Pulmonary nodule    Stroke ([place]) 05/10/2019   Stroke ([facility]) 06/16/2019    PSH:  Past Surgical History:  Procedure Laterality Date   BREAST EXCISIONAL BIOPSY Right 2003   DILATION AND CURETTAGE OF UTERUS      Social History:  Social History   Socioeconomic History   Marital status: Single    Spouse name: Not  on file   Number of children: 0   Years of education: Not on file   Highest education level: Not on file  Occupational History   Occupation: Retired  Tobacco Use   Smoking status: Former    Packs/day: 1.00    Types: Cigarettes    Quit date: 08/12/1995    Years since quitting: 25.6   Smokeless tobacco: Never  Substance and Sexual Activity   Alcohol use: No   Drug use: No   Sexual activity: Not on file  Other Topics Concern   Not on file  Social History Narrative   ** Merged History Encounter **       06/14/19 living at [facility] Strain: Not on file  Food Insecurity: Not on file  Transportation Needs: Not on file  Physical Activity: Not on file   Stress: Not on file  Social Connections: Not on file  Intimate Partner Violence: Not on file    Family History:  Family History  Problem Relation Age of Onset   Heart disease Other    Heart attack Brother        x 3   Heart failure Mother        chf    Medications:   Current Outpatient Medications on File Prior to Visit  Medication Sig Dispense Refill   acetaminophen (TYLENOL) 500 MG tablet 2 tablets as needed     aspirin EC 81 MG tablet Take 81 mg by mouth daily. Swallow whole.     atorvastatin (LIPITOR) 10 MG tablet Take 1 tablet (10 mg total) by mouth daily. 90 tablet 3   b complex vitamins capsule Take 1 capsule by mouth daily.     cephALEXin (KEFLEX) 250 MG capsule Take by mouth 4 (four) times daily.     cholecalciferol (VITAMIN D) 25 MCG (1000 UNIT) tablet Take 1,000 Units by mouth daily.     Ferrous Fumarate (HEMOCYTE - 106 MG FE) 324 (106 Fe) MG TABS tablet Take 1 tablet by mouth in the morning and at bedtime.     hydrocortisone cream 1 % Apply 1 application topically as needed for itching.     mirtazapine (REMERON) 15 MG tablet Take 15 mg by mouth at bedtime.     polyethylene glycol (MIRALAX / GLYCOLAX) 17 g packet Take 17 g by mouth daily as needed for mild constipation. 14 each 0   potassium chloride SA (K-DUR,KLOR-CON) 20 MEQ tablet Take 1 tablet (20 mEq total) by mouth daily. 90 tablet 3   Probiotic Product (PROBIOTIC-10 PO) Take by mouth.     diltiazem (CARDIZEM CD) 120 MG 24 hr capsule Take 1 capsule (120 mg total) by mouth daily. 30 capsule 11   No current facility-administered medications on file prior to visit.    Allergies:   Allergies  Allergen Reactions   Antihistamines, Loratadine-Type Other (See Comments)    These cause nose bleeds   Loratadine Other (See Comments)    Caused a nose bleed   Namenda [Memantine] Other (See Comments)    Agitation     Physical Exam  Vitals:   04/07/21 1115  BP: 122/80  Pulse: 75  SpO2: 95%  Weight: 134 lb  (60.8 kg)  Height: 5' 4.5" (1.638 m)   Body mass index is 22.65 kg/m. No results found.  General: Frail very pleasant elderly Caucasian female, seated, in no evident distress Head: head normocephalic and atraumatic.   Neck: supple with no  carotid or supraclavicular bruits Cardiovascular: regular rate and rhythm, no murmurs Musculoskeletal: no deformity Vascular:  Normal pulses all extremities   Neurologic Exam Mental Status: Awake and fully alert. Normal speech and language.  Disoriented to place and time.  Recent and remote memory diminished. Attention span, concentration and fund of knowledge diminished. Mood and affect appropriate, very pleasant and cooperative with exam.  MMSE 10/30 09/2019 - declines additional testing Cranial Nerves: Pupils equal, briskly reactive to light. Extraocular movements full without nystagmus. Visual fields full to confrontation.  HOH bilaterally. Facial sensation intact. Face, tongue, palate moves normally and symmetrically.  Motor: Normal bulk and tone. Normal strength in all tested extremity muscles except mild bilateral hip flexor weakness Sensory.: intact to touch , pinprick , position and vibratory sensation.  Coordination: Rapid alternating movements normal in all extremities. Finger-to-nose and heel-to-shin performed accurately bilaterally. Gait and Station: Arises from chair without difficulty. Stance is slightly hunched. Gait demonstrates  slow cautious steps with RW.  Tandem walk and heel toe not attempted Reflexes: 1+ and symmetric. Toes downgoing.       ASSESSMENT: Melinda Edwards is a 88 y.o. year old female presented on 05/10/2019 with right-sided weakness and on steady gait post fall 5 days prior to admission with stroke work-up revealing left BG/CR infarct secondary to small vessel disease.  Prior to discharge, found to be in new onset atrial fibrillation with RVR therefore Eliquis initiated in addition to aspirin for secondary stroke  prevention.  Eliquis currently on hold due to recent GI bleed and high fall risk.  Vascular risk factors include new onset atrial fibrillation, HTN, HLD, diffuse intercranial stenosis and baseline dementia.     PLAN:  Left BG/CR stroke: Recovered well without residual deficits Continue aspirin 81 mg daily  and atorvastatin 10 mg daily for secondary stroke prevention.   Discussed secondary stroke prevention measures and importance of close PCP follow-up for aggressive stroke risk factor management Atrial fibrillation: Currently not on AC due to high bleeding risk per patient and POA wishes - hx of GI bleed and falls. Will continue on aspirin.  Dementia: Likely mixed Alzheimer's and vascular type.  Stable without worsening. Discussed increased naps during the day - may consider use of melatonin as sleep cycle may be altered. Encouraged continued stimulation during the day and avoid too many naps. No behavioral concerns HTN: BP goal<130/90.  Controlled on diltiazem per PCP HLD: LDL goal <70.  On atorvastatin 10 mg daily per PCP    Overall stable - follow up as needed   CC:  Stoneking, Hal, MD  (listed PCP)   I spent 31 minutes of face-to-face and non-face-to-face time with patient, POA and caregiver.  This included previsit chart review, lab review, study review, electronic health record documentation, patient and family education regarding prior stroke, secondary stroke prevention measures and aggressive stroke risk factor management, dementia and sleep wake cycle, and answered all questions to patient, POA and caregiver satisfaction   Frann Rider, AGNP-BC  [facility] [address] [place], [place] [address]  Phone [phone] Fax [phone] Note: This document was prepared with digital dictation and possible smart phrase technology. Any transcriptional errors that result from this process are unintentional.

## 2021-05-20 ENCOUNTER — Emergency Department (HOSPITAL_BASED_OUTPATIENT_CLINIC_OR_DEPARTMENT_OTHER): Payer: Medicare PPO

## 2021-05-20 ENCOUNTER — Encounter (HOSPITAL_BASED_OUTPATIENT_CLINIC_OR_DEPARTMENT_OTHER): Payer: Self-pay | Admitting: Emergency Medicine

## 2021-05-20 ENCOUNTER — Other Ambulatory Visit: Payer: Self-pay

## 2021-05-20 ENCOUNTER — Emergency Department (HOSPITAL_BASED_OUTPATIENT_CLINIC_OR_DEPARTMENT_OTHER)
Admission: EM | Admit: 2021-05-20 | Discharge: 2021-05-20 | Disposition: A | Discharge status: Home / Self Care | Payer: Medicare PPO | Attending: Emergency Medicine | Admitting: Emergency Medicine

## 2021-05-20 DIAGNOSIS — F0390 Unspecified dementia without behavioral disturbance: Secondary | ICD-10-CM | POA: Insufficient documentation

## 2021-05-20 DIAGNOSIS — Z7982 Long term (current) use of aspirin: Secondary | ICD-10-CM | POA: Insufficient documentation

## 2021-05-20 DIAGNOSIS — Z23 Encounter for immunization: Secondary | ICD-10-CM | POA: Diagnosis not present

## 2021-05-20 DIAGNOSIS — I10 Essential (primary) hypertension: Secondary | ICD-10-CM | POA: Diagnosis not present

## 2021-05-20 DIAGNOSIS — S0101XA Laceration without foreign body of scalp, initial encounter: Secondary | ICD-10-CM | POA: Diagnosis present

## 2021-05-20 DIAGNOSIS — W01198A Fall on same level from slipping, tripping and stumbling with subsequent striking against other object, initial encounter: Secondary | ICD-10-CM | POA: Diagnosis not present

## 2021-05-20 DIAGNOSIS — W19XXXA Unspecified fall, initial encounter: Secondary | ICD-10-CM

## 2021-05-20 MED ORDER — ONDANSETRON HCL 4 MG PO TABS: 4.0000 mg | ORAL_TABLET | Freq: Three times a day (TID) | ORAL | 0 refills | Status: DC | PRN

## 2021-05-20 MED ORDER — TETANUS-DIPHTH-ACELL PERTUSSIS 5-2.5-18.5 LF-MCG/0.5 IM SUSY
0.5000 mL | PREFILLED_SYRINGE | Freq: Once | INTRAMUSCULAR | Status: AC
  Administered 2021-05-20: 0.5 mL via INTRAMUSCULAR
  Filled 2021-05-20: qty 0.5

## 2021-05-20 MED ORDER — ONDANSETRON 4 MG PO TBDP
4.0000 mg | ORAL_TABLET | Freq: Once | ORAL | Status: AC
  Administered 2021-05-20: 4 mg via ORAL
  Filled 2021-05-20: qty 1

## 2021-05-20 NOTE — ED Triage Notes (Signed)
PT arrives via [facility] from [facility]. Per POA, pt tripped over wheels over another persons walker. Pt fell backwards and hit head. Laceration to back of head. Denies LOC or blood thinners. Pt denies dizziness.  ?

## 2021-05-20 NOTE — ED Provider Notes (Signed)
[facility] EMERGENCY DEPT Provider Note   CSN: [id number] Arrival date & time: 05/20/21  1552     History  Chief Complaint  Patient presents with   Melinda Edwards is a 88 y.o. female.  HPI  88 year old female with past medical history of HTN, previous CVA, dementia, pulmonary fibrosis/scarring, atrial fibrillation not on anticoagulation presents emergency department mechanical fall.  Patient was standing with her friend when she went to step forward and caught the wheel of her walker.  She fell back hitting her head.  No loss of consciousness.  Denies any other traumatic injury.  Sustained a small laceration to the posterior scalp.  Unclear when the last tetanus shot was.  Patient is at an assisted living facility.  No other recent or acute illness.  Home Medications Prior to Admission medications   Medication Sig Start Date End Date Taking? Authorizing Provider  acetaminophen (TYLENOL) 500 MG tablet 2 tablets as needed    [provider]  aspirin EC 81 MG tablet Take 81 mg by mouth daily. Swallow whole.    [provider]  atorvastatin (LIPITOR) 10 MG tablet Take 1 tablet (10 mg total) by mouth daily. 10/19/13   Darlin Coco, MD  b complex vitamins capsule Take 1 capsule by mouth daily.    [provider]  cephALEXin (KEFLEX) 250 MG capsule Take by mouth 4 (four) times daily.    [provider]  cholecalciferol (VITAMIN D) 25 MCG (1000 UNIT) tablet Take 1,000 Units by mouth daily.    [provider]  diltiazem (CARDIZEM CD) 120 MG 24 hr capsule Take 1 capsule (120 mg total) by mouth daily. 05/14/19 10/07/20  Charlynne Cousins, MD  Ferrous Fumarate (HEMOCYTE - 106 MG FE) 324 (106 Fe) MG TABS tablet Take 1 tablet by mouth in the morning and at bedtime.    [provider]  hydrocortisone cream 1 % Apply 1 application topically as needed for itching.    [provider]  mirtazapine (REMERON) 15 MG  tablet Take 15 mg by mouth at bedtime.    [provider]  polyethylene glycol (MIRALAX / GLYCOLAX) 17 g packet Take 17 g by mouth daily as needed for mild constipation. 03/21/20   Alcus Dad, MD  potassium chloride SA (K-DUR,KLOR-CON) 20 MEQ tablet Take 1 tablet (20 mEq total) by mouth daily. 10/19/13   Darlin Coco, MD  Probiotic Product (PROBIOTIC-10 PO) Take by mouth.    [provider]      Allergies    Antihistamines, loratadine-type; Loratadine; and Namenda [memantine]    Review of Systems   Review of Systems  Constitutional:  Negative for fever.  Respiratory:  Negative for shortness of breath.   Cardiovascular:  Negative for chest pain.  Gastrointestinal:  Negative for abdominal pain.  Musculoskeletal:  Negative for back pain and neck pain.  Skin:  Positive for wound.  Neurological:  Negative for headaches.   Physical Exam Updated Vital Signs BP 119/68    Pulse 76    Temp 98.8 F (37.1 C) (Oral)    Resp 19    SpO2 94%  Physical Exam Vitals and nursing note reviewed.  Constitutional:      General: She is not in acute distress.    Appearance: Normal appearance.  HENT:     Head: Normocephalic.     Mouth/Throat:     Mouth: Mucous membranes are moist.  Eyes:     Pupils: Pupils are equal, round,  and reactive to light.  Cardiovascular:     Rate and Rhythm: Normal rate.  Pulmonary:     Effort: Pulmonary effort is normal. No respiratory distress.  Abdominal:     Palpations: Abdomen is soft.     Tenderness: There is no abdominal tenderness.  Musculoskeletal:     Cervical back: No rigidity or tenderness.  Skin:    General: Skin is warm.     Comments: Small area of bleeding on the posterior scalp that is dressed, no active bleeding or pulsatile bleeding  Neurological:     Mental Status: She is alert. Mental status is at baseline.    ED Results / Procedures / Treatments   Labs (all labs ordered are listed, but only abnormal results are  displayed) Labs Reviewed - No data to display  EKG EKG Interpretation  Date/Time:  Wednesday May 20 2021 15:58:40 EST Ventricular Rate:  88 PR Interval:  168 QRS Duration: 89 QT Interval:  374 QTC Calculation: 453 R Axis:   -17 Text Interpretation: Sinus rhythm Borderline left axis deviation Low voltage, precordial leads Confirmed by Lavenia Atlas [id number]) on 05/20/2021 4:46:48 PM  Radiology CT Head Wo Contrast  Result Date: 05/20/2021 CLINICAL DATA:  [place] and hit head EXAM: CT HEAD WITHOUT CONTRAST CT CERVICAL SPINE WITHOUT CONTRAST TECHNIQUE: Multidetector CT imaging of the head and cervical spine was performed following the standard protocol without intravenous contrast. Multiplanar CT image reconstructions of the cervical spine were also generated. RADIATION DOSE REDUCTION: This exam was performed according to the departmental dose-optimization program which includes automated exposure control, adjustment of the mA and/or kV according to patient size and/or use of iterative reconstruction technique. COMPARISON:  CT 05/10/2019 trauma CT chest 12/02/2017 FINDINGS: CT HEAD FINDINGS Brain: No acute territorial infarction, hemorrhage, or intracranial mass. Mild atrophy. Mild chronic small vessel ischemic changes of the white matter. Probable chronic lacunar infarcts within the bilateral basal ganglia. Vascular: No hyperdense vessels.  Carotid vascular calcification Skull: Normal. Negative for fracture or focal lesion. Sinuses/Orbits: No acute finding. Other: None CT CERVICAL SPINE FINDINGS Alignment: No subluxation.  Facet alignment within normal limits Skull base and vertebrae: No acute fracture. No primary bone lesion or focal pathologic process. Soft tissues and spinal canal: No prevertebral fluid or swelling. No visible canal hematoma. Disc levels: Multilevel degenerative changes. Moderate severe disc space narrowing C3-C4 and C5-C6. Facet degenerative changes at multiple levels with mild  foraminal narrowing Upper chest: Biapical pleural and parenchymal scarring Other: None IMPRESSION: 1. No CT evidence for acute intracranial abnormality. Atrophy and chronic small vessel ischemic changes of the white matter 2. Degenerative changes of the cervical spine. No acute osseous abnormality Electronically Signed   By: Donavan Foil M.D.   On: 05/20/2021 18:45   CT Cervical Spine Wo Contrast  Result Date: 05/20/2021 CLINICAL DATA:  [place] and hit head EXAM: CT HEAD WITHOUT CONTRAST CT CERVICAL SPINE WITHOUT CONTRAST TECHNIQUE: Multidetector CT imaging of the head and cervical spine was performed following the standard protocol without intravenous contrast. Multiplanar CT image reconstructions of the cervical spine were also generated. RADIATION DOSE REDUCTION: This exam was performed according to the departmental dose-optimization program which includes automated exposure control, adjustment of the mA and/or kV according to patient size and/or use of iterative reconstruction technique. COMPARISON:  CT 05/10/2019 trauma CT chest 12/02/2017 FINDINGS: CT HEAD FINDINGS Brain: No acute territorial infarction, hemorrhage, or intracranial mass. Mild atrophy. Mild chronic small vessel ischemic changes of the white matter. Probable chronic lacunar infarcts  within the bilateral basal ganglia. Vascular: No hyperdense vessels.  Carotid vascular calcification Skull: Normal. Negative for fracture or focal lesion. Sinuses/Orbits: No acute finding. Other: None CT CERVICAL SPINE FINDINGS Alignment: No subluxation.  Facet alignment within normal limits Skull base and vertebrae: No acute fracture. No primary bone lesion or focal pathologic process. Soft tissues and spinal canal: No prevertebral fluid or swelling. No visible canal hematoma. Disc levels: Multilevel degenerative changes. Moderate severe disc space narrowing C3-C4 and C5-C6. Facet degenerative changes at multiple levels with mild foraminal narrowing Upper chest:  Biapical pleural and parenchymal scarring Other: None IMPRESSION: 1. No CT evidence for acute intracranial abnormality. Atrophy and chronic small vessel ischemic changes of the white matter 2. Degenerative changes of the cervical spine. No acute osseous abnormality Electronically Signed   By: Donavan Foil M.D.   On: 05/20/2021 18:45    Procedures .Marland KitchenLaceration Repair  Date/Time: 05/20/2021 8:56 PM Performed by: Lorelle Gibbs, DO Authorized by: Lorelle Gibbs, DO   Consent:    Consent obtained:  Verbal   Consent given by:  Patient   Risks discussed:  Poor cosmetic result, poor wound healing and need for additional repair Universal protocol:    Imaging studies available: yes     Patient identity confirmed:  Verbally with patient and arm band Anesthesia:    Anesthesia method:  None Laceration details:    Location:  Scalp   Scalp location:  R parietal Exploration:    Hemostasis achieved with:  Direct pressure Treatment:    Area cleansed with:  Saline   Irrigation solution:  Sterile saline   Irrigation method:  Pressure wash Skin repair:    Repair method:  Tissue adhesive Approximation:    Approximation:  Close Repair type:    Repair type:  Simple Post-procedure details:    Dressing:  Open (no dressing)   Procedure completion:  Tolerated    Medications Ordered in ED Medications  Tdap (BOOSTRIX) injection 0.5 mL (0.5 mLs Intramuscular Given 05/20/21 1747)    ED Course/ Medical Decision Making/ A&P                           Medical Decision Making Amount and/or Complexity of Data Reviewed Radiology: ordered.  Risk Prescription drug management.   88 year old female presents emergency department with mechanical fall, head injury and scalp laceration.  Vital signs are stable on arrival, she is not anticoagulated.  No other recent illness.  No syncope or LOC.  CT imaging of the head and neck are unremarkable.  Scalp laceration was repaired with Dermabond.  Tetanus was  updated.  Patient at this time appears safe and stable for discharge and close outpatient follow up. Discharge plan and strict return to ED precautions discussed, patient verbalizes understanding and agreement.  She is accompanied by her aide who will take her home and care for her.        Final Clinical Impression(s) / ED Diagnoses Final diagnoses:  None    Rx / DC Orders ED Discharge Orders     None         Lorelle Gibbs, DO 05/20/21 2058

## 2021-05-20 NOTE — Discharge Instructions (Signed)
You have been seen and discharged from the emergency department.  Your CT imaging was normal.  Your tetanus was updated at this visit.  Your scalp cut was repaired with skin adhesive.  This will dissolve on its own over the next week, please refer to the information packet.  Follow-up with your primary provider for further evaluation and further care. Take home medications as prescribed. If you have any worsening symptoms or further concerns for your health please return to an emergency department for further evaluation. ?

## 2021-08-12 ENCOUNTER — Emergency Department (HOSPITAL_COMMUNITY): Payer: Medicare Other

## 2021-08-12 ENCOUNTER — Other Ambulatory Visit: Payer: Self-pay

## 2021-08-12 ENCOUNTER — Encounter (HOSPITAL_COMMUNITY): Payer: Self-pay

## 2021-08-12 ENCOUNTER — Inpatient Hospital Stay (HOSPITAL_COMMUNITY)
Admission: EM | Admit: 2021-08-12 | Discharge: 2021-08-17 | DRG: 689 | Disposition: A | Discharge status: Home Health | Payer: Medicare Other | Source: Skilled Nursing Facility | Attending: Internal Medicine | Admitting: Internal Medicine

## 2021-08-12 DIAGNOSIS — K5900 Constipation, unspecified: Secondary | ICD-10-CM | POA: Diagnosis present

## 2021-08-12 DIAGNOSIS — R7401 Elevation of levels of liver transaminase levels: Secondary | ICD-10-CM | POA: Diagnosis present

## 2021-08-12 DIAGNOSIS — J432 Centrilobular emphysema: Secondary | ICD-10-CM | POA: Diagnosis not present

## 2021-08-12 DIAGNOSIS — K559 Vascular disorder of intestine, unspecified: Secondary | ICD-10-CM | POA: Diagnosis not present

## 2021-08-12 DIAGNOSIS — F0280 Dementia in other diseases classified elsewhere without behavioral disturbance: Secondary | ICD-10-CM | POA: Diagnosis present

## 2021-08-12 DIAGNOSIS — J9601 Acute respiratory failure with hypoxia: Secondary | ICD-10-CM | POA: Diagnosis not present

## 2021-08-12 DIAGNOSIS — Z7982 Long term (current) use of aspirin: Secondary | ICD-10-CM

## 2021-08-12 DIAGNOSIS — R945 Abnormal results of liver function studies: Secondary | ICD-10-CM | POA: Diagnosis not present

## 2021-08-12 DIAGNOSIS — J8410 Pulmonary fibrosis, unspecified: Secondary | ICD-10-CM | POA: Diagnosis present

## 2021-08-12 DIAGNOSIS — J479 Bronchiectasis, uncomplicated: Secondary | ICD-10-CM | POA: Diagnosis not present

## 2021-08-12 DIAGNOSIS — K824 Cholesterolosis of gallbladder: Secondary | ICD-10-CM | POA: Diagnosis present

## 2021-08-12 DIAGNOSIS — N390 Urinary tract infection, site not specified: Principal | ICD-10-CM | POA: Diagnosis present

## 2021-08-12 DIAGNOSIS — E869 Volume depletion, unspecified: Secondary | ICD-10-CM | POA: Diagnosis not present

## 2021-08-12 DIAGNOSIS — K7200 Acute and subacute hepatic failure without coma: Secondary | ICD-10-CM | POA: Diagnosis not present

## 2021-08-12 DIAGNOSIS — I480 Paroxysmal atrial fibrillation: Secondary | ICD-10-CM | POA: Diagnosis not present

## 2021-08-12 DIAGNOSIS — Z66 Do not resuscitate: Secondary | ICD-10-CM | POA: Diagnosis not present

## 2021-08-12 DIAGNOSIS — Z79899 Other long term (current) drug therapy: Secondary | ICD-10-CM

## 2021-08-12 DIAGNOSIS — I700 Atherosclerosis of aorta: Secondary | ICD-10-CM | POA: Diagnosis not present

## 2021-08-12 DIAGNOSIS — I10 Essential (primary) hypertension: Secondary | ICD-10-CM | POA: Diagnosis not present

## 2021-08-12 DIAGNOSIS — K7689 Other specified diseases of liver: Secondary | ICD-10-CM | POA: Diagnosis not present

## 2021-08-12 DIAGNOSIS — Z8673 Personal history of transient ischemic attack (TIA), and cerebral infarction without residual deficits: Secondary | ICD-10-CM

## 2021-08-12 DIAGNOSIS — K5730 Diverticulosis of large intestine without perforation or abscess without bleeding: Secondary | ICD-10-CM | POA: Diagnosis not present

## 2021-08-12 DIAGNOSIS — W1830XA Fall on same level, unspecified, initial encounter: Secondary | ICD-10-CM | POA: Diagnosis not present

## 2021-08-12 DIAGNOSIS — B964 Proteus (mirabilis) (morganii) as the cause of diseases classified elsewhere: Secondary | ICD-10-CM | POA: Diagnosis not present

## 2021-08-12 DIAGNOSIS — G309 Alzheimer's disease, unspecified: Secondary | ICD-10-CM | POA: Diagnosis present

## 2021-08-12 DIAGNOSIS — Z888 Allergy status to other drugs, medicaments and biological substances status: Secondary | ICD-10-CM | POA: Diagnosis not present

## 2021-08-12 DIAGNOSIS — Z87891 Personal history of nicotine dependence: Secondary | ICD-10-CM

## 2021-08-12 DIAGNOSIS — E785 Hyperlipidemia, unspecified: Secondary | ICD-10-CM | POA: Diagnosis not present

## 2021-08-12 DIAGNOSIS — Z8249 Family history of ischemic heart disease and other diseases of the circulatory system: Secondary | ICD-10-CM

## 2021-08-12 DIAGNOSIS — R7989 Other specified abnormal findings of blood chemistry: Secondary | ICD-10-CM | POA: Diagnosis present

## 2021-08-12 DIAGNOSIS — S0083XA Contusion of other part of head, initial encounter: Secondary | ICD-10-CM | POA: Diagnosis present

## 2021-08-12 DIAGNOSIS — N281 Cyst of kidney, acquired: Secondary | ICD-10-CM | POA: Diagnosis not present

## 2021-08-12 DIAGNOSIS — W19XXXA Unspecified fall, initial encounter: Secondary | ICD-10-CM | POA: Diagnosis not present

## 2021-08-12 DIAGNOSIS — Z043 Encounter for examination and observation following other accident: Secondary | ICD-10-CM | POA: Diagnosis not present

## 2021-08-12 DIAGNOSIS — R531 Weakness: Secondary | ICD-10-CM

## 2021-08-12 DIAGNOSIS — K449 Diaphragmatic hernia without obstruction or gangrene: Secondary | ICD-10-CM | POA: Diagnosis not present

## 2021-08-12 LAB — CBC WITH DIFFERENTIAL/PLATELET
Abs Immature Granulocytes: 0.06 10*3/uL (ref 0.00–0.07)
Basophils Absolute: 0.1 10*3/uL (ref 0.0–0.1)
Basophils Relative: 1 %
Eosinophils Absolute: 0.1 10*3/uL (ref 0.0–0.5)
Eosinophils Relative: 1 %
HCT: 41.6 % (ref 36.0–46.0)
Hemoglobin: 13.3 g/dL (ref 12.0–15.0)
Immature Granulocytes: 1 %
Lymphocytes Relative: 12 %
Lymphs Abs: 1.1 10*3/uL (ref 0.7–4.0)
MCH: 27.5 pg (ref 26.0–34.0)
MCHC: 32 g/dL (ref 30.0–36.0)
MCV: 86.1 fL (ref 80.0–100.0)
Monocytes Absolute: 0.7 10*3/uL (ref 0.1–1.0)
Monocytes Relative: 8 %
Neutro Abs: 6.8 10*3/uL (ref 1.7–7.7)
Neutrophils Relative %: 77 %
Platelets: 224 10*3/uL (ref 150–400)
RBC: 4.83 MIL/uL (ref 3.87–5.11)
RDW: 13.8 % (ref 11.5–15.5)
WBC: 8.8 10*3/uL (ref 4.0–10.5)
nRBC: 0 % (ref 0.0–0.2)

## 2021-08-12 LAB — COMPREHENSIVE METABOLIC PANEL WITH GFR
ALT: 1256 U/L — ABNORMAL HIGH (ref 0–44)
AST: 1027 U/L — ABNORMAL HIGH (ref 15–41)
Albumin: 3.7 g/dL (ref 3.5–5.0)
Alkaline Phosphatase: 190 U/L — ABNORMAL HIGH (ref 38–126)
Anion gap: 7 (ref 5–15)
BUN: 26 mg/dL — ABNORMAL HIGH (ref 8–23)
CO2: 26 mmol/L (ref 22–32)
Calcium: 9.2 mg/dL (ref 8.9–10.3)
Chloride: 108 mmol/L (ref 98–111)
Creatinine, Ser: 0.73 mg/dL (ref 0.44–1.00)
GFR, Estimated: >60 mL/min
Glucose, Bld: 150 mg/dL — ABNORMAL HIGH (ref 70–99)
Potassium: 3.5 mmol/L (ref 3.5–5.1)
Sodium: 141 mmol/L (ref 135–145)
Total Bilirubin: 2 mg/dL — ABNORMAL HIGH (ref 0.3–1.2)
Total Protein: 7.4 g/dL (ref 6.5–8.1)

## 2021-08-12 LAB — LIPASE, BLOOD: Lipase: 37 U/L (ref 11–51)

## 2021-08-12 NOTE — ED Triage Notes (Signed)
Pt arrived via EMS from [facility] caregiver reports a mechanical ground level fall when pt tried to get oob. Pt denies hitting her head or LOC. Pt denies any pain.

## 2021-08-12 NOTE — ED Notes (Signed)
Fall risk precautions implemented, fall bed alarm, wristband, socks, increased rounding, caregiver at bedside.

## 2021-08-12 NOTE — ED Provider Notes (Signed)
[facility] DEPT Provider Note   CSN: [id number] Arrival date & time: 08/12/21  2117     History {Add pertinent medical, surgical, social history, OB history to HPI:1} Chief Complaint  Patient presents with   Melinda Edwards is a 88 y.o. female with a history of dementia, Alzheimer's, stroke, A-fib, pulmonary fibrosis, hypertension, and hyperlipidemia who presents to the emergency department status post unwitnessed fall.  Patient states she reports falling, she is not really sure what happened, per facility to her caregiver she was trying to get out of bed.  Unknown head injury.  Patient does not believe that she lost consciousness.  She denies pain.  Her caregiver at bedside provides additional history, states that yesterday after eating tuna the patient had multiple episodes of emesis, with subsequently able to tolerate p.o., did have some coughing throughout the night.  They have not noted any fevers, increased work of breathing, or hemoptysis.  Patient denies headache, neck pain, chest pain, abdominal pain, or dyspnea.  Patient is not on blood thinners.  HPI     Home Medications Prior to Admission medications   Medication Sig Start Date End Date Taking? Authorizing Provider  acetaminophen (TYLENOL) 500 MG tablet 2 tablets as needed    [provider]  aspirin EC 81 MG tablet Take 81 mg by mouth daily. Swallow whole.    [provider]  atorvastatin (LIPITOR) 10 MG tablet Take 1 tablet (10 mg total) by mouth daily. 10/19/13   Darlin Coco, MD  b complex vitamins capsule Take 1 capsule by mouth daily.    [provider]  cephALEXin (KEFLEX) 250 MG capsule Take by mouth 4 (four) times daily.    [provider]  cholecalciferol (VITAMIN D) 25 MCG (1000 UNIT) tablet Take 1,000 Units by mouth daily.    [provider]  diltiazem (CARDIZEM CD) 120 MG 24 hr capsule Take 1 capsule (120 mg total) by mouth  daily. 05/14/19 10/07/20  Charlynne Cousins, MD  Ferrous Fumarate (HEMOCYTE - 106 MG FE) 324 (106 Fe) MG TABS tablet Take 1 tablet by mouth in the morning and at bedtime.    [provider]  hydrocortisone cream 1 % Apply 1 application topically as needed for itching.    [provider]  mirtazapine (REMERON) 15 MG tablet Take 15 mg by mouth at bedtime.    [provider]  ondansetron (ZOFRAN) 4 MG tablet Take 1 tablet (4 mg total) by mouth every 8 (eight) hours as needed for nausea or vomiting. 05/20/21   Horton, Alvin Critchley, DO  polyethylene glycol (MIRALAX / GLYCOLAX) 17 g packet Take 17 g by mouth daily as needed for mild constipation. 03/21/20   Alcus Dad, MD  potassium chloride SA (K-DUR,KLOR-CON) 20 MEQ tablet Take 1 tablet (20 mEq total) by mouth daily. 10/19/13   Darlin Coco, MD  Probiotic Product (PROBIOTIC-10 PO) Take by mouth.    [provider]      Allergies    Antihistamines, loratadine-type; Loratadine; and Namenda [memantine]    Review of Systems   Review of Systems  Constitutional:  Negative for chills and fever.  Respiratory:  Positive for cough. Negative for shortness of breath.   Cardiovascular:  Negative for chest pain.  Gastrointestinal:  Positive for nausea and vomiting. Negative for abdominal pain and blood in stool.  Genitourinary:  Negative for dysuria.  Musculoskeletal:  Negative for back pain and neck pain.  Neurological:  Negative  for syncope.  All other systems reviewed and are negative.  Physical Exam Updated Vital Signs BP 137/76   Pulse 81   Temp 98.4 F (36.9 C) (Oral)   Resp (!) 21   SpO2 96%  Physical Exam Vitals and nursing note reviewed.  Constitutional:      General: She is not in acute distress.    Appearance: She is not toxic-appearing.  HENT:     Head: Normocephalic and atraumatic.     Comments: Skin lesion to forehead, reportedly dermatology following. No raccoon eyes or battle sign.  No facial  bony tenderness.    Ears:     Comments: No hemotympanums.    Nose: Nose normal.  Neck:     Comments: C-collar in place. Cardiovascular:     Rate and Rhythm: Normal rate and regular rhythm.     Comments: 2+ radial and DP pulses bilaterally. Pulmonary:     Effort: Pulmonary effort is normal. No respiratory distress.  Chest:     Chest wall: No tenderness.  Abdominal:     General: There is no distension.     Palpations: Abdomen is soft.     Tenderness: There is abdominal tenderness (Periumbilical and suprapubic). There is no guarding or rebound.  Musculoskeletal:     Comments: Upper extremities: Patient actively ranging at the shoulders, elbows, wrist, and all digits.  No focal bony tenderness. Back: No midline tenderness Lower extremities: Able to lift bilateral lower extremities off of the stretcher without difficulty, able to fully flex the knees in extension, move the ankle in all digits.  No focal bony tenderness to palpation.  Skin:    General: Skin is warm and dry.  Neurological:     Mental Status: She is alert.     Comments: Alert.  Sensation grossly tact bilateral upper and lower extremities.  5/5 symmetric grip strength and strength with plantar and dorsiflexion bilaterally.  Psychiatric:        Mood and Affect: Mood normal.    ED Results / Procedures / Treatments   Labs (all labs ordered are listed, but only abnormal results are displayed) Labs Reviewed  URINE CULTURE  COMPREHENSIVE METABOLIC PANEL  CBC WITH DIFFERENTIAL/PLATELET  LIPASE, BLOOD  URINALYSIS, ROUTINE W REFLEX MICROSCOPIC    EKG None  Radiology No results found.  Procedures Procedures  {Document cardiac monitor, telemetry assessment procedure when appropriate:1}  Medications Ordered in ED Medications - No data to display  ED Course/ Medical Decision Making/ A&P                           Medical Decision Making Amount and/or Complexity of Data Reviewed Labs: ordered. Radiology:  ordered.   Patient presents to the emergency department status post unwitnessed fall at home.  Nontoxic, vitals with mildly elevated blood pressure.  Also had multiple episodes of emesis yesterday after eating and some coughing throughout the night.  Based on history and exam plan for abdominal labs and will obtain CT head, C-spine, chest/abdomen/pelvis.  Discussed with attending, in agreement.  Discussed with patient's caregiver for additional history.  Chart reviewed.  I ordered, viewed, and interpreted labs including: CBC: Grossly unremarkable, normal platelet count CMP: Significant transaminitis with AST 1027, ALT 1256, elevated alk phos and total bilirubin.  Renal function preserved with normal creatinine, however BUN is mildly elevated Lipase: Within normal limits  Given patient's elevated LFTs we will check a PT/INR, seen vitamin level, and ethanol level.  CT  scan including CT abdomen pelvis pending.    {Document critical care time when appropriate:1} {Document review of labs and clinical decision tools ie heart score, Chads2Vasc2 etc:1}  {Document your independent review of radiology images, and any outside records:1} {Document your discussion with family members, caretakers, and with consultants:1} {Document social determinants of health affecting pt's care:1} {Document your decision making why or why not admission, treatments were needed:1} Final Clinical Impression(s) / ED Diagnoses Final diagnoses:  None    Rx / DC Orders ED Discharge Orders     None

## 2021-08-13 ENCOUNTER — Emergency Department (HOSPITAL_COMMUNITY): Payer: Medicare Other

## 2021-08-13 ENCOUNTER — Ambulatory Visit (HOSPITAL_BASED_OUTPATIENT_CLINIC_OR_DEPARTMENT_OTHER): Payer: Medicare PPO | Admitting: Nurse Practitioner

## 2021-08-13 ENCOUNTER — Encounter (HOSPITAL_COMMUNITY): Payer: Self-pay | Admitting: Internal Medicine

## 2021-08-13 ENCOUNTER — Other Ambulatory Visit (HOSPITAL_COMMUNITY): Payer: Medicare PPO

## 2021-08-13 DIAGNOSIS — J479 Bronchiectasis, uncomplicated: Secondary | ICD-10-CM | POA: Diagnosis not present

## 2021-08-13 DIAGNOSIS — R7401 Elevation of levels of liver transaminase levels: Secondary | ICD-10-CM

## 2021-08-13 DIAGNOSIS — J432 Centrilobular emphysema: Secondary | ICD-10-CM | POA: Diagnosis not present

## 2021-08-13 DIAGNOSIS — K824 Cholesterolosis of gallbladder: Secondary | ICD-10-CM | POA: Diagnosis not present

## 2021-08-13 DIAGNOSIS — N281 Cyst of kidney, acquired: Secondary | ICD-10-CM | POA: Diagnosis not present

## 2021-08-13 DIAGNOSIS — N390 Urinary tract infection, site not specified: Secondary | ICD-10-CM

## 2021-08-13 DIAGNOSIS — Z043 Encounter for examination and observation following other accident: Secondary | ICD-10-CM | POA: Diagnosis not present

## 2021-08-13 DIAGNOSIS — R945 Abnormal results of liver function studies: Secondary | ICD-10-CM | POA: Diagnosis not present

## 2021-08-13 DIAGNOSIS — I700 Atherosclerosis of aorta: Secondary | ICD-10-CM | POA: Diagnosis not present

## 2021-08-13 DIAGNOSIS — K7689 Other specified diseases of liver: Secondary | ICD-10-CM | POA: Diagnosis not present

## 2021-08-13 DIAGNOSIS — E869 Volume depletion, unspecified: Secondary | ICD-10-CM | POA: Diagnosis present

## 2021-08-13 DIAGNOSIS — K449 Diaphragmatic hernia without obstruction or gangrene: Secondary | ICD-10-CM | POA: Diagnosis not present

## 2021-08-13 DIAGNOSIS — W19XXXA Unspecified fall, initial encounter: Secondary | ICD-10-CM | POA: Diagnosis not present

## 2021-08-13 DIAGNOSIS — K5900 Constipation, unspecified: Secondary | ICD-10-CM | POA: Diagnosis present

## 2021-08-13 DIAGNOSIS — K5730 Diverticulosis of large intestine without perforation or abscess without bleeding: Secondary | ICD-10-CM | POA: Diagnosis not present

## 2021-08-13 HISTORY — DX: Urinary tract infection, site not specified: N39.0

## 2021-08-13 LAB — CBC WITH DIFFERENTIAL/PLATELET
Abs Immature Granulocytes: 0.07 10*3/uL (ref 0.00–0.07)
Basophils Absolute: 0 10*3/uL (ref 0.0–0.1)
Basophils Relative: 0 %
Eosinophils Absolute: 0.1 10*3/uL (ref 0.0–0.5)
Eosinophils Relative: 1 %
HCT: 42.2 % (ref 36.0–46.0)
Hemoglobin: 13.4 g/dL (ref 12.0–15.0)
Immature Granulocytes: 1 %
Lymphocytes Relative: 10 %
Lymphs Abs: 1 10*3/uL (ref 0.7–4.0)
MCH: 27.5 pg (ref 26.0–34.0)
MCHC: 31.8 g/dL (ref 30.0–36.0)
MCV: 86.5 fL (ref 80.0–100.0)
Monocytes Absolute: 0.8 10*3/uL (ref 0.1–1.0)
Monocytes Relative: 8 %
Neutro Abs: 7.7 10*3/uL (ref 1.7–7.7)
Neutrophils Relative %: 80 %
Platelets: 221 10*3/uL (ref 150–400)
RBC: 4.88 MIL/uL (ref 3.87–5.11)
RDW: 13.9 % (ref 11.5–15.5)
WBC: 9.7 10*3/uL (ref 4.0–10.5)
nRBC: 0 % (ref 0.0–0.2)

## 2021-08-13 LAB — URINALYSIS, ROUTINE W REFLEX MICROSCOPIC
Bilirubin Urine: NEGATIVE
Glucose, UA: NEGATIVE mg/dL
Hgb urine dipstick: NEGATIVE
Ketones, ur: NEGATIVE mg/dL
Nitrite: NEGATIVE
Protein, ur: 100 mg/dL — AB
Specific Gravity, Urine: 1.043 — ABNORMAL HIGH (ref 1.005–1.030)
pH: 9 — ABNORMAL HIGH (ref 5.0–8.0)

## 2021-08-13 LAB — COMPREHENSIVE METABOLIC PANEL
ALT: 1079 U/L — ABNORMAL HIGH (ref 0–44)
AST: 722 U/L — ABNORMAL HIGH (ref 15–41)
Albumin: 3.5 g/dL (ref 3.5–5.0)
Alkaline Phosphatase: 180 U/L — ABNORMAL HIGH (ref 38–126)
Anion gap: 9 (ref 5–15)
BUN: 22 mg/dL (ref 8–23)
CO2: 25 mmol/L (ref 22–32)
Calcium: 9.1 mg/dL (ref 8.9–10.3)
Chloride: 109 mmol/L (ref 98–111)
Creatinine, Ser: 0.66 mg/dL (ref 0.44–1.00)
GFR, Estimated: >60 mL/min (ref >60)
Glucose, Bld: 123 mg/dL — ABNORMAL HIGH (ref 70–99)
Potassium: 3.7 mmol/L (ref 3.5–5.1)
Sodium: 143 mmol/L (ref 135–145)
Total Bilirubin: 1.6 mg/dL — ABNORMAL HIGH (ref 0.3–1.2)
Total Protein: 7.1 g/dL (ref 6.5–8.1)

## 2021-08-13 LAB — ACETAMINOPHEN LEVEL: Acetaminophen (Tylenol), Serum: <10 ug/mL — ABNORMAL LOW (ref 10–30)

## 2021-08-13 LAB — BILIRUBIN, DIRECT: Bilirubin, Direct: 0.6 mg/dL — ABNORMAL HIGH (ref 0.0–0.2)

## 2021-08-13 LAB — ETHANOL: Alcohol, Ethyl (B): <10 mg/dL (ref <10)

## 2021-08-13 LAB — MAGNESIUM
Magnesium: 1.8 mg/dL (ref 1.7–2.4)
Magnesium: 2.4 mg/dL (ref 1.7–2.4)

## 2021-08-13 LAB — PROTIME-INR
INR: 1.1 (ref 0.8–1.2)
INR: 1.2 (ref 0.8–1.2)
Prothrombin Time: 14.1 seconds (ref 11.4–15.2)
Prothrombin Time: 15 seconds (ref 11.4–15.2)

## 2021-08-13 LAB — GAMMA GT: GGT: 135 U/L — ABNORMAL HIGH (ref 7–50)

## 2021-08-13 MED ORDER — FENTANYL CITRATE PF 50 MCG/ML IJ SOSY: 25.0000 ug | PREFILLED_SYRINGE | INTRAMUSCULAR | Status: DC | PRN

## 2021-08-13 MED ORDER — ASPIRIN 81 MG PO TBEC
81.0000 mg | DELAYED_RELEASE_TABLET | Freq: Every day | ORAL | Status: DC
  Administered 2021-08-13 – 2021-08-17 (×5): 81 mg via ORAL
  Filled 2021-08-13 (×5): qty 1

## 2021-08-13 MED ORDER — SODIUM CHLORIDE 0.9 % IV SOLN
1.0000 g | INTRAVENOUS | Status: DC
  Administered 2021-08-13 – 2021-08-14 (×2): 1 g via INTRAVENOUS
  Filled 2021-08-13 (×2): qty 10

## 2021-08-13 MED ORDER — MAGNESIUM OXIDE -MG SUPPLEMENT 400 (240 MG) MG PO TABS
200.0000 mg | ORAL_TABLET | Freq: Every day | ORAL | Status: DC
  Administered 2021-08-13 – 2021-08-17 (×5): 200 mg via ORAL
  Filled 2021-08-13 (×5): qty 1

## 2021-08-13 MED ORDER — ONDANSETRON HCL 4 MG/2ML IJ SOLN: 4.0000 mg | Freq: Four times a day (QID) | INTRAMUSCULAR | Status: DC | PRN

## 2021-08-13 MED ORDER — DILTIAZEM HCL ER COATED BEADS 120 MG PO CP24
120.0000 mg | ORAL_CAPSULE | Freq: Every day | ORAL | Status: DC
  Administered 2021-08-13 – 2021-08-17 (×5): 120 mg via ORAL
  Filled 2021-08-13 (×5): qty 1

## 2021-08-13 MED ORDER — MIRTAZAPINE 15 MG PO TABS
15.0000 mg | ORAL_TABLET | Freq: Every day | ORAL | Status: DC
  Administered 2021-08-13 – 2021-08-16 (×4): 15 mg via ORAL
  Filled 2021-08-13 (×5): qty 1

## 2021-08-13 MED ORDER — LACTATED RINGERS IV SOLN: INTRAVENOUS | Status: AC

## 2021-08-13 MED ORDER — POTASSIUM CHLORIDE CRYS ER 20 MEQ PO TBCR
20.0000 meq | EXTENDED_RELEASE_TABLET | Freq: Once | ORAL | Status: AC
Start: 2021-08-13 — End: 2021-08-13
  Administered 2021-08-13: 20 meq via ORAL
  Filled 2021-08-13: qty 1

## 2021-08-13 MED ORDER — ACETAMINOPHEN 325 MG PO TABS
650.0000 mg | ORAL_TABLET | Freq: Four times a day (QID) | ORAL | Status: DC | PRN
  Administered 2021-08-15 – 2021-08-16 (×2): 650 mg via ORAL
  Filled 2021-08-13 (×2): qty 2

## 2021-08-13 MED ORDER — NALOXONE HCL 0.4 MG/ML IJ SOLN: 0.4000 mg | INTRAMUSCULAR | Status: DC | PRN

## 2021-08-13 MED ORDER — ACETAMINOPHEN 650 MG RE SUPP: 650.0000 mg | Freq: Four times a day (QID) | RECTAL | Status: DC | PRN

## 2021-08-13 MED ORDER — IOHEXOL 300 MG/ML  SOLN
100.0000 mL | Freq: Once | INTRAMUSCULAR | Status: AC | PRN
  Administered 2021-08-13: 100 mL via INTRAVENOUS

## 2021-08-13 MED ORDER — IPRATROPIUM-ALBUTEROL 0.5-2.5 (3) MG/3ML IN SOLN: 3.0000 mL | Freq: Four times a day (QID) | RESPIRATORY_TRACT | Status: DC | PRN

## 2021-08-13 MED ORDER — LACTATED RINGERS IV BOLUS
500.0000 mL | Freq: Once | INTRAVENOUS | Status: AC
  Administered 2021-08-13: 500 mL via INTRAVENOUS

## 2021-08-13 NOTE — Progress Notes (Signed)
  Carryover admission to the Day Admitter.  I discussed this case with the EDP, Kennith Maes, PA.  Per these discussions:  This is a 88 year old female with history of paroxysmal atrial fibrillation not on chronic anticoagulation, dementia, who presents from assisted living facility complaining of a few days of generalized weakness in the setting of new onset abdominal pain as well as new onset nonbloody, nonbilious nausea/vomiting starting yesterday.  She was also noted to have a fall at her assisted living facility, that was unwitnessed, without any overt loss of consciousness.  This fall potentially to place the patient was attempting to get out of bed.   Presenting labs reportedly notable for acute transaminitis, with CT abdomen/pelvis as well as right upper quadrant ultrasound showing evidence of nonobstructive gallbladder polyp, but otherwise no evidence of acute intra-abdominal process, including no evidence of obstructing stone.   Per chart review, including review of MOST on file (from April '22), the patient is DNR/DNI, and wishes comfort care measures only, with MOST also noting that the patient would not want IV fluids.  However, in the setting of her new onset abdominal pain associated nausea/vomiting she is being admitted for symptomatic management of these new symptoms.   I have placed an order for overnight observation for symptomatic management of abdominal pain as well as nausea/vomiting.   I have placed some additional preliminary admit orders via the adult multi-morbid admission order set. I have also ordered prn IV fentanyl, prn Zofran.  I placed orders for DNR/DNI although I have not placed formal orders for comfort care measures only, pending confirmation of these wishes.     Babs Bertin, DO Hospitalist

## 2021-08-13 NOTE — H&P (Signed)
History and Physical    Patient: Melinda Edwards FKC:[id number] DOB: 10/19/1926 DOA: 08/12/2021 DOS: the patient was seen and examined on 08/13/2021 PCP: Lajean Manes, MD  Patient coming from: Home  Chief Complaint:  Chief Complaint  Patient presents with   Fall   HPI: Melinda Edwards is a 88 y.o. female with medical history significant of Alzheimer's dementia, paroxysmal atrial fibrillation, hypertension, pulmonary fibrosis, bronchiectasis, pulmonary nodule, history of other nonhemorrhagic stroke, mild aortic stenosis who had a fall at her residential facility and was brought to the emergency department.  Work-up revealed transaminitis with hyperbilirubinemia.  The patient's caregiver Engineer, maintenance) provided history is stating that the patient ate from a tuna casserole 2 days ago, did not feel well and had 2 episodes of emesis afterwards. Two other residents that ate from the same casserole subsequently developed diarrhea.  She is unable to provide further history, but able to answer simple questions.  No headache, chest, abdominal or back pain at this time.  ED course: Initial vital signs were temperature 98.4 F, pulse 83, respiration 18, BP 147/79 mmHg O2 sat 95% on room air.  Lab work: Her urinalysis showed Cloudy in appearance, increase of specific gravity of 1.043, elevated pH of 9.0, proteinuria 100 mg/dL, moderate leukocyte esterase, 21-50 WBC with many bacteria with presence of mucus and triple phosphate crystals.  Her CBC, PT and INR were normal.  Lipase unremarkable.  CMP with normal electrolytes, creatinine, calcium, total protein and albumin level.  Total bilirubin was 2.0, glucose of 150 and BUN of 26 mg/dL.  Transaminases show an AST of 1027 and ALT of 1256 units/L.  Mild elevation of alkaline phosphatase at 190 units/L.  Alcohol and acetaminophen levels are normal.  Magnesium was 1.8 mg/dL.  Imaging: CT head with no acute intracranial abnormality.  C-spine CT with no traumatic  injury.  CT chest now shows new 9 mm right lower lobe pulmonary nodule with follow-up recommended in 6 to 12 months if clinically warranted.  Ultrasound of the gallbladder showed a 1.2 cm polyp with recommendations to follow-up in 6 months.  She also has hepatic cysts.   Review of Systems: As mentioned in the history of present illness. All other systems reviewed and are negative. Past Medical History:  Diagnosis Date   Alzheimer's dementia [facility])    Atrial fibrillation ([place]) 05/14/2019   Hyperlipidemia    Hypertension    PAC (premature atrial contraction)    BENIGH   Palpitations    Pulmonary fibrosis ([place])    Pulmonary nodule    Stroke ([place]) 05/10/2019   Stroke ([place]) 06/16/2019   Past Surgical History:  Procedure Laterality Date   BREAST EXCISIONAL BIOPSY Right 2003   DILATION AND CURETTAGE OF UTERUS     Social History:  reports that she quit smoking about 26 years ago. Her smoking use included cigarettes. She smoked an average of 1 pack per day. She has never used smokeless tobacco. She reports that she does not drink alcohol and does not use drugs.  Allergies  Allergen Reactions   Antihistamines, Loratadine-Type Other (See Comments)    These cause nose bleeds   Loratadine Other (See Comments)    Caused a nose bleed   Namenda [Memantine] Other (See Comments)    Agitation    Family History  Problem Relation Age of Onset   Heart disease Other    Heart attack Brother        x 3   Heart failure Mother  chf    Prior to Admission medications   Medication Sig Start Date End Date Taking? Authorizing Provider  aspirin EC 81 MG tablet Take 81 mg by mouth daily. Swallow whole.   Yes [provider]  b complex vitamins capsule Take 1 capsule by mouth daily.   Yes [provider]  cholecalciferol (VITAMIN D) 25 MCG (1000 UNIT) tablet Take 1,000 Units by mouth daily.   Yes [provider]  diltiazem (CARDIZEM CD) 120 MG 24 hr capsule Take 1 capsule  (120 mg total) by mouth daily. 05/14/19 08/12/21 Yes Charlynne Cousins, MD  mirtazapine (REMERON) 15 MG tablet Take 15 mg by mouth at bedtime.   Yes [provider]  Probiotic Product (PROBIOTIC-10 PO) Take by mouth.   Yes [provider]  atorvastatin (LIPITOR) 10 MG tablet Take 1 tablet (10 mg total) by mouth daily. Patient not taking: Reported on 08/12/2021 10/19/13   Darlin Coco, MD  ondansetron (ZOFRAN) 4 MG tablet Take 1 tablet (4 mg total) by mouth every 8 (eight) hours as needed for nausea or vomiting. Patient not taking: Reported on 08/12/2021 05/20/21   Horton, Drue Dun M, DO  polyethylene glycol (MIRALAX / GLYCOLAX) 17 g packet Take 17 g by mouth daily as needed for mild constipation. Patient not taking: Reported on 08/12/2021 03/21/20   Alcus Dad, MD  potassium chloride SA (K-DUR,KLOR-CON) 20 MEQ tablet Take 1 tablet (20 mEq total) by mouth daily. Patient not taking: Reported on 08/12/2021 10/19/13   Darlin Coco, MD    Physical Exam: Vitals:   08/13/21 0044 08/13/21 0100 08/13/21 0257 08/13/21 0404  BP: (!) 142/98 140/74 132/64 140/89  Pulse: 83 89 80 82  Resp: '16 19 18 16  '$ Temp:   98.8 F (37.1 C) 97.8 F (36.6 C)  TempSrc:   Oral Oral  SpO2: 94% 91% 92% 97%  Weight:    61.5 kg  Height:    5' 4.5" (1.638 m)   Physical Exam Vitals and nursing note reviewed.  Constitutional:      Appearance: Normal appearance. She is normal weight.  HENT:     Head: Normocephalic.     Mouth/Throat:     Mouth: Mucous membranes are moist.  Eyes:     General: No scleral icterus.    Pupils: Pupils are equal, round, and reactive to light.  Neck:     Vascular: No JVD.  Cardiovascular:     Rate and Rhythm: Normal rate and regular rhythm.  Pulmonary:     Effort: Pulmonary effort is normal.     Breath sounds: Normal breath sounds. No decreased breath sounds, wheezing, rhonchi or rales.  Abdominal:     General: Bowel sounds are normal. There is no distension.      Palpations: Abdomen is soft.     Tenderness: There is no abdominal tenderness. There is no right CVA tenderness or left CVA tenderness.  Musculoskeletal:     Cervical back: Neck supple.     Right lower leg: No edema.     Left lower leg: No edema.  Skin:    General: Skin is warm and dry.  Neurological:     General: No focal deficit present.     Mental Status: She is alert. Mental status is at baseline. She is disoriented.  Psychiatric:        Mood and Affect: Mood normal.        Behavior: Behavior normal.    Data Reviewed:  There are no new results  to review at this time.  Assessment and Plan: Principal Problem:   Transaminitis Observation/telemetry. Clear liquid diet. Antiemetics as needed. Avoid hepatotoxic medications. Follow-up hepatic function closely. Follow-up pending hepatitis A result. Gastroenterology consult appreciated.  Active Problems:   Acute UTI (urinary tract infection) Begin Rocephin 1 g IVPB daily. Follow-up urine culture and sensitivity.    Volume depletion Urine showing increased specific gravity. Small LR bolus and time-limited IV hydration ordered.    Bronchiectasis without acute exacerbation (HCC) Supplemental oxygen and bronchodilators as needed.    Hyperlipidemia No longer on atorvastatin. Contraindicated at this time.    Essential hypertension Continue diltiazem 180 mg p.o. daily. Monitor blood pressure and heart rate.    Paroxysmal atrial fibrillation (HCC) CHA?DS?-VASc Score of at least 5. Not on anticoagulation. Continue low-dose aspirin. Continue diltiazem for rate control.    Alzheimer's dementia ([place]) Supportive care. Continue Remeron at bedtime.    Constipation No longer on MiraLAX. Low-dose mag oxide daily. Other measures as needed.     Advance Care Planning:   Code Status: DNR   Consults: [facility] (Dr. Alessandra Bevels).  Family Communication:   Severity of Illness: The appropriate patient status for this patient  is OBSERVATION. Observation status is judged to be reasonable and necessary in order to provide the required intensity of service to ensure the patient's safety. The patient's presenting symptoms, physical exam findings, and initial radiographic and laboratory data in the context of their medical condition is felt to place them at decreased risk for further clinical deterioration. Furthermore, it is anticipated that the patient will be medically stable for discharge from the hospital within 2 midnights of admission.   Author: Reubin Milan, MD 08/13/2021 7:41 AM  For on call review [email or web address].   This document was prepared in [employer] and may contain some unintended transcription errors.

## 2021-08-13 NOTE — ED Notes (Signed)
Placed pt on 2 L/M nasal cannula due to SpO2 readings room air were 86-89%, with pt resting. Pt now reading 94%

## 2021-08-13 NOTE — Care Management Obs Status (Signed)
[facility] NOTIFICATION   Patient Details  Name: Melinda Edwards MRN: [id number] Date of Birth: Jul 18, 1926   Medicare Observation Status Notification Given:  Yes    Dessa Phi, RN 08/13/2021, 12:24 PM

## 2021-08-13 NOTE — TOC Initial Note (Signed)
Transition of Care [facility]) - Initial/Assessment Note    Patient Details  Name: Melinda Edwards MRN: [id number] Date of Birth: 1926-09-28  [facility] CM/SW Contact:    Dessa Phi, RN Phone Number: 08/13/2021, 12:27 PM  Clinical Narrative: From [place] [facility] to return. Has own transport by caregiver. Monitor on 02. PT cons. Facility has own HHPT.                  Expected Discharge Plan: Assisted Living Barriers to Discharge: Continued Medical Work up   Patient Goals and CMS Choice Patient states their goals for this hospitalization and ongoing recovery are:: [facility] to [facility] CMS Medicare.gov Compare Post Acute Care list provided to:: Patient Represenative (must comment) ([facility](caregiver)/facility) Choice offered to / list presented to :  (caregiver)  Expected Discharge Plan and Services Expected Discharge Plan: Assisted Living   Discharge Planning Services: CM Consult Post Acute Care Choice:  (ALF) Living arrangements for the past 2 months: [place]                                      Prior Living Arrangements/Services Living arrangements for the past 2 months: [place] Lives with:: Facility Resident Patient language and need for interpreter reviewed:: Yes Do you feel safe going back to the place where you live?: Yes      Need for Family Participation in Patient Care: Yes (Comment) Care giver support system in place?: Yes (comment) Current home services: Homehealth aide, DME (private caregiver;rw) Criminal Activity/Legal Involvement Pertinent to Current Situation/Hospitalization: No - Comment as needed  Activities of Daily Living   ADL Screening (condition at time of admission) Patient's cognitive ability adequate to safely complete daily activities?: No Is the patient deaf or have difficulty hearing?: Yes Does the patient have difficulty concentrating, remembering, or making  decisions?: Yes Independently performs ADLs?: No  Permission Sought/Granted Permission sought to share information with : Case Manager Permission granted to share information with : Yes, Verbal Permission Granted  Share Information with NAME: Case Manager           Emotional Assessment Appearance:: Appears stated age Attitude/Demeanor/Rapport: Gracious Affect (typically observed): Accepting Orientation: : Oriented to Self Alcohol / Substance Use: Not Applicable Psych Involvement: No (comment)  Admission diagnosis:  Transaminitis [R74.01] Elevated LFTs [R79.89] Generalized weakness [R53.1] Fall, initial encounter [W19.XXXA] Patient Active Problem List   Diagnosis Date Noted   Transaminitis 08/13/2021   Hemoglobin decreased 03/17/2020   Lumbar L2 compression fracture, with routine healing, subsequent encounter 03/17/2020   Alzheimer's dementia ([place])    GI bleed 03/16/2020   Paroxysmal atrial fibrillation ([place]) 05/14/2019   CVA (cerebral vascular accident) ([place]) 05/10/2019   Essential hypertension 05/10/2019   Aortic stenosis, mild 04/13/2011   Benign hypertensive heart disease without heart failure 08/13/2010   Hyperlipidemia 08/13/2010   Bronchiectasis without acute exacerbation ([place]) 11/15/2007   PCP:  Lajean Manes, MD Pharmacy:   [facility] - [address] [place] [address] Phone: [phone] Fax: [place], [address] [place] [address] Phone: [phone] Fax: [phone]     Social Determinants of Health (SDOH) Interventions    Readmission Risk Interventions     View : No data  to display.

## 2021-08-13 NOTE — ED Notes (Signed)
ED TO INPATIENT HANDOFF REPORT  ED Nurse Name and Phone #:  Robet Leu [phone] Graceann Congress EMT-P  S Name/Age/Gender Melinda Edwards Leth 88 y.o. female Room/Bed: WA10/WA10  Code Status   Code Status: DNR  Home/SNF/Other Skilled nursing facility Patient oriented to: self, place Is this baseline? Yes   Triage Complete: Triage complete  Chief Complaint Transaminitis [R74.01]  Triage Note Pt arrived via EMS from [facility] caregiver reports a mechanical ground level fall when pt tried to get oob. Pt denies hitting her head or LOC. Pt denies any pain.   Allergies Allergies  Allergen Reactions   Antihistamines, Loratadine-Type Other (See Comments)    These cause nose bleeds   Loratadine Other (See Comments)    Caused a nose bleed   Namenda [Memantine] Other (See Comments)    Agitation    Level of Care/Admitting Diagnosis ED Disposition     ED Disposition  Admit   Condition  --   Comment  Hospital Area: [place] [[id number]]  Level of Care: Telemetry [5]  Admit to tele based on following criteria: Monitor for Ischemic changes  May place patient in observation at [facility] or [place] if equivalent level of care is available:: No  Covid Evaluation: Asymptomatic - no recent exposure (last 10 days) testing not required  Diagnosis: Transaminitis [010272]  Admitting Physician: Rhetta Mura [[id number]]  Attending Physician: Rhetta Mura [[id number]]          B Medical/Surgery History Past Medical History:  Diagnosis Date   Alzheimer's dementia ([place])    Atrial fibrillation ([place]) 05/14/2019   Hyperlipidemia    Hypertension    PAC (premature atrial contraction)    BENIGH   Palpitations    Pulmonary fibrosis ([place])    Pulmonary nodule    Stroke ([place]) 05/10/2019   Stroke ([place]) 06/16/2019   Past Surgical History:  Procedure Laterality Date   BREAST EXCISIONAL BIOPSY Right 2003   DILATION AND CURETTAGE OF UTERUS        A IV Location/Drains/Wounds Patient Lines/Drains/Airways Status     Active Line/Drains/Airways     Name Placement date Placement time Site Days   Peripheral IV 08/12/21 20 G 1" Right Antecubital 08/12/21  2259  Antecubital  1   External Urinary Catheter 08/12/21  2136  --  1            Intake/Output Last 24 hours No intake or output data in the 24 hours ending 08/13/21 0245  Labs/Imaging Results for orders placed or performed during the hospital encounter of 08/12/21 (from the past 48 hour(s))  Comprehensive metabolic panel     Status: Abnormal   Collection Time: 08/12/21 11:00 PM  Result Value Ref Range   Sodium 141 135 - 145 mmol/L   Potassium 3.5 3.5 - 5.1 mmol/L   Chloride 108 98 - 111 mmol/L   CO2 26 22 - 32 mmol/L   Glucose, Bld 150 (H) 70 - 99 mg/dL    Comment: Glucose reference range applies only to samples taken after fasting for at least 8 hours.   BUN 26 (H) 8 - 23 mg/dL   Creatinine, Ser 0.73 0.44 - 1.00 mg/dL   Calcium 9.2 8.9 - 10.3 mg/dL   Total Protein 7.4 6.5 - 8.1 g/dL   Albumin 3.7 3.5 - 5.0 g/dL   AST 1,027 (H) 15 - 41 U/L   ALT 1,256 (H) 0 - 44 U/L   Alkaline Phosphatase 190 (H) 38 - 126  U/L   Total Bilirubin 2.0 (H) 0.3 - 1.2 mg/dL   GFR, Estimated >60 >60 mL/min    Comment: (NOTE) Calculated using the CKD-EPI Creatinine Equation (2021)    Anion gap 7 5 - 15    Comment: Performed at [facility], [place] [address], [place], [place] [address]  CBC with Differential     Status: None   Collection Time: 08/12/21 11:00 PM  Result Value Ref Range   WBC 8.8 4.0 - 10.5 K/uL   RBC 4.83 3.87 - 5.11 MIL/uL   Hemoglobin 13.3 12.0 - 15.0 g/dL   HCT 41.6 36.0 - 46.0 %   MCV 86.1 80.0 - 100.0 fL   MCH 27.5 26.0 - 34.0 pg   MCHC 32.0 30.0 - 36.0 g/dL   RDW 13.8 11.5 - 15.5 %   Platelets 224 150 - 400 K/uL   nRBC 0.0 0.0 - 0.2 %   Neutrophils Relative % 77 %   Neutro Abs 6.8 1.7 - 7.7 K/uL   Lymphocytes Relative 12 %   Lymphs Abs  1.1 0.7 - 4.0 K/uL   Monocytes Relative 8 %   Monocytes Absolute 0.7 0.1 - 1.0 K/uL   Eosinophils Relative 1 %   Eosinophils Absolute 0.1 0.0 - 0.5 K/uL   Basophils Relative 1 %   Basophils Absolute 0.1 0.0 - 0.1 K/uL   Immature Granulocytes 1 %   Abs Immature Granulocytes 0.06 0.00 - 0.07 K/uL    Comment: Performed at [facility], [place] [address] [place], [place] [address]  Lipase, blood     Status: None   Collection Time: 08/12/21 11:00 PM  Result Value Ref Range   Lipase 37 11 - 51 U/L    Comment: Performed at [facility], [place] [address] [place], [place] [address]  Acetaminophen level     Status: Abnormal   Collection Time: 08/13/21 12:22 AM  Result Value Ref Range   Acetaminophen (Tylenol), Serum <10 (L) 10 - 30 ug/mL    Comment: (NOTE) Therapeutic concentrations vary significantly. A range of 10-30 ug/mL  may be an effective concentration for many patients. However, some  are best treated at concentrations outside of this range. Acetaminophen concentrations >150 ug/mL at 4 hours after ingestion  and >50 ug/mL at 12 hours after ingestion are often associated with  toxic reactions.  Performed at [facility], [place] [address] [place], [place] [address]   Protime-INR     Status: None   Collection Time: 08/13/21 12:22 AM  Result Value Ref Range   Prothrombin Time 15.0 11.4 - 15.2 seconds   INR 1.2 0.8 - 1.2    Comment: (NOTE) INR goal varies based on device and disease states. Performed at [facility], [place] [address], [place], [place] [address]   Ethanol     Status: None   Collection Time: 08/13/21 12:22 AM  Result Value Ref Range   Alcohol, Ethyl (B) <10 <10 mg/dL    Comment: (NOTE) Lowest detectable limit for serum alcohol is 10 mg/dL.  For medical purposes only. Performed at [facility], [place] [address] [place], [place] [address]    CT Head Wo Contrast  Result  Date: 08/13/2021 CLINICAL DATA:  Fall, dementia EXAM: CT HEAD WITHOUT CONTRAST CT CERVICAL SPINE WITHOUT CONTRAST TECHNIQUE: Multidetector CT imaging of the head and cervical spine was performed following the standard protocol without intravenous contrast. Multiplanar CT image reconstructions of the cervical spine were  also generated. RADIATION DOSE REDUCTION: This exam was performed according to the departmental dose-optimization program which includes automated exposure control, adjustment of the mA and/or kV according to patient size and/or use of iterative reconstruction technique. COMPARISON:  05/20/2021 FINDINGS: CT HEAD FINDINGS Brain: No evidence of acute infarction, hemorrhage, hydrocephalus, extra-axial collection or mass lesion/mass effect. Mild subcortical white matter and periventricular small vessel ischemic changes. Vascular: Intracranial atherosclerosis. Skull: Normal. Negative for fracture or focal lesion. Sinuses/Orbits: The visualized paranasal sinuses are essentially clear. The mastoid air cells are unopacified. Other: None. CT CERVICAL SPINE FINDINGS Alignment: Normal cervical lordosis. Skull base and vertebrae: No acute fracture. No primary bone lesion or focal pathologic process. Soft tissues and spinal canal: No prevertebral fluid or swelling. No visible canal hematoma. Disc levels: Mild degenerative changes of the mid cervical spine. Spinal canal is patent. Upper chest: Evaluated on dedicated CT chest. Other: None. IMPRESSION: No evidence of acute intracranial abnormality. Mild small vessel ischemic changes. No acute traumatic injury to the cervical spine. Mild degenerative changes. Electronically Signed   By: Julian Hy M.D.   On: 08/13/2021 00:21   CT Cervical Spine Wo Contrast  Result Date: 08/13/2021 CLINICAL DATA:  Fall, dementia EXAM: CT HEAD WITHOUT CONTRAST CT CERVICAL SPINE WITHOUT CONTRAST TECHNIQUE: Multidetector CT imaging of the head and cervical spine was performed  following the standard protocol without intravenous contrast. Multiplanar CT image reconstructions of the cervical spine were also generated. RADIATION DOSE REDUCTION: This exam was performed according to the departmental dose-optimization program which includes automated exposure control, adjustment of the mA and/or kV according to patient size and/or use of iterative reconstruction technique. COMPARISON:  05/20/2021 FINDINGS: CT HEAD FINDINGS Brain: No evidence of acute infarction, hemorrhage, hydrocephalus, extra-axial collection or mass lesion/mass effect. Mild subcortical white matter and periventricular small vessel ischemic changes. Vascular: Intracranial atherosclerosis. Skull: Normal. Negative for fracture or focal lesion. Sinuses/Orbits: The visualized paranasal sinuses are essentially clear. The mastoid air cells are unopacified. Other: None. CT CERVICAL SPINE FINDINGS Alignment: Normal cervical lordosis. Skull base and vertebrae: No acute fracture. No primary bone lesion or focal pathologic process. Soft tissues and spinal canal: No prevertebral fluid or swelling. No visible canal hematoma. Disc levels: Mild degenerative changes of the mid cervical spine. Spinal canal is patent. Upper chest: Evaluated on dedicated CT chest. Other: None. IMPRESSION: No evidence of acute intracranial abnormality. Mild small vessel ischemic changes. No acute traumatic injury to the cervical spine. Mild degenerative changes. Electronically Signed   By: Julian Hy M.D.   On: 08/13/2021 00:21   CT CHEST ABDOMEN PELVIS W CONTRAST  Result Date: 08/13/2021 CLINICAL DATA:  Fall, dementia EXAM: CT CHEST, ABDOMEN, AND PELVIS WITH CONTRAST TECHNIQUE: Multidetector CT imaging of the chest, abdomen and pelvis was performed following the standard protocol during bolus administration of intravenous contrast. RADIATION DOSE REDUCTION: This exam was performed according to the departmental dose-optimization program which includes  automated exposure control, adjustment of the mA and/or kV according to patient size and/or use of iterative reconstruction technique. CONTRAST:  12m OMNIPAQUE IOHEXOL 300 MG/ML  SOLN COMPARISON:  CT abdomen/pelvis dated 03/16/2020. CT chest dated 12/02/2017. FINDINGS: CT CHEST FINDINGS Cardiovascular: Heart is normal in size.  No pericardial effusion. No evidence of thoracic aortic aneurysm. Mild atherosclerotic calcifications the aortic root/arch. Mild three-coronary sclerosis. Mediastinum/Nodes: No suspicious mediastinal lymphadenopathy. Bilateral thyroid nodules, measuring up to 16 mm on the left. Given the patient's age and comorbidities, no follow-up is recommended. Lungs/Pleura: Biapical pleural-parenchymal scarring. Mild centrilobular and  paraseptal emphysematous changes, upper lung predominant. 9 mm nodule with adjacent 13 mm thin-walled cyst in the posteromedial right lower lobe (series 6/image 110), new from 2022. Surrounding post infectious/inflammatory scarring in the medial right lower lobe. Scattered areas of bronchiectasis, including in the bilateral upper lobes, medial right middle lobe, and lingula. No pleural effusion or pneumothorax. Musculoskeletal: Visualized osseous structures are within normal limits. No fracture is seen. CT ABDOMEN PELVIS FINDINGS Hepatobiliary: Left hepatic cysts, measuring up to 19 mm (series 2/image 82), benign. Gallbladder is unremarkable. No intrahepatic or extrahepatic ductal dilatation. Pancreas: Within normal limits. Spleen: Within normal limits. Adrenals/Urinary Tract: Stable 2.4 cm left adrenal nodule (series 2/image 6), compatible with a benign adrenal adenoma. No follow-up is recommended. Right adrenal glands are within normal limits. Subcentimeter interpolar left renal cyst (series 2/image 63). Right kidney is within normal limits. No hydronephrosis. Bladder is mildly thick-walled although underdistended. Stomach/Bowel: Stomach is notable for a tiny hiatal  hernia. No evidence of bowel obstruction. Normal appendix (series 2/image 101). Extensive colonic diverticulosis, without evidence of diverticulitis. Vascular/Lymphatic: No evidence of abdominal aortic aneurysm. Atherosclerotic calcifications of the abdominal aorta and branch vessels. Retroaortic left renal vein. No suspicious abdominopelvic lymphadenopathy. Reproductive: Uterus is within normal limits. Bilateral ovaries are within normal limits. Other: No abdominopelvic ascites. Musculoskeletal: Mild superior endplate compression fracture deformity at L2, unchanged. No fracture is seen. IMPRESSION: No evidence of traumatic injury to the chest, abdomen, or pelvis. 9 mm right lower lobe pulmonary nodule, new from 2022. While this may be infectious/inflammatory, a small primary bronchogenic neoplasm is not excluded. Given the patient's age and comorbidities, it is unclear that aggressive follow-up is required. Consider CT chest in 6-12 months if clinically warranted. Additional ancillary findings as above. Electronically Signed   By: Julian Hy M.D.   On: 08/13/2021 00:30   US Abdomen Limited RUQ (LIVER/GB)  Result Date: 08/13/2021 CLINICAL DATA:  Elevated LFTs EXAM: ULTRASOUND ABDOMEN LIMITED RIGHT UPPER QUADRANT COMPARISON:  CT chest abdomen and pelvis 08/13/2021 FINDINGS: Gallbladder: There is a 1.2 x 0.8 by 0.6 cm nonshadowing echogenic area adjacent to the gallbladder wall, non mobile. This has a ball on the wall appearance. This is favored as a polyp. There is no gallbladder wall thickening or pericholecystic fluid. No cholelithiasis. Sonographic Percell Miller sign is negative. Common bile duct: Diameter: 6 mm Liver: Two hepatic cysts are identified in the left lobe of the liver. One measures 9 x 7 x 9 mm in the other measures 2.3 x 1.6 x 2.0 cm. Within normal limits in parenchymal echogenicity. Portal vein is patent on color Doppler imaging with normal direction of blood flow towards the liver. Other: None.  IMPRESSION: 1. 1.2 cm gallbladder polyp. Follow-up ultrasound recommended in 6 months. 2. Hepatic cysts. Electronically Signed   By: Ronney Asters M.D.   On: 08/13/2021 01:38    Pending Labs Unresulted Labs (From admission, onward)     Start     Ordered   08/13/21 0500  Magnesium  Tomorrow morning,   R        08/13/21 0241   08/13/21 0500  Comprehensive metabolic panel  Tomorrow morning,   R        08/13/21 0241   08/13/21 0500  CBC with Differential/Platelet  Tomorrow morning,   R        08/13/21 0241   08/13/21 0500  Protime-INR  Tomorrow morning,   R        08/13/21 0241   08/13/21 0500  Bilirubin,  direct  Tomorrow morning,   R        08/13/21 0241   08/13/21 0500  Gamma GT  Tomorrow morning,   R        08/13/21 0241   08/13/21 0241  Magnesium  Add-on,   AD        08/13/21 0241   08/12/21 2252  Urine Culture  Once,   URGENT       Question:  Indication  Answer:  Dysuria   08/12/21 2252   08/12/21 2242  Urinalysis, Routine w reflex microscopic  Once,   URGENT        08/12/21 2244            Vitals/Pain Today's Vitals   08/13/21 0032 08/13/21 0044 08/13/21 0100 08/13/21 0152  BP:  (!) 142/98 140/74   Pulse:  83 89   Resp:  16 19   Temp:      TempSrc:      SpO2:  94% 91%   PainSc: 0-No pain   0-No pain    Isolation Precautions No active isolations  Medications Medications  acetaminophen (TYLENOL) tablet 650 mg (has no administration in time range)    Or  acetaminophen (TYLENOL) suppository 650 mg (has no administration in time range)  naloxone (NARCAN) injection 0.4 mg (has no administration in time range)  fentaNYL (SUBLIMAZE) injection 25 mcg (has no administration in time range)  ondansetron (ZOFRAN) injection 4 mg (has no administration in time range)  iohexol (OMNIPAQUE) 300 MG/ML solution 100 mL (100 mLs Intravenous Contrast Given 08/13/21 0005)    Mobility non-ambulatory High fall risk   Focused Assessments     R Recommendations: See Admitting  Provider Note  Report given to:   Additional Notes:

## 2021-08-13 NOTE — ED Notes (Signed)
Pt transported to CT ?

## 2021-08-13 NOTE — Consult Note (Signed)
Referring Provider: [facility] Primary Care Physician:  Lajean Manes, MD Primary Gastroenterologist:  unassigned (former patient of Dr. Wynetta Emery)  Reason for Consultation:  Transaminitis  HPI: Melinda Edwards is a 88 y.o. female  history of paroxysmal atrial fibrillation not on chronic anticoagulation, dementia presents for evaluation of transaminitis  Patient presents from assisted living facility.  Caregiver states that on Tuesday 5/31 after eating tuna casserole patient developed a 15-minute episode of vomiting nonbloody, nonbilious emesis.  She states the next day patient appeared weak but had no further episodes of vomiting.  Patient then tried to get out of bed and had a mechanical fall, most likely due to weakness.  Unwitnessed, no loss of consciousness.  Caregiver states other residents who consumed the same tuna casserole experienced diarrhea and similar symptoms.  Patient did not experience diarrhea and has had normal bowel movements. denies melena/hematochezia. Denies blood thinner use. On ASA daily since stroke Feb 2022.  CT abdomen pelvis and right upper quadrant ultrasound showed nonobstructive gallbladder polyp.  Labs notable for acute transaminitis with AST 722/ALT 1079/alk phos 180/T. bili 1.6.  Last colonoscopy 05/2008 with Dr. Wynetta Emery, 3 mm hyperplastic polyp in rectum.  Diverticulosis in the ascending and sigmoid colon.  Past Medical History:  Diagnosis Date   Alzheimer's dementia [facility])    Atrial fibrillation ([place]) 05/14/2019   Hyperlipidemia    Hypertension    PAC (premature atrial contraction)    BENIGH   Palpitations    Pulmonary fibrosis ([place])    Pulmonary nodule    Stroke ([place]) 05/10/2019   Stroke ([place]) 06/16/2019    Past Surgical History:  Procedure Laterality Date   BREAST EXCISIONAL BIOPSY Right 2003   DILATION AND CURETTAGE OF UTERUS      Prior to Admission medications   Medication Sig Start Date End Date Taking? Authorizing Provider  aspirin EC 81 MG tablet  Take 81 mg by mouth daily. Swallow whole.   Yes [provider]  b complex vitamins capsule Take 1 capsule by mouth daily.   Yes [provider]  cholecalciferol (VITAMIN D) 25 MCG (1000 UNIT) tablet Take 1,000 Units by mouth daily.   Yes [provider]  diltiazem (CARDIZEM CD) 120 MG 24 hr capsule Take 1 capsule (120 mg total) by mouth daily. 05/14/19 08/12/21 Yes Charlynne Cousins, MD  mirtazapine (REMERON) 15 MG tablet Take 15 mg by mouth at bedtime.   Yes [provider]  Probiotic Product (PROBIOTIC-10 PO) Take by mouth.   Yes [provider]  atorvastatin (LIPITOR) 10 MG tablet Take 1 tablet (10 mg total) by mouth daily. Patient not taking: Reported on 08/12/2021 10/19/13   Darlin Coco, MD  ondansetron (ZOFRAN) 4 MG tablet Take 1 tablet (4 mg total) by mouth every 8 (eight) hours as needed for nausea or vomiting. Patient not taking: Reported on 08/12/2021 05/20/21   Horton, Drue Dun M, DO  polyethylene glycol (MIRALAX / GLYCOLAX) 17 g packet Take 17 g by mouth daily as needed for mild constipation. Patient not taking: Reported on 08/12/2021 03/21/20   Alcus Dad, MD  potassium chloride SA (K-DUR,KLOR-CON) 20 MEQ tablet Take 1 tablet (20 mEq total) by mouth daily. Patient not taking: Reported on 08/12/2021 10/19/13   Darlin Coco, MD    Scheduled Meds: Continuous Infusions: PRN Meds:.acetaminophen **OR** acetaminophen, fentaNYL (SUBLIMAZE) injection, naLOXone (NARCAN)  injection, ondansetron (ZOFRAN) IV  Allergies as of 08/12/2021 - Review Complete 08/12/2021  Allergen Reaction Noted   Antihistamines, loratadine-type Other (See Comments) 02/18/2020  Loratadine Other (See Comments) 08/27/2019   Namenda [memantine] Other (See Comments) 08/27/2019    Family History  Problem Relation Age of Onset   Heart disease Other    Heart attack Brother        x 3   Heart failure Mother        chf    Social History   Socioeconomic History    Marital status: Single    Spouse name: Not on file   Number of children: 0   Years of education: Not on file   Highest education level: Not on file  Occupational History   Occupation: Retired  Tobacco Use   Smoking status: Former    Packs/day: 1.00    Types: Cigarettes    Quit date: 08/12/1995    Years since quitting: 26.0   Smokeless tobacco: Never  Substance and Sexual Activity   Alcohol use: No   Drug use: No   Sexual activity: Not on file  Other Topics Concern   Not on file  Social History Narrative   ** Merged History Encounter **       06/14/19 living at [place] Strain: Not on file  Food Insecurity: Not on file  Transportation Needs: Not on file  Physical Activity: Not on file  Stress: Not on file  Social Connections: Not on file  Intimate Partner Violence: Not on file    Review of Systems: Review of Systems  Unable to perform ROS: Dementia    Physical Exam:Physical Exam Constitutional:      General: She is not in acute distress.    Appearance: She is normal weight. She is not ill-appearing.  HENT:     Head: Normocephalic and atraumatic.     Nose: Nose normal. No congestion.     Mouth/Throat:     Mouth: Mucous membranes are moist.     Pharynx: Oropharynx is clear.  Eyes:     Extraocular Movements: Extraocular movements intact.     Conjunctiva/sclera: Conjunctivae normal.  Cardiovascular:     Rate and Rhythm: Normal rate and regular rhythm.  Pulmonary:     Effort: Pulmonary effort is normal. No respiratory distress.  Abdominal:     General: Abdomen is flat. Bowel sounds are normal. There is no distension.     Palpations: Abdomen is soft. There is no mass.     Tenderness: There is no abdominal tenderness. There is no guarding or rebound.     Hernia: No hernia is present.  Musculoskeletal:        General: No swelling. Normal range of motion.     Cervical back: Normal range of motion and neck  supple.  Skin:    General: Skin is warm and dry.  Neurological:     General: No focal deficit present.     Mental Status: She is alert and oriented to person, place, and time.  Psychiatric:        Mood and Affect: Mood normal.        Behavior: Behavior normal.        Thought Content: Thought content normal.        Judgment: Judgment normal.    Vital signs: Vitals:   08/13/21 0257 08/13/21 0404  BP: 132/64 140/89  Pulse: 80 82  Resp: 18 16  Temp: 98.8 F (37.1 C) 97.8 F (36.6 C)  SpO2: 92% 97%        GI:  Lab Results: Recent  Labs    08/12/21 2300 08/13/21 0510  WBC 8.8 9.7  HGB 13.3 13.4  HCT 41.6 42.2  PLT 224 221   BMET Recent Labs    08/12/21 2300 08/13/21 0510  NA 141 143  K 3.5 3.7  CL 108 109  CO2 26 25  GLUCOSE 150* 123*  BUN 26* 22  CREATININE 0.73 0.66  CALCIUM 9.2 9.1   LFT Recent Labs    08/13/21 0510  PROT 7.1  ALBUMIN 3.5  AST 722*  ALT 1,079*  ALKPHOS 180*  BILITOT 1.6*  BILIDIR 0.6*   PT/INR Recent Labs    08/13/21 0022 08/13/21 0510  LABPROT 15.0 14.1  INR 1.2 1.1     Studies/Results: CT Head Wo Contrast  Result Date: 08/13/2021 CLINICAL DATA:  Fall, dementia EXAM: CT HEAD WITHOUT CONTRAST CT CERVICAL SPINE WITHOUT CONTRAST TECHNIQUE: Multidetector CT imaging of the head and cervical spine was performed following the standard protocol without intravenous contrast. Multiplanar CT image reconstructions of the cervical spine were also generated. RADIATION DOSE REDUCTION: This exam was performed according to the departmental dose-optimization program which includes automated exposure control, adjustment of the mA and/or kV according to patient size and/or use of iterative reconstruction technique. COMPARISON:  05/20/2021 FINDINGS: CT HEAD FINDINGS Brain: No evidence of acute infarction, hemorrhage, hydrocephalus, extra-axial collection or mass lesion/mass effect. Mild subcortical white matter and periventricular small vessel  ischemic changes. Vascular: Intracranial atherosclerosis. Skull: Normal. Negative for fracture or focal lesion. Sinuses/Orbits: The visualized paranasal sinuses are essentially clear. The mastoid air cells are unopacified. Other: None. CT CERVICAL SPINE FINDINGS Alignment: Normal cervical lordosis. Skull base and vertebrae: No acute fracture. No primary bone lesion or focal pathologic process. Soft tissues and spinal canal: No prevertebral fluid or swelling. No visible canal hematoma. Disc levels: Mild degenerative changes of the mid cervical spine. Spinal canal is patent. Upper chest: Evaluated on dedicated CT chest. Other: None. IMPRESSION: No evidence of acute intracranial abnormality. Mild small vessel ischemic changes. No acute traumatic injury to the cervical spine. Mild degenerative changes. Electronically Signed   By: Julian Hy M.D.   On: 08/13/2021 00:21   CT Cervical Spine Wo Contrast  Result Date: 08/13/2021 CLINICAL DATA:  Fall, dementia EXAM: CT HEAD WITHOUT CONTRAST CT CERVICAL SPINE WITHOUT CONTRAST TECHNIQUE: Multidetector CT imaging of the head and cervical spine was performed following the standard protocol without intravenous contrast. Multiplanar CT image reconstructions of the cervical spine were also generated. RADIATION DOSE REDUCTION: This exam was performed according to the departmental dose-optimization program which includes automated exposure control, adjustment of the mA and/or kV according to patient size and/or use of iterative reconstruction technique. COMPARISON:  05/20/2021 FINDINGS: CT HEAD FINDINGS Brain: No evidence of acute infarction, hemorrhage, hydrocephalus, extra-axial collection or mass lesion/mass effect. Mild subcortical white matter and periventricular small vessel ischemic changes. Vascular: Intracranial atherosclerosis. Skull: Normal. Negative for fracture or focal lesion. Sinuses/Orbits: The visualized paranasal sinuses are essentially clear. The mastoid  air cells are unopacified. Other: None. CT CERVICAL SPINE FINDINGS Alignment: Normal cervical lordosis. Skull base and vertebrae: No acute fracture. No primary bone lesion or focal pathologic process. Soft tissues and spinal canal: No prevertebral fluid or swelling. No visible canal hematoma. Disc levels: Mild degenerative changes of the mid cervical spine. Spinal canal is patent. Upper chest: Evaluated on dedicated CT chest. Other: None. IMPRESSION: No evidence of acute intracranial abnormality. Mild small vessel ischemic changes. No acute traumatic injury to the cervical spine. Mild degenerative changes. Electronically  Signed   By: Julian Hy M.D.   On: 08/13/2021 00:21   CT CHEST ABDOMEN PELVIS W CONTRAST  Result Date: 08/13/2021 CLINICAL DATA:  Fall, dementia EXAM: CT CHEST, ABDOMEN, AND PELVIS WITH CONTRAST TECHNIQUE: Multidetector CT imaging of the chest, abdomen and pelvis was performed following the standard protocol during bolus administration of intravenous contrast. RADIATION DOSE REDUCTION: This exam was performed according to the departmental dose-optimization program which includes automated exposure control, adjustment of the mA and/or kV according to patient size and/or use of iterative reconstruction technique. CONTRAST:  165mL OMNIPAQUE IOHEXOL 300 MG/ML  SOLN COMPARISON:  CT abdomen/pelvis dated 03/16/2020. CT chest dated 12/02/2017. FINDINGS: CT CHEST FINDINGS Cardiovascular: Heart is normal in size.  No pericardial effusion. No evidence of thoracic aortic aneurysm. Mild atherosclerotic calcifications the aortic root/arch. Mild three-coronary sclerosis. Mediastinum/Nodes: No suspicious mediastinal lymphadenopathy. Bilateral thyroid nodules, measuring up to 16 mm on the left. Given the patient's age and comorbidities, no follow-up is recommended. Lungs/Pleura: Biapical pleural-parenchymal scarring. Mild centrilobular and paraseptal emphysematous changes, upper lung predominant. 9 mm  nodule with adjacent 13 mm thin-walled cyst in the posteromedial right lower lobe (series 6/image 110), new from 2022. Surrounding post infectious/inflammatory scarring in the medial right lower lobe. Scattered areas of bronchiectasis, including in the bilateral upper lobes, medial right middle lobe, and lingula. No pleural effusion or pneumothorax. Musculoskeletal: Visualized osseous structures are within normal limits. No fracture is seen. CT ABDOMEN PELVIS FINDINGS Hepatobiliary: Left hepatic cysts, measuring up to 19 mm (series 2/image 82), benign. Gallbladder is unremarkable. No intrahepatic or extrahepatic ductal dilatation. Pancreas: Within normal limits. Spleen: Within normal limits. Adrenals/Urinary Tract: Stable 2.4 cm left adrenal nodule (series 2/image 6), compatible with a benign adrenal adenoma. No follow-up is recommended. Right adrenal glands are within normal limits. Subcentimeter interpolar left renal cyst (series 2/image 63). Right kidney is within normal limits. No hydronephrosis. Bladder is mildly thick-walled although underdistended. Stomach/Bowel: Stomach is notable for a tiny hiatal hernia. No evidence of bowel obstruction. Normal appendix (series 2/image 101). Extensive colonic diverticulosis, without evidence of diverticulitis. Vascular/Lymphatic: No evidence of abdominal aortic aneurysm. Atherosclerotic calcifications of the abdominal aorta and branch vessels. Retroaortic left renal vein. No suspicious abdominopelvic lymphadenopathy. Reproductive: Uterus is within normal limits. Bilateral ovaries are within normal limits. Other: No abdominopelvic ascites. Musculoskeletal: Mild superior endplate compression fracture deformity at L2, unchanged. No fracture is seen. IMPRESSION: No evidence of traumatic injury to the chest, abdomen, or pelvis. 9 mm right lower lobe pulmonary nodule, new from 2022. While this may be infectious/inflammatory, a small primary bronchogenic neoplasm is not excluded.  Given the patient's age and comorbidities, it is unclear that aggressive follow-up is required. Consider CT chest in 6-12 months if clinically warranted. Additional ancillary findings as above. Electronically Signed   By: Julian Hy M.D.   On: 08/13/2021 00:30   US Abdomen Limited RUQ (LIVER/GB)  Result Date: 08/13/2021 CLINICAL DATA:  Elevated LFTs EXAM: ULTRASOUND ABDOMEN LIMITED RIGHT UPPER QUADRANT COMPARISON:  CT chest abdomen and pelvis 08/13/2021 FINDINGS: Gallbladder: There is a 1.2 x 0.8 by 0.6 cm nonshadowing echogenic area adjacent to the gallbladder wall, non mobile. This has a ball on the wall appearance. This is favored as a polyp. There is no gallbladder wall thickening or pericholecystic fluid. No cholelithiasis. Sonographic Percell Miller sign is negative. Common bile duct: Diameter: 6 mm Liver: Two hepatic cysts are identified in the left lobe of the liver. One measures 9 x 7 x 9 mm in the other  measures 2.3 x 1.6 x 2.0 cm. Within normal limits in parenchymal echogenicity. Portal vein is patent on color Doppler imaging with normal direction of blood flow towards the liver. Other: None. IMPRESSION: 1. 1.2 cm gallbladder polyp. Follow-up ultrasound recommended in 6 months. 2. Hepatic cysts. Electronically Signed   By: Ronney Asters M.D.   On: 08/13/2021 01:38    Impression: Transaminitis -AST 722/ALT 1079/alk phos 180, trending down - T. Bili 1.6 (2.0) -BUN 22, creatinine 0.66 -No leukocytosis -No anemia -Hepatitis panel pending - Ethanol and acetaminophen level negative -Magnesium 2.4 -GGT 135 -CT chest abdomen pelvis with contrast: Left hepatic cysts, unremarkable gallbladder, diverticulosis. -RUQ ultrasound: 1.2 cm gallbladder polyp, hepatic cysts  Plan: Elevated LFTs are trending down. Will await results of hepatitis panel.  Symptoms and elevated LFTs likely viral in nature, especially with other residents experiencing similar symptoms. Negative CT. Continue supportive  care [facility] will follow   LOS: 0 days   Nuchem Grattan Radford Pax  PA-C 08/13/2021, 9:25 AM  Contact #  [phone]

## 2021-08-14 DIAGNOSIS — W1830XA Fall on same level, unspecified, initial encounter: Secondary | ICD-10-CM | POA: Diagnosis not present

## 2021-08-14 DIAGNOSIS — R7989 Other specified abnormal findings of blood chemistry: Secondary | ICD-10-CM | POA: Diagnosis not present

## 2021-08-14 DIAGNOSIS — J9601 Acute respiratory failure with hypoxia: Secondary | ICD-10-CM

## 2021-08-14 DIAGNOSIS — K559 Vascular disorder of intestine, unspecified: Secondary | ICD-10-CM | POA: Diagnosis not present

## 2021-08-14 DIAGNOSIS — E869 Volume depletion, unspecified: Secondary | ICD-10-CM | POA: Diagnosis not present

## 2021-08-14 DIAGNOSIS — N390 Urinary tract infection, site not specified: Secondary | ICD-10-CM | POA: Diagnosis present

## 2021-08-14 DIAGNOSIS — G309 Alzheimer's disease, unspecified: Secondary | ICD-10-CM | POA: Diagnosis not present

## 2021-08-14 DIAGNOSIS — Z7982 Long term (current) use of aspirin: Secondary | ICD-10-CM | POA: Diagnosis not present

## 2021-08-14 DIAGNOSIS — F0280 Dementia in other diseases classified elsewhere without behavioral disturbance: Secondary | ICD-10-CM

## 2021-08-14 DIAGNOSIS — Z8249 Family history of ischemic heart disease and other diseases of the circulatory system: Secondary | ICD-10-CM | POA: Diagnosis not present

## 2021-08-14 DIAGNOSIS — Z66 Do not resuscitate: Secondary | ICD-10-CM | POA: Diagnosis not present

## 2021-08-14 DIAGNOSIS — E785 Hyperlipidemia, unspecified: Secondary | ICD-10-CM

## 2021-08-14 DIAGNOSIS — I10 Essential (primary) hypertension: Secondary | ICD-10-CM | POA: Diagnosis not present

## 2021-08-14 DIAGNOSIS — B964 Proteus (mirabilis) (morganii) as the cause of diseases classified elsewhere: Secondary | ICD-10-CM | POA: Diagnosis not present

## 2021-08-14 DIAGNOSIS — J479 Bronchiectasis, uncomplicated: Secondary | ICD-10-CM | POA: Diagnosis not present

## 2021-08-14 DIAGNOSIS — Z87891 Personal history of nicotine dependence: Secondary | ICD-10-CM | POA: Diagnosis not present

## 2021-08-14 DIAGNOSIS — J8410 Pulmonary fibrosis, unspecified: Secondary | ICD-10-CM | POA: Diagnosis not present

## 2021-08-14 DIAGNOSIS — R7401 Elevation of levels of liver transaminase levels: Secondary | ICD-10-CM | POA: Diagnosis not present

## 2021-08-14 DIAGNOSIS — K5900 Constipation, unspecified: Secondary | ICD-10-CM | POA: Diagnosis not present

## 2021-08-14 DIAGNOSIS — K7200 Acute and subacute hepatic failure without coma: Secondary | ICD-10-CM | POA: Diagnosis not present

## 2021-08-14 DIAGNOSIS — I480 Paroxysmal atrial fibrillation: Secondary | ICD-10-CM | POA: Diagnosis not present

## 2021-08-14 DIAGNOSIS — Z79899 Other long term (current) drug therapy: Secondary | ICD-10-CM | POA: Diagnosis not present

## 2021-08-14 DIAGNOSIS — R945 Abnormal results of liver function studies: Secondary | ICD-10-CM | POA: Diagnosis not present

## 2021-08-14 DIAGNOSIS — K824 Cholesterolosis of gallbladder: Secondary | ICD-10-CM | POA: Diagnosis not present

## 2021-08-14 DIAGNOSIS — Z888 Allergy status to other drugs, medicaments and biological substances status: Secondary | ICD-10-CM | POA: Diagnosis not present

## 2021-08-14 DIAGNOSIS — Z8673 Personal history of transient ischemic attack (TIA), and cerebral infarction without residual deficits: Secondary | ICD-10-CM | POA: Diagnosis not present

## 2021-08-14 HISTORY — DX: Acute respiratory failure with hypoxia: J96.01

## 2021-08-14 HISTORY — DX: Urinary tract infection, site not specified: N39.0

## 2021-08-14 LAB — HEPATIC FUNCTION PANEL
ALT: 591 U/L — ABNORMAL HIGH (ref 0–44)
AST: 203 U/L — ABNORMAL HIGH (ref 15–41)
Albumin: 3.1 g/dL — ABNORMAL LOW (ref 3.5–5.0)
Alkaline Phosphatase: 149 U/L — ABNORMAL HIGH (ref 38–126)
Bilirubin, Direct: 0.3 mg/dL — ABNORMAL HIGH (ref 0.0–0.2)
Indirect Bilirubin: 0.7 mg/dL (ref 0.3–0.9)
Total Bilirubin: 1 mg/dL (ref 0.3–1.2)
Total Protein: 6.5 g/dL (ref 6.5–8.1)

## 2021-08-14 LAB — CBC
HCT: 41.7 % (ref 36.0–46.0)
Hemoglobin: 13.2 g/dL (ref 12.0–15.0)
MCH: 27.6 pg (ref 26.0–34.0)
MCHC: 31.7 g/dL (ref 30.0–36.0)
MCV: 87.2 fL (ref 80.0–100.0)
Platelets: 192 10*3/uL (ref 150–400)
RBC: 4.78 MIL/uL (ref 3.87–5.11)
RDW: 13.8 % (ref 11.5–15.5)
WBC: 9.3 10*3/uL (ref 4.0–10.5)
nRBC: 0 % (ref 0.0–0.2)

## 2021-08-14 LAB — BASIC METABOLIC PANEL
Anion gap: 7 (ref 5–15)
BUN: 17 mg/dL (ref 8–23)
CO2: 26 mmol/L (ref 22–32)
Calcium: 8.6 mg/dL — ABNORMAL LOW (ref 8.9–10.3)
Chloride: 108 mmol/L (ref 98–111)
Creatinine, Ser: 0.56 mg/dL (ref 0.44–1.00)
GFR, Estimated: >60 mL/min (ref >60)
Glucose, Bld: 113 mg/dL — ABNORMAL HIGH (ref 70–99)
Potassium: 3.6 mmol/L (ref 3.5–5.1)
Sodium: 141 mmol/L (ref 135–145)

## 2021-08-14 LAB — HEPATITIS PANEL, ACUTE
HCV Ab: NONREACTIVE
Hep A IgM: UNDETERMINED — AB
Hep B C IgM: NONREACTIVE
Hepatitis B Surface Ag: NONREACTIVE

## 2021-08-14 NOTE — Progress Notes (Signed)
PROGRESS NOTE    Melinda Edwards  UMP:[id number] DOB: 11-30-1926 DOA: 08/12/2021 PCP: Lajean Manes, MD   Brief Narrative:  88 y.o. female with medical history significant of Alzheimer's dementia, paroxysmal atrial fibrillation, hypertension, pulmonary fibrosis, bronchiectasis, pulmonary nodule, history of other nonhemorrhagic stroke, mild aortic stenosis was brought to the emergency department because of a fall at her residential facility.  Work-up revealed a transaminitis with hyperbilirubinemia along with possible UTI.  CT of the head was negative for acute intracranial abnormality.  CT cervical spine was negative for traumatic injury.  CT chest now shows new 9 mm right lower lobe pulmonary nodule with follow-up recommended in 6 to 12 months if clinically warranted.  Ultrasound of the gallbladder showed a 1.2 cm polyp with recommendations to follow-up in 6 months.  She also has hepatic cysts.  She was started on IV Rocephin.  GI was consulted.  Assessment & Plan:   Possible UTI: Present on admission -Follow urine cultures.  Continue Rocephin  Acute transaminitis -Possibly from ischemic enteritis/shock liver.  Hepatitis panel negative.  LFTs improving.  Monitor.  GI following.  Fall at residential facility -Imaging negative for acute intracranial abnormity or cervical spine fracture. -Fall precautions.  PT eval  Volume depletion -Treated with IV fluids.  Decrease IV fluids to 50 cc an hour.  Bronchiectasis  Acute respiratory failure with hypoxia -Continue  bronchodilators as needed.  Currently on 3 L oxygen via nasal cannula.  Wean off as able.  Hyperlipidemia -No longer on atorvastatin.  Contraindicated at this time because of acute liver injury  Essential hypertension -Blood pressure currently stable.  Continue diltiazem  Paroxysmal A-fib -Currently rate controlled.  Continue aspirin.  Not on anticoagulation as an outpatient.  Continue Cardizem  Alzheimer's  dementia -Continue supportive care.  Continue Remeron.    DVT prophylaxis: Start Lovenox Code Status: DNR Family Communication: None at bedside Disposition Plan: Status is: Observation The patient will require care spanning > 2 midnights and should be moved to inpatient because: Of need for IV antibiotics.  PT evaluation pending.  Consultants: GI  Procedures: None  Antimicrobials: Rocephin from 08/13/2021 onwards   Subjective: Patient seen and examined at bedside.  Confused.  No overnight fever, vomiting, seizures reported.  Objective: Vitals:   08/13/21 1413 08/13/21 2030 08/14/21 0431 08/14/21 0437  BP: (!) 141/65 134/78 117/75   Pulse: 85 80 90   Resp: (!) '24 15 16   '$ Temp: 99.3 F (37.4 C) 98.2 F (36.8 C) 98.4 F (36.9 C)   TempSrc: Oral Oral Oral   SpO2: 97% 97% 98%   Weight:    65.2 kg  Height:        Intake/Output Summary (Last 24 hours) at 08/14/2021 1049 Last data filed at 08/14/2021 0600 Gross per 24 hour  Intake 1354.95 ml  Output 200 ml  Net 1154.95 ml   Filed Weights   08/13/21 0404 08/14/21 0437  Weight: 61.5 kg 65.2 kg    Examination:  General exam: Appears calm and comfortable.  Chronically ill looking and deconditioned elderly woman lying in bed.  Currently on 3 L oxygen via nasal cannula. Respiratory system: Bilateral decreased breath sounds at bases with scattered crackles and intermittent tachypnea Cardiovascular system: S1 & S2 heard, Rate controlled Gastrointestinal system: Abdomen is nondistended, soft and nontender. Normal bowel sounds heard. Extremities: No cyanosis, clubbing; trace lower extremity edema Central nervous system: Awake, confused, poor historian no focal neurological deficits. Moving extremities Skin: No rashes, lesions or ulcers Psychiatry: Could not  be assessed because of mental status.  No signs of agitation currently.    Data Reviewed: I have personally reviewed following labs and imaging studies  CBC: Recent Labs   Lab 08/12/21 2300 08/13/21 0510 08/14/21 0451  WBC 8.8 9.7 9.3  NEUTROABS 6.8 7.7  --   HGB 13.3 13.4 13.2  HCT 41.6 42.2 41.7  MCV 86.1 86.5 87.2  PLT 224 221 938   Basic Metabolic Panel: Recent Labs  Lab 08/12/21 2300 08/13/21 0230 08/13/21 0510 08/14/21 0451  NA 141  --  143 141  K 3.5  --  3.7 3.6  CL 108  --  109 108  CO2 26  --  25 26  GLUCOSE 150*  --  123* 113*  BUN 26*  --  22 17  CREATININE 0.73  --  0.66 0.56  CALCIUM 9.2  --  9.1 8.6*  MG  --  1.8 2.4  --    GFR: Estimated Creatinine Clearance: 39.6 mL/min (by C-G formula based on SCr of 0.56 mg/dL). Liver Function Tests: Recent Labs  Lab 08/12/21 2300 08/13/21 0510 08/14/21 0451  AST 1,027* 722* 203*  ALT 1,256* 1,079* 591*  ALKPHOS 190* 180* 149*  BILITOT 2.0* 1.6* 1.0  PROT 7.4 7.1 6.5  ALBUMIN 3.7 3.5 3.1*   Recent Labs  Lab 08/12/21 2300  LIPASE 37   No results for input(s): AMMONIA in the last 168 hours. Coagulation Profile: Recent Labs  Lab 08/13/21 0022 08/13/21 0510  INR 1.2 1.1   Cardiac Enzymes: No results for input(s): CKTOTAL, CKMB, CKMBINDEX, TROPONINI in the last 168 hours. BNP (last 3 results) No results for input(s): PROBNP in the last 8760 hours. HbA1C: No results for input(s): HGBA1C in the last 72 hours. CBG: No results for input(s): GLUCAP in the last 168 hours. Lipid Profile: No results for input(s): CHOL, HDL, LDLCALC, TRIG, CHOLHDL, LDLDIRECT in the last 72 hours. Thyroid Function Tests: No results for input(s): TSH, T4TOTAL, FREET4, T3FREE, THYROIDAB in the last 72 hours. Anemia Panel: No results for input(s): VITAMINB12, FOLATE, FERRITIN, TIBC, IRON, RETICCTPCT in the last 72 hours. Sepsis Labs: No results for input(s): PROCALCITON, LATICACIDVEN in the last 168 hours.  No results found for this or any previous visit (from the past 240 hour(s)).       Radiology Studies: CT Head Wo Contrast  Result Date: 08/13/2021 CLINICAL DATA:  Fall, dementia  EXAM: CT HEAD WITHOUT CONTRAST CT CERVICAL SPINE WITHOUT CONTRAST TECHNIQUE: Multidetector CT imaging of the head and cervical spine was performed following the standard protocol without intravenous contrast. Multiplanar CT image reconstructions of the cervical spine were also generated. RADIATION DOSE REDUCTION: This exam was performed according to the departmental dose-optimization program which includes automated exposure control, adjustment of the mA and/or kV according to patient size and/or use of iterative reconstruction technique. COMPARISON:  05/20/2021 FINDINGS: CT HEAD FINDINGS Brain: No evidence of acute infarction, hemorrhage, hydrocephalus, extra-axial collection or mass lesion/mass effect. Mild subcortical white matter and periventricular small vessel ischemic changes. Vascular: Intracranial atherosclerosis. Skull: Normal. Negative for fracture or focal lesion. Sinuses/Orbits: The visualized paranasal sinuses are essentially clear. The mastoid air cells are unopacified. Other: None. CT CERVICAL SPINE FINDINGS Alignment: Normal cervical lordosis. Skull base and vertebrae: No acute fracture. No primary bone lesion or focal pathologic process. Soft tissues and spinal canal: No prevertebral fluid or swelling. No visible canal hematoma. Disc levels: Mild degenerative changes of the mid cervical spine. Spinal canal is patent. Upper chest: Evaluated on  dedicated CT chest. Other: None. IMPRESSION: No evidence of acute intracranial abnormality. Mild small vessel ischemic changes. No acute traumatic injury to the cervical spine. Mild degenerative changes. Electronically Signed   By: Julian Hy M.D.   On: 08/13/2021 00:21   CT Cervical Spine Wo Contrast  Result Date: 08/13/2021 CLINICAL DATA:  Fall, dementia EXAM: CT HEAD WITHOUT CONTRAST CT CERVICAL SPINE WITHOUT CONTRAST TECHNIQUE: Multidetector CT imaging of the head and cervical spine was performed following the standard protocol without  intravenous contrast. Multiplanar CT image reconstructions of the cervical spine were also generated. RADIATION DOSE REDUCTION: This exam was performed according to the departmental dose-optimization program which includes automated exposure control, adjustment of the mA and/or kV according to patient size and/or use of iterative reconstruction technique. COMPARISON:  05/20/2021 FINDINGS: CT HEAD FINDINGS Brain: No evidence of acute infarction, hemorrhage, hydrocephalus, extra-axial collection or mass lesion/mass effect. Mild subcortical white matter and periventricular small vessel ischemic changes. Vascular: Intracranial atherosclerosis. Skull: Normal. Negative for fracture or focal lesion. Sinuses/Orbits: The visualized paranasal sinuses are essentially clear. The mastoid air cells are unopacified. Other: None. CT CERVICAL SPINE FINDINGS Alignment: Normal cervical lordosis. Skull base and vertebrae: No acute fracture. No primary bone lesion or focal pathologic process. Soft tissues and spinal canal: No prevertebral fluid or swelling. No visible canal hematoma. Disc levels: Mild degenerative changes of the mid cervical spine. Spinal canal is patent. Upper chest: Evaluated on dedicated CT chest. Other: None. IMPRESSION: No evidence of acute intracranial abnormality. Mild small vessel ischemic changes. No acute traumatic injury to the cervical spine. Mild degenerative changes. Electronically Signed   By: Julian Hy M.D.   On: 08/13/2021 00:21   CT CHEST ABDOMEN PELVIS W CONTRAST  Result Date: 08/13/2021 CLINICAL DATA:  Fall, dementia EXAM: CT CHEST, ABDOMEN, AND PELVIS WITH CONTRAST TECHNIQUE: Multidetector CT imaging of the chest, abdomen and pelvis was performed following the standard protocol during bolus administration of intravenous contrast. RADIATION DOSE REDUCTION: This exam was performed according to the departmental dose-optimization program which includes automated exposure control, adjustment  of the mA and/or kV according to patient size and/or use of iterative reconstruction technique. CONTRAST:  121m OMNIPAQUE IOHEXOL 300 MG/ML  SOLN COMPARISON:  CT abdomen/pelvis dated 03/16/2020. CT chest dated 12/02/2017. FINDINGS: CT CHEST FINDINGS Cardiovascular: Heart is normal in size.  No pericardial effusion. No evidence of thoracic aortic aneurysm. Mild atherosclerotic calcifications the aortic root/arch. Mild three-coronary sclerosis. Mediastinum/Nodes: No suspicious mediastinal lymphadenopathy. Bilateral thyroid nodules, measuring up to 16 mm on the left. Given the patient's age and comorbidities, no follow-up is recommended. Lungs/Pleura: Biapical pleural-parenchymal scarring. Mild centrilobular and paraseptal emphysematous changes, upper lung predominant. 9 mm nodule with adjacent 13 mm thin-walled cyst in the posteromedial right lower lobe (series 6/image 110), new from 2022. Surrounding post infectious/inflammatory scarring in the medial right lower lobe. Scattered areas of bronchiectasis, including in the bilateral upper lobes, medial right middle lobe, and lingula. No pleural effusion or pneumothorax. Musculoskeletal: Visualized osseous structures are within normal limits. No fracture is seen. CT ABDOMEN PELVIS FINDINGS Hepatobiliary: Left hepatic cysts, measuring up to 19 mm (series 2/image 82), benign. Gallbladder is unremarkable. No intrahepatic or extrahepatic ductal dilatation. Pancreas: Within normal limits. Spleen: Within normal limits. Adrenals/Urinary Tract: Stable 2.4 cm left adrenal nodule (series 2/image 6), compatible with a benign adrenal adenoma. No follow-up is recommended. Right adrenal glands are within normal limits. Subcentimeter interpolar left renal cyst (series 2/image 63). Right kidney is within normal limits. No hydronephrosis. Bladder  is mildly thick-walled although underdistended. Stomach/Bowel: Stomach is notable for a tiny hiatal hernia. No evidence of bowel obstruction.  Normal appendix (series 2/image 101). Extensive colonic diverticulosis, without evidence of diverticulitis. Vascular/Lymphatic: No evidence of abdominal aortic aneurysm. Atherosclerotic calcifications of the abdominal aorta and branch vessels. Retroaortic left renal vein. No suspicious abdominopelvic lymphadenopathy. Reproductive: Uterus is within normal limits. Bilateral ovaries are within normal limits. Other: No abdominopelvic ascites. Musculoskeletal: Mild superior endplate compression fracture deformity at L2, unchanged. No fracture is seen. IMPRESSION: No evidence of traumatic injury to the chest, abdomen, or pelvis. 9 mm right lower lobe pulmonary nodule, new from 2022. While this may be infectious/inflammatory, a small primary bronchogenic neoplasm is not excluded. Given the patient's age and comorbidities, it is unclear that aggressive follow-up is required. Consider CT chest in 6-12 months if clinically warranted. Additional ancillary findings as above. Electronically Signed   By: Julian Hy M.D.   On: 08/13/2021 00:30   US Abdomen Limited RUQ (LIVER/GB)  Result Date: 08/13/2021 CLINICAL DATA:  Elevated LFTs EXAM: ULTRASOUND ABDOMEN LIMITED RIGHT UPPER QUADRANT COMPARISON:  CT chest abdomen and pelvis 08/13/2021 FINDINGS: Gallbladder: There is a 1.2 x 0.8 by 0.6 cm nonshadowing echogenic area adjacent to the gallbladder wall, non mobile. This has a ball on the wall appearance. This is favored as a polyp. There is no gallbladder wall thickening or pericholecystic fluid. No cholelithiasis. Sonographic Percell Miller sign is negative. Common bile duct: Diameter: 6 mm Liver: Two hepatic cysts are identified in the left lobe of the liver. One measures 9 x 7 x 9 mm in the other measures 2.3 x 1.6 x 2.0 cm. Within normal limits in parenchymal echogenicity. Portal vein is patent on color Doppler imaging with normal direction of blood flow towards the liver. Other: None. IMPRESSION: 1. 1.2 cm gallbladder polyp.  Follow-up ultrasound recommended in 6 months. 2. Hepatic cysts. Electronically Signed   By: Ronney Asters M.D.   On: 08/13/2021 01:38        Scheduled Meds:  aspirin EC  81 mg Oral Daily   diltiazem  120 mg Oral Daily   magnesium oxide  200 mg Oral Daily   mirtazapine  15 mg Oral QHS   Continuous Infusions:  cefTRIAXone (ROCEPHIN)  IV 1 g (08/13/21 2029)   lactated ringers 75 mL/hr at 08/14/21 0240          Aline August, MD [facility] 08/14/2021, 10:49 AM

## 2021-08-14 NOTE — TOC Progression Note (Signed)
Transition of Care [facility]) - Progression Note    Patient Details  Name: Melinda Edwards MRN: [id number] Date of Birth: 06-18-1926  Transition of [facility] CM/SW Contact  Ladarren Steiner, Juliann Pulse, RN Phone Number: 08/14/2021, 1:55 PM  Clinical Narrative:  Patient for return back to [place] w/[facility] HHPT/OT-rep [facility] aware. [facility] following for home 02 to deliver t rm prior d/c & set up @ facility. Tiny(caregiver) has own transport. [facility] unable to accept over weekend.    Expected Discharge Plan: Assisted Living Barriers to Discharge: Continued Medical Work up  Expected Discharge Plan and Services Expected Discharge Plan: Assisted Living   Discharge Planning Services: CM Consult Post Acute Care Choice:  (ALF) Living arrangements for the past 2 months: Assisted Living Facility                                       Social Determinants of Health (SDOH) Interventions    Readmission Risk Interventions     View : No data to display.

## 2021-08-14 NOTE — Progress Notes (Signed)
Telemetry called at 0320 to report the patient converted to A-Fib at 0119 (patient has hx of A-Fib). 12-Lead EKG performed to confirm. Current V/S: 117/75, 90, 16, 98% (2L), 98.4 (Oral). [place], NP made aware and no new orders given.

## 2021-08-14 NOTE — Progress Notes (Signed)
[facility] Gastroenterology Progress Note  Korrin Waterfield Milledge 88 y.o. 11-13-1926  CC:  Elevated LFTs   Subjective: Patient seen and examined at bedside. No events overnight. Caregiver also at bedside. Patient tolerating diet without difficulty.  ROS : Review of Systems  Unable to perform ROS: Dementia     Objective: Vital signs in last 24 hours: Vitals:   08/13/21 2030 08/14/21 0431  BP: 134/78 117/75  Pulse: 80 90  Resp: 15 16  Temp: 98.2 F (36.8 C) 98.4 F (36.9 C)  SpO2: 97% 98%    Physical Exam:  General:  Alert, cooperative, no distress, appears stated age  Head:  Normocephalic, without obvious abnormality, atraumatic  Eyes:  Anicteric sclera, EOM's intact  Lungs:   Clear to auscultation bilaterally, respirations unlabored  Heart:  Regular rate and rhythm, S1, S2 normal  Abdomen:   Soft, non-tender, bowel sounds active all four quadrants,  no masses,     Lab Results: Recent Labs    08/13/21 0230 08/13/21 0510 08/14/21 0451  NA  --  143 141  K  --  3.7 3.6  CL  --  109 108  CO2  --  25 26  GLUCOSE  --  123* 113*  BUN  --  22 17  CREATININE  --  0.66 0.56  CALCIUM  --  9.1 8.6*  MG 1.8 2.4  --    Recent Labs    08/13/21 0510 08/14/21 0451  AST 722* 203*  ALT 1,079* 591*  ALKPHOS 180* 149*  BILITOT 1.6* 1.0  PROT 7.1 6.5  ALBUMIN 3.5 3.1*   Recent Labs    08/12/21 2300 08/13/21 0510 08/14/21 0451  WBC 8.8 9.7 9.3  NEUTROABS 6.8 7.7  --   HGB 13.3 13.4 13.2  HCT 41.6 42.2 41.7  MCV 86.1 86.5 87.2  PLT 224 221 192   Recent Labs    08/13/21 0022 08/13/21 0510  LABPROT 15.0 14.1  INR 1.2 1.1      Assessment Transaminitis -AST 203/ ALT 591/ Alk phos 149, trending down - T. Bili 0.3, trending down -BUN 17, creatinine 0.56 -No leukocytosis -No anemia -Hepatitis panel negative - Ethanol and acetaminophen level negative -Magnesium 2.4 -GGT 135 -CT chest abdomen pelvis with contrast: Left hepatic cysts, unremarkable gallbladder,  diverticulosis. -RUQ ultrasound: 1.2 cm gallbladder polyp, hepatic cysts   Plan: LFTs are continuing to trend down. Negative hepatitis panel. Most likely experienced ischemic hepatitis/shock liver that is beginning to resolve. Continue to monitor LFTs Continue supportive care No further GI workup planned at this time. [facility] will sign off. Please contact us if we can be of any further assistance during this hospital stay.   Garnette Scheuermann PA-C 08/14/2021, 8:46 AM  Contact #  [phone]

## 2021-08-14 NOTE — NC FL2 (Deleted)
[place] LEVEL OF CARE SCREENING TOOL     IDENTIFICATION  Patient Name: Melinda Edwards Birthdate: Oct 05, 1926 Sex: female Admission Date (Current Location): 08/12/2021  [facility] and [place] Number:  Herbalist and Address:  [facility],  [place] [address] [place]      Provider Number: [id number]  Attending Physician Name and Address:  Aline August, MD  Relative Name and Phone Number:  Tiny (caregiver) [phone]    Current Level of Care: Hospital Recommended Level of Care: [place] Prior Approval Number:    Date Approved/Denied:   PASRR Number:    Discharge Plan: Other (Comment) ([facility]-ALF)    Current Diagnoses: Patient Active Problem List   Diagnosis Date Noted   Acute respiratory failure with hypoxia ([place]) 08/14/2021   Transaminitis 08/13/2021   Constipation 08/13/2021   Acute UTI (urinary tract infection) 08/13/2021   Volume depletion 08/13/2021   Hemoglobin decreased 03/17/2020   Lumbar L2 compression fracture, with routine healing, subsequent encounter 03/17/2020   Alzheimer's dementia ([place])    GI bleed 03/16/2020   Paroxysmal atrial fibrillation ([place]) 05/14/2019   CVA (cerebral vascular accident) ([place]) 05/10/2019   Essential hypertension 05/10/2019   Aortic stenosis, mild 04/13/2011   Benign hypertensive heart disease without heart failure 08/13/2010   Hyperlipidemia 08/13/2010   Bronchiectasis without acute exacerbation ([place]) 11/15/2007    Orientation RESPIRATION BLADDER Height & Weight     Self  O2 Incontinent Weight: 65.2 kg Height:  5' 4.5" (163.8 cm)  BEHAVIORAL SYMPTOMS/MOOD NEUROLOGICAL BOWEL NUTRITION STATUS      Incontinent Diet (Heart Healthy)  AMBULATORY STATUS COMMUNICATION OF NEEDS Skin   Limited Assist   Normal                       Personal Care Assistance Level of Assistance  Bathing, Feeding, Dressing Bathing Assistance: Limited assistance Feeding  assistance: Limited assistance Dressing Assistance: Limited assistance     Functional Limitations Info  Sight, Hearing, Speech Sight Info: Impaired (eyeglasses) Hearing Info: Impaired (Bilateral hearing aids.) Speech Info: Adequate    SPECIAL CARE FACTORS FREQUENCY                       Contractures Contractures Info: Not present    Additional Factors Info  Code Status, Allergies Code Status Info:  (DNR) Allergies Info:  (Antihistamines, Loratadine-type, Loratadine, Namenda (Memantine)           Current Medications (08/14/2021):  This is the current hospital active medication list Current Facility-Administered Medications  Medication Dose Route Frequency Provider Last Rate Last Admin   acetaminophen (TYLENOL) tablet 650 mg  650 mg Oral Q6H PRN Howerter, Justin B, DO       Or   acetaminophen (TYLENOL) suppository 650 mg  650 mg Rectal Q6H PRN Howerter, Justin B, DO       aspirin EC tablet 81 mg  81 mg Oral Daily Reubin Milan, MD   81 mg at 08/14/21 0825   cefTRIAXone (ROCEPHIN) 1 g in sodium chloride 0.9 % 100 mL IVPB  1 g Intravenous Q24H Reubin Milan, MD 200 mL/hr at 08/13/21 2029 1 g at 08/13/21 2029   diltiazem (CARDIZEM CD) 24 hr capsule 120 mg  120 mg Oral Daily Reubin Milan, MD   120 mg at 08/14/21 0826   ipratropium-albuterol (DUONEB) 0.5-2.5 (3) MG/3ML nebulizer solution 3 mL  3 mL Nebulization Q6H PRN Olevia Bowens,  Gerri Lins, MD       lactated ringers infusion   Intravenous Continuous Aline August, MD 75 mL/hr at 08/14/21 0240 New Bag at 08/14/21 0240   magnesium oxide (MAG-OX) tablet 200 mg  200 mg Oral Daily Reubin Milan, MD   200 mg at 08/14/21 4818   mirtazapine (REMERON) tablet 15 mg  15 mg Oral QHS Reubin Milan, MD   15 mg at 08/13/21 2125   naloxone [facility]) injection 0.4 mg  0.4 mg Intravenous PRN Howerter, Justin B, DO       ondansetron (ZOFRAN) injection 4 mg  4 mg Intravenous Q6H PRN Howerter, Justin B, DO          Discharge Medications: Please see discharge summary for a list of discharge medications.  Relevant Imaging Results:  Relevant Lab Results:   Additional Information SSN# [id number]  Dessa Phi, RN

## 2021-08-14 NOTE — Evaluation (Addendum)
Physical Therapy Evaluation Patient Details Name: Melinda Edwards MRN: [id number] DOB: Oct 24, 1926 Today's Date: 08/14/2021  History of Present Illness  88 y.o. female who had a fall at her residential facility and was brought to the ED at [facility]. admitted with UTI, transaminitis.   PMH: Alzheimer's dementia, paroxysmal atrial fibrillation, hypertension, pulmonary fibrosis, bronchiectasis, pulmonary nodule, history of other nonhemorrhagic stroke, mild aortic stenosis  Clinical Impression  Pt admitted with above diagnosis.  Pt with limited participation/cooperation. Pt deficits more cognitive than physical this PT session, recommend SNF  vs return to ALF with HHPT;unsure how much care pt was receiving at ALF and pt is unreliable historian. Pt HR elevated at 127 with EOB attempts.  Will continue to follow in acute setting, attempt to progress mobility as able.  Pt currently with functional limitations due to the deficits listed below (see PT Problem List). Pt will benefit from skilled PT to increase their independence and safety with mobility to allow discharge to the venue listed below.          Recommendations for follow up therapy are one component of a multi-disciplinary discharge planning process, led by the attending physician.  Recommendations may be updated based on patient status, additional functional criteria and insurance authorization.  Follow Up Recommendations Skilled nursing-short term rehab (<3 hours/day) (vs return to ALF with HHPT)    Assistance Recommended at Discharge Frequent or constant Supervision/Assistance  Patient can return home with the following  A lot of help with bathing/dressing/bathroom;A lot of help with walking and/or transfers;Assistance with cooking/housework;Assistance with feeding;Direct supervision/assist for financial management;Direct supervision/assist for medications management;Help with stairs or ramp for entrance    Equipment Recommendations None  recommended by PT  Recommendations for Other Services       Functional Status Assessment Patient has had a recent decline in their functional status and demonstrates the ability to make significant improvements in function in a reasonable and predictable amount of time.     Precautions / Restrictions Precautions Precautions: Fall Restrictions Weight Bearing Restrictions: No      Mobility  Bed Mobility Overal bed mobility: Needs Assistance Bed Mobility: Supine to Sit, Sit to Supine     Supine to sit: Max assist Sit to supine: Min assist   General bed mobility comments: pt is overall unccoperative wtih mobility. assisted with transition to EOB with max assist, very little effort to assist self. pt shakes her head no and returns to supine, lifting her LEs on to bed of her own volition    Transfers                   General transfer comment: pt unwilling/refused    Ambulation/Gait               General Gait Details: refused  Stairs            Wheelchair Mobility    Modified Rankin (Stroke Patients Only)       Balance Overall balance assessment: Needs assistance Sitting-balance support: No upper extremity supported, Feet supported Sitting balance-Leahy Scale: Fair Sitting balance - Comments: pt is briefly able to balance on EOB without support;       Standing balance comment: NT                             Pertinent Vitals/Pain Pain Assessment Pain Assessment: Faces Faces Pain Scale: No hurt    Home Living Family/patient expects to be discharged  to:: Assisted living                 Home Equipment: Conservation officer, nature (2 wheels) Additional Comments: pt is from [place]    Prior Function Prior Level of Function : Patient poor historian/Family not available             Mobility Comments: pt reports she walks "some-not much", uses RW (unsure of accuracy)       Hand Dominance        Extremity/Trunk  Assessment   Upper Extremity Assessment Upper Extremity Assessment: Defer to OT evaluation    Lower Extremity Assessment Lower Extremity Assessment: Generalized weakness       Communication   Communication: HOH  Cognition Arousal/Alertness: Awake/alert Behavior During Therapy: Flat affect Overall Cognitive Status: History of cognitive impairments - at baseline                                          General Comments      Exercises     Assessment/Plan    PT Assessment Patient needs continued PT services  PT Problem List Decreased strength;Decreased mobility;Decreased safety awareness;Decreased activity tolerance;Decreased balance;Decreased cognition       PT Treatment Interventions DME instruction;Therapeutic exercise;Gait training;Functional mobility training;Therapeutic activities;Patient/family education    PT Goals (Current goals can be found in the Care Plan section)  Acute Rehab PT Goals Patient Stated Goal: does not state PT Goal Formulation: Patient unable to participate in goal setting Time For Goal Achievement: 08/28/21 Potential to Achieve Goals: Fair    Frequency Min 2X/week     Co-evaluation               AM-PAC PT "6 Clicks" Mobility  Outcome Measure Help needed turning from your back to your side while in a flat bed without using bedrails?: Total Help needed moving from lying on your back to sitting on the side of a flat bed without using bedrails?: Total Help needed moving to and from a bed to a chair (including a wheelchair)?: Total Help needed standing up from a chair using your arms (e.g., wheelchair or bedside chair)?: Total Help needed to walk in hospital room?: Total Help needed climbing 3-5 steps with a railing? : Total 6 Click Score: 6    End of Session   Activity Tolerance: Other (comment) (limited by cognition) Patient left: in bed;with call bell/phone within reach;with bed alarm set   PT Visit Diagnosis:  Other abnormalities of gait and mobility (R26.89);Difficulty in walking, not elsewhere classified (R26.2);Muscle weakness (generalized) (M62.81)    Time: 6503-5465 PT Time Calculation (min) (ACUTE ONLY): 12 min   Charges:   PT Evaluation $PT Eval Low Complexity: [place], PT  Acute Rehab Dept ([facility]) [phone] Pager [phone]  08/14/2021   [facility] 08/14/2021, 10:50 AM

## 2021-08-14 NOTE — NC FL2 (Signed)
[place] LEVEL OF CARE SCREENING TOOL     IDENTIFICATION  Patient Name: Melinda Edwards Birthdate: 03/15/1927 Sex: female Admission Date (Current Location): 08/12/2021  [facility] [place] Number:  Herbalist and Address:  [facility],  [place] [address], [place]      Provider Number: [id number]  Attending Physician Name and Address:  Aline August, MD  Relative Name and Phone Number:  Tiny (caregiver) [phone]    Current Level of Care: Hospital Recommended Level of Care: [place] Prior Approval Number:    Date Approved/Denied:   PASRR Number:    Discharge Plan: Other (Comment) ([facility]-ALF)    Current Diagnoses: Patient Active Problem List   Diagnosis Date Noted   Acute respiratory failure with hypoxia ([place]) 08/14/2021   Transaminitis 08/13/2021   Constipation 08/13/2021   Acute UTI (urinary tract infection) 08/13/2021   Volume depletion 08/13/2021   Hemoglobin decreased 03/17/2020   Lumbar L2 compression fracture, with routine healing, subsequent encounter 03/17/2020   Alzheimer's dementia ([place])    GI bleed 03/16/2020   Paroxysmal atrial fibrillation ([place]) 05/14/2019   CVA (cerebral vascular accident) ([place]) 05/10/2019   Essential hypertension 05/10/2019   Aortic stenosis, mild 04/13/2011   Benign hypertensive heart disease without heart failure 08/13/2010   Hyperlipidemia 08/13/2010   Bronchiectasis without acute exacerbation ([place]) 11/15/2007    Orientation RESPIRATION BLADDER Height & Weight     Self  O2 Incontinent Weight: 65.2 kg Height:  5' 4.5" (163.8 cm)  BEHAVIORAL SYMPTOMS/MOOD NEUROLOGICAL BOWEL NUTRITION STATUS      Incontinent Diet (Heart Healthy)  AMBULATORY STATUS COMMUNICATION OF NEEDS Skin   Limited Assist   Normal                       Personal Care Assistance Level of Assistance  Bathing, Feeding, Dressing Bathing Assistance: Limited assistance Feeding  assistance: Limited assistance Dressing Assistance: Limited assistance     Functional Limitations Info  Sight, Hearing, Speech Sight Info: Impaired (eyeglasses) Hearing Info: Impaired (Bilateral hearing aids.) Speech Info: Adequate    SPECIAL CARE FACTORS FREQUENCY        PT Frequency:  (2x week) OT Frequency:  (2x week)            Contractures Contractures Info: Not present    Additional Factors Info  Code Status, Allergies Code Status Info:  (DNR) Allergies Info:  (Antihistamines, Loratadine-type, Loratadine, Namenda ([place])           Current Medications (08/14/2021):  This is the current hospital active medication list Current Facility-Administered Medications  Medication Dose Route Frequency Provider Last Rate Last Admin   acetaminophen (TYLENOL) tablet 650 mg  650 mg Oral Q6H PRN Howerter, Justin B, DO       Or   acetaminophen (TYLENOL) suppository 650 mg  650 mg Rectal Q6H PRN Howerter, Justin B, DO       aspirin EC tablet 81 mg  81 mg Oral Daily Reubin Milan, MD   81 mg at 08/14/21 0825   cefTRIAXone (ROCEPHIN) 1 g in sodium chloride 0.9 % 100 mL IVPB  1 g Intravenous Q24H Reubin Milan, MD 200 mL/hr at 08/13/21 2029 1 g at 08/13/21 2029   diltiazem (CARDIZEM CD) 24 hr capsule 120 mg  120 mg Oral Daily Reubin Milan, MD   120 mg at 08/14/21 0826   ipratropium-albuterol (DUONEB) 0.5-2.5 (3) MG/3ML nebulizer solution 3 mL  3 mL Nebulization Q6H PRN Reubin Milan, MD       lactated ringers infusion   Intravenous Continuous Aline August, MD 75 mL/hr at 08/14/21 0240 New Bag at 08/14/21 0240   magnesium oxide (MAG-OX) tablet 200 mg  200 mg Oral Daily Reubin Milan, MD   200 mg at 08/14/21 6433   mirtazapine (REMERON) tablet 15 mg  15 mg Oral QHS Reubin Milan, MD   15 mg at 08/13/21 2125   naloxone [facility]) injection 0.4 mg  0.4 mg Intravenous PRN Howerter, Justin B, DO       ondansetron (ZOFRAN) injection 4 mg  4 mg Intravenous  Q6H PRN Howerter, Justin B, DO         Discharge Medications: Please see discharge summary for a list of discharge medications.  Relevant Imaging Results:  Relevant Lab Results:   Additional Information SSN# [id number]  Dessa Phi, RN

## 2021-08-15 DIAGNOSIS — N390 Urinary tract infection, site not specified: Secondary | ICD-10-CM | POA: Diagnosis not present

## 2021-08-15 DIAGNOSIS — K7200 Acute and subacute hepatic failure without coma: Secondary | ICD-10-CM | POA: Diagnosis not present

## 2021-08-15 DIAGNOSIS — J8410 Pulmonary fibrosis, unspecified: Secondary | ICD-10-CM | POA: Diagnosis not present

## 2021-08-15 DIAGNOSIS — G309 Alzheimer's disease, unspecified: Secondary | ICD-10-CM | POA: Diagnosis not present

## 2021-08-15 DIAGNOSIS — J9601 Acute respiratory failure with hypoxia: Secondary | ICD-10-CM | POA: Diagnosis not present

## 2021-08-15 DIAGNOSIS — I10 Essential (primary) hypertension: Secondary | ICD-10-CM | POA: Diagnosis not present

## 2021-08-15 DIAGNOSIS — J479 Bronchiectasis, uncomplicated: Secondary | ICD-10-CM | POA: Diagnosis not present

## 2021-08-15 DIAGNOSIS — I480 Paroxysmal atrial fibrillation: Secondary | ICD-10-CM | POA: Diagnosis not present

## 2021-08-15 DIAGNOSIS — E785 Hyperlipidemia, unspecified: Secondary | ICD-10-CM | POA: Diagnosis not present

## 2021-08-15 DIAGNOSIS — Z66 Do not resuscitate: Secondary | ICD-10-CM | POA: Diagnosis not present

## 2021-08-15 DIAGNOSIS — K559 Vascular disorder of intestine, unspecified: Secondary | ICD-10-CM | POA: Diagnosis not present

## 2021-08-15 DIAGNOSIS — R7401 Elevation of levels of liver transaminase levels: Secondary | ICD-10-CM | POA: Diagnosis not present

## 2021-08-15 DIAGNOSIS — F0280 Dementia in other diseases classified elsewhere without behavioral disturbance: Secondary | ICD-10-CM | POA: Diagnosis not present

## 2021-08-15 LAB — COMPREHENSIVE METABOLIC PANEL
ALT: 321 U/L — ABNORMAL HIGH (ref 0–44)
AST: 66 U/L — ABNORMAL HIGH (ref 15–41)
Albumin: 2.8 g/dL — ABNORMAL LOW (ref 3.5–5.0)
Alkaline Phosphatase: 125 U/L (ref 38–126)
Anion gap: 7 (ref 5–15)
BUN: 17 mg/dL (ref 8–23)
CO2: 25 mmol/L (ref 22–32)
Calcium: 8.3 mg/dL — ABNORMAL LOW (ref 8.9–10.3)
Chloride: 106 mmol/L (ref 98–111)
Creatinine, Ser: 0.64 mg/dL (ref 0.44–1.00)
GFR, Estimated: >60 mL/min (ref >60)
Glucose, Bld: 112 mg/dL — ABNORMAL HIGH (ref 70–99)
Potassium: 3.4 mmol/L — ABNORMAL LOW (ref 3.5–5.1)
Sodium: 138 mmol/L (ref 135–145)
Total Bilirubin: 0.9 mg/dL (ref 0.3–1.2)
Total Protein: 6.3 g/dL — ABNORMAL LOW (ref 6.5–8.1)

## 2021-08-15 LAB — URINE CULTURE: Culture: >=100000 — AB

## 2021-08-15 LAB — MAGNESIUM: Magnesium: 1.9 mg/dL (ref 1.7–2.4)

## 2021-08-15 MED ORDER — CEPHALEXIN 500 MG PO CAPS
500.0000 mg | ORAL_CAPSULE | Freq: Two times a day (BID) | ORAL | Status: DC
  Administered 2021-08-15 – 2021-08-17 (×4): 500 mg via ORAL
  Filled 2021-08-15 (×4): qty 1

## 2021-08-15 MED ORDER — POTASSIUM CHLORIDE CRYS ER 20 MEQ PO TBCR
40.0000 meq | EXTENDED_RELEASE_TABLET | Freq: Once | ORAL | Status: AC
  Administered 2021-08-15: 40 meq via ORAL
  Filled 2021-08-15: qty 2

## 2021-08-15 NOTE — Plan of Care (Signed)
  Problem: Coping: Goal: Level of anxiety will decrease Outcome: Progressing   Problem: Pain Managment: Goal: General experience of comfort will improve Outcome: Progressing   Problem: Safety: Goal: Ability to remain free from injury will improve Outcome: Progressing   Problem: Skin Integrity: Goal: Risk for impaired skin integrity will decrease Outcome: Progressing   

## 2021-08-15 NOTE — TOC Progression Note (Addendum)
Transition of Care [facility]) - Progression Note    Patient Details  Name: KARLISA GAUBERT MRN: [id number] Date of Birth: 13-Aug-1926  [facility]) CM/SW Contact  Cecil Cobbs Phone Number: 08/15/2021, 2:23 PM  Clinical Narrative:     Patient is from [place].  CSW received a message that patient's private caregiver said patient can return over the weekend, CSW informed bedside nurse, that CSW needs to hear from the DON at ALF to confirm if patient can return or not.    CSW attempted to contact director of nursing Birdie Sons [phone] to find out if patient can return over the weekend.  CSW had to leave a message awaiting call back.  Per RN case manager note from yesterday, patient can not return till Monday.  CSW updated bedside nurse and attending physician.     Expected Discharge Plan: Assisted Living Barriers to Discharge: Continued Medical Work up  Expected Discharge Plan and Services Expected Discharge Plan: Assisted Living   Discharge Planning Services: CM Consult Post Acute Care Choice:  (ALF) Living arrangements for the past 2 months: Assisted Living Facility                                       Social Determinants of Health (SDOH) Interventions    Readmission Risk Interventions     View : No data to display.

## 2021-08-15 NOTE — Progress Notes (Signed)
PROGRESS NOTE    Melinda Edwards  FKC:[id number] DOB: 11-06-1926 DOA: 08/12/2021 PCP: Lajean Manes, MD   Brief Narrative:  88 y.o. female with medical history significant of Alzheimer's dementia, paroxysmal atrial fibrillation, hypertension, pulmonary fibrosis, bronchiectasis, pulmonary nodule, history of other nonhemorrhagic stroke, mild aortic stenosis was brought to the emergency department because of a fall at her residential facility.  Work-up revealed a transaminitis with hyperbilirubinemia along with possible UTI.  CT of the head was negative for acute intracranial abnormality.  CT cervical spine was negative for traumatic injury.  CT chest now shows new 9 mm right lower lobe pulmonary nodule with follow-up recommended in 6 to 12 months if clinically warranted.  Ultrasound of the gallbladder showed a 1.2 cm polyp with recommendations to follow-up in 6 months.  She also has hepatic cysts.  She was started on IV Rocephin.  GI was consulted.  Assessment & Plan:   Possible UTI: Present on admission -Follow urine cultures, growing Proteus mirabilis.  Follow sensitivities.  Continue Rocephin  Acute transaminitis -Possibly from ischemic enteritis/shock liver.  Hepatitis panel negative.  LFTs improving.  Monitor.  GI signed off on 08/14/2021  Fall at residential facility -Imaging negative for acute intracranial abnormity or cervical spine fracture. -Fall precautions.  PT recommends SNF placement versus ALF with HHPT.  As per [facility], patient can be accepted back to ALF on Monday.  Volume depletion -Treated with IV fluids.  Off iv fluids  Bronchiectasis  Acute respiratory failure with hypoxia -Continue bronchodilators as needed.  Improved.  Currently on room air.  Hyperlipidemia -No longer on atorvastatin.  Contraindicated at this time because of acute liver injury  Essential hypertension -Blood pressure currently stable.  Continue diltiazem  Paroxysmal A-fib -Currently rate controlled.   Continue aspirin.  Not on anticoagulation as an outpatient.  Continue Cardizem  Alzheimer's dementia -Continue supportive care.  Continue Remeron.    DVT prophylaxis: Start Lovenox Code Status: DNR Family Communication: Caregiver at bedside Disposition Plan: Status is: inpatient because: Of need for IV antibiotics.    Consultants: GI  Procedures: None  Antimicrobials: Rocephin from 08/13/2021 onwards   Subjective: Patient seen and examined at bedside.  Confused.  No agitation, vomiting, seizures or fever reported. Objective: Vitals:   08/14/21 1600 08/14/21 2111 08/15/21 0429 08/15/21 0432  BP:  134/72 104/76   Pulse:  (!) 109 (!) 102   Resp: '20 19 19   '$ Temp:  98.3 F (36.8 C) 98.9 F (37.2 C)   TempSrc:  Oral Oral   SpO2:  92% 94%   Weight:    64.4 kg  Height:        Intake/Output Summary (Last 24 hours) at 08/15/2021 0754 Last data filed at 08/15/2021 0431 Gross per 24 hour  Intake 565.54 ml  Output 400 ml  Net 165.54 ml    Filed Weights   08/13/21 0404 08/14/21 0437 08/15/21 0432  Weight: 61.5 kg 65.2 kg 64.4 kg    Examination:  General: On room air.  No distress.  Elderly female lying in bed.  Chronically ill and deconditioned looking. ENT/neck: No thyromegaly.  JVD is not elevated  respiratory: Decreased breath sounds at bases bilaterally with some crackles; no wheezing  CVS: S1-S2 heard, intermittently tachycardic Abdominal: Soft, nontender, slightly distended; no organomegaly, normal bowel sounds are heard Extremities: Trace lower extremity edema; no cyanosis  CNS: Awake but confused.  No focal neurologic deficit.  Moves extremities Lymph: No obvious lymphadenopathy Skin: No obvious ecchymosis/lesions  psych: Currently not  agitated.  Affect is mostly flat musculoskeletal: No obvious joint swelling/deformity     Data Reviewed: I have personally reviewed following labs and imaging studies  CBC: Recent Labs  Lab 08/12/21 2300 08/13/21 0510  08/14/21 0451  WBC 8.8 9.7 9.3  NEUTROABS 6.8 7.7  --   HGB 13.3 13.4 13.2  HCT 41.6 42.2 41.7  MCV 86.1 86.5 87.2  PLT 224 221 213    Basic Metabolic Panel: Recent Labs  Lab 08/12/21 2300 08/13/21 0230 08/13/21 0510 08/14/21 0451 08/15/21 0534  NA 141  --  143 141 138  K 3.5  --  3.7 3.6 3.4*  CL 108  --  109 108 106  CO2 26  --  '25 26 25  '$ GLUCOSE 150*  --  123* 113* 112*  BUN 26*  --  '22 17 17  '$ CREATININE 0.73  --  0.66 0.56 0.64  CALCIUM 9.2  --  9.1 8.6* 8.3*  MG  --  1.8 2.4  --  1.9    GFR: Estimated Creatinine Clearance: 39.6 mL/min (by C-G formula based on SCr of 0.64 mg/dL). Liver Function Tests: Recent Labs  Lab 08/12/21 2300 08/13/21 0510 08/14/21 0451 08/15/21 0534  AST 1,027* 722* 203* 66*  ALT 1,256* 1,079* 591* 321*  ALKPHOS 190* 180* 149* 125  BILITOT 2.0* 1.6* 1.0 0.9  PROT 7.4 7.1 6.5 6.3*  ALBUMIN 3.7 3.5 3.1* 2.8*    Recent Labs  Lab 08/12/21 2300  LIPASE 37    No results for input(s): AMMONIA in the last 168 hours. Coagulation Profile: Recent Labs  Lab 08/13/21 0022 08/13/21 0510  INR 1.2 1.1    Cardiac Enzymes: No results for input(s): CKTOTAL, CKMB, CKMBINDEX, TROPONINI in the last 168 hours. BNP (last 3 results) No results for input(s): PROBNP in the last 8760 hours. HbA1C: No results for input(s): HGBA1C in the last 72 hours. CBG: No results for input(s): GLUCAP in the last 168 hours. Lipid Profile: No results for input(s): CHOL, HDL, LDLCALC, TRIG, CHOLHDL, LDLDIRECT in the last 72 hours. Thyroid Function Tests: No results for input(s): TSH, T4TOTAL, FREET4, T3FREE, THYROIDAB in the last 72 hours. Anemia Panel: No results for input(s): VITAMINB12, FOLATE, FERRITIN, TIBC, IRON, RETICCTPCT in the last 72 hours. Sepsis Labs: No results for input(s): PROCALCITON, LATICACIDVEN in the last 168 hours.  Recent Results (from the past 240 hour(s))  Urine Culture     Status: Abnormal (Preliminary result)   Collection Time:  08/13/21  2:45 PM   Specimen: Urine, Clean Catch  Result Value Ref Range Status   Specimen Description   Final    URINE, CLEAN CATCH Performed at [facility], [place] [address] [place], [place] [address]    Special Requests   Final    NONE Performed at [facility], [place] [address], [place], [place] [address]    Culture >=100,000 COLONIES/mL PROTEUS MIRABILIS (A)  Final   Report Status PENDING  Incomplete         Radiology Studies: No results found.      Scheduled Meds:  aspirin EC  81 mg Oral Daily   diltiazem  120 mg Oral Daily   magnesium oxide  200 mg Oral Daily   mirtazapine  15 mg Oral QHS   Continuous Infusions:  cefTRIAXone (ROCEPHIN)  IV 1 g (08/14/21 1815)          Aline August, MD [facility] 08/15/2021, 7:54 AM

## 2021-08-16 DIAGNOSIS — J9601 Acute respiratory failure with hypoxia: Secondary | ICD-10-CM | POA: Diagnosis not present

## 2021-08-16 DIAGNOSIS — G309 Alzheimer's disease, unspecified: Secondary | ICD-10-CM | POA: Diagnosis not present

## 2021-08-16 DIAGNOSIS — N390 Urinary tract infection, site not specified: Secondary | ICD-10-CM | POA: Diagnosis not present

## 2021-08-16 DIAGNOSIS — J479 Bronchiectasis, uncomplicated: Secondary | ICD-10-CM | POA: Diagnosis not present

## 2021-08-16 DIAGNOSIS — E785 Hyperlipidemia, unspecified: Secondary | ICD-10-CM | POA: Diagnosis not present

## 2021-08-16 DIAGNOSIS — F0280 Dementia in other diseases classified elsewhere without behavioral disturbance: Secondary | ICD-10-CM | POA: Diagnosis not present

## 2021-08-16 DIAGNOSIS — R7401 Elevation of levels of liver transaminase levels: Secondary | ICD-10-CM | POA: Diagnosis not present

## 2021-08-16 DIAGNOSIS — I480 Paroxysmal atrial fibrillation: Secondary | ICD-10-CM | POA: Diagnosis not present

## 2021-08-16 DIAGNOSIS — I10 Essential (primary) hypertension: Secondary | ICD-10-CM | POA: Diagnosis not present

## 2021-08-16 NOTE — Progress Notes (Signed)
PROGRESS NOTE    Melinda Edwards  URK:[id number] DOB: [birth year] DOA: 08/12/2021 PCP: Lajean Manes, MD   Brief Narrative:  88 y.o. female with medical history significant of Alzheimer's dementia, paroxysmal atrial fibrillation, hypertension, pulmonary fibrosis, bronchiectasis, pulmonary nodule, history of other nonhemorrhagic stroke, mild aortic stenosis was brought to the emergency department because of a fall at her residential facility.  Work-up revealed a transaminitis with hyperbilirubinemia along with possible UTI.  CT of the head was negative for acute intracranial abnormality.  CT cervical spine was negative for traumatic injury.  CT chest now shows new 9 mm right lower lobe pulmonary nodule with follow-up recommended in 6 to 12 months if clinically warranted.  Ultrasound of the gallbladder showed a 1.2 cm polyp with recommendations to follow-up in 6 months.  She also has hepatic cysts.  She was started on IV Rocephin.  GI was consulted.  LFTs downtrending.  GI subsequently signed off  Assessment & Plan:   Possible UTI: Present on admission -Follow urine cultures, growing Proteus mirabilis.  Rocephin has been switched to Keflex.  Finish 5-day course of therapy.  Acute transaminitis -Possibly from ischemic enteritis/shock liver.  Hepatitis panel negative.  LFTs improving, pending for today.  Monitor.  GI signed off on 08/14/2021  Fall at residential facility -Imaging negative for acute intracranial abnormity or cervical spine fracture. -Fall precautions.  PT recommends SNF placement versus ALF with HHPT.  As per [facility], patient can be accepted back to ALF on Monday.  Volume depletion -Treated with IV fluids.  Off iv fluids  Bronchiectasis  Acute respiratory failure with hypoxia -Continue bronchodilators as needed.  Improved.  Currently on room air.  Hyperlipidemia -No longer on atorvastatin.  Contraindicated at this time because of acute liver injury  Essential hypertension -Blood  pressure currently stable.  Continue diltiazem  Paroxysmal A-fib -Currently rate controlled.  Continue aspirin.  Not on anticoagulation as an outpatient.  Continue Cardizem  Alzheimer's dementia -Continue supportive care.  Continue Remeron.    DVT prophylaxis: Start Lovenox Code Status: DNR Family Communication: Caregiver at bedside on 08/15/2021 Disposition Plan: Status is: inpatient because: Currently medically stable for discharge to ALF.  Consultants: GI  Procedures: None  Antimicrobials:  Anti-infectives (From admission, onward)    Start     Dose/Rate Route Frequency Ordered Stop   08/15/21 1800  cephALEXin (KEFLEX) capsule 500 mg        500 mg Oral Every 12 hours 08/15/21 1235 08/18/21 1959   08/13/21 1830  cefTRIAXone (ROCEPHIN) 1 g in sodium chloride 0.9 % 100 mL IVPB  Status:  Discontinued        1 g 200 mL/hr over 30 Minutes Intravenous Every 24 hours 08/13/21 1727 08/15/21 1235        Subjective: Patient seen and examined at bedside.  Confused.  No seizures, agitation, fever or vomiting reported.   Objective: Vitals:   08/15/21 0432 08/15/21 1320 08/15/21 2133 08/16/21 0634  BP:  117/67 130/77 114/67  Pulse:  64 82 98  Resp:  '16 19 19  '$ Temp:  98.2 F (36.8 C) (!) 97.5 F (36.4 C) 98 F (36.7 C)  TempSrc:  Oral Oral Oral  SpO2:  95% 95% 95%  Weight: 64.4 kg   65.3 kg  Height:        Intake/Output Summary (Last 24 hours) at 08/16/2021 0756 Last data filed at 08/15/2021 2300 Gross per 24 hour  Intake 240 ml  Output 575 ml  Net -335 ml  Filed Weights   08/14/21 0437 08/15/21 0432 08/16/21 0634  Weight: 65.2 kg 64.4 kg 65.3 kg    Examination:  General: On room air.  No distress.  Chronically ill-appearing and deconditioned elderly female lying on bed.  Confused.  Does not participate in conversation much.  Extremely slow to respond. respiratory: Decreased breath sounds at bases bilaterally with some crackles CVS: Currently rate controlled; S1-S2  heard  abdominal: Soft, nontender, slightly distended, no organomegaly; normal bowel sounds are heard  extremities: Trace lower extremity edema; no clubbing.        Data Reviewed: I have personally reviewed following labs and imaging studies  CBC: Recent Labs  Lab 08/12/21 2300 08/13/21 0510 08/14/21 0451  WBC 8.8 9.7 9.3  NEUTROABS 6.8 7.7  --   HGB 13.3 13.4 13.2  HCT 41.6 42.2 41.7  MCV 86.1 86.5 87.2  PLT 224 221 710    Basic Metabolic Panel: Recent Labs  Lab 08/12/21 2300 08/13/21 0230 08/13/21 0510 08/14/21 0451 08/15/21 0534  NA 141  --  143 141 138  K 3.5  --  3.7 3.6 3.4*  CL 108  --  109 108 106  CO2 26  --  '25 26 25  '$ GLUCOSE 150*  --  123* 113* 112*  BUN 26*  --  '22 17 17  '$ CREATININE 0.73  --  0.66 0.56 0.64  CALCIUM 9.2  --  9.1 8.6* 8.3*  MG  --  1.8 2.4  --  1.9    GFR: Estimated Creatinine Clearance: 39.6 mL/min (by C-G formula based on SCr of 0.64 mg/dL). Liver Function Tests: Recent Labs  Lab 08/12/21 2300 08/13/21 0510 08/14/21 0451 08/15/21 0534  AST 1,027* 722* 203* 66*  ALT 1,256* 1,079* 591* 321*  ALKPHOS 190* 180* 149* 125  BILITOT 2.0* 1.6* 1.0 0.9  PROT 7.4 7.1 6.5 6.3*  ALBUMIN 3.7 3.5 3.1* 2.8*    Recent Labs  Lab 08/12/21 2300  LIPASE 37    No results for input(s): AMMONIA in the last 168 hours. Coagulation Profile: Recent Labs  Lab 08/13/21 0022 08/13/21 0510  INR 1.2 1.1    Cardiac Enzymes: No results for input(s): CKTOTAL, CKMB, CKMBINDEX, TROPONINI in the last 168 hours. BNP (last 3 results) No results for input(s): PROBNP in the last 8760 hours. HbA1C: No results for input(s): HGBA1C in the last 72 hours. CBG: No results for input(s): GLUCAP in the last 168 hours. Lipid Profile: No results for input(s): CHOL, HDL, LDLCALC, TRIG, CHOLHDL, LDLDIRECT in the last 72 hours. Thyroid Function Tests: No results for input(s): TSH, T4TOTAL, FREET4, T3FREE, THYROIDAB in the last 72 hours. Anemia Panel: No  results for input(s): VITAMINB12, FOLATE, FERRITIN, TIBC, IRON, RETICCTPCT in the last 72 hours. Sepsis Labs: No results for input(s): PROCALCITON, LATICACIDVEN in the last 168 hours.  Recent Results (from the past 240 hour(s))  Urine Culture     Status: Abnormal   Collection Time: 08/13/21  2:45 PM   Specimen: Urine, Clean Catch  Result Value Ref Range Status   Specimen Description   Final    URINE, CLEAN CATCH Performed at [facility], [place] [address] [place], [place] [address]    Special Requests   Final    NONE Performed at [facility], [place] [address] [place],  [address]    Culture >=100,000 COLONIES/mL PROTEUS MIRABILIS (A)  Final   Report Status 08/15/2021 FINAL  Final   Organism ID, Bacteria PROTEUS MIRABILIS (A)  Final      Susceptibility   Proteus mirabilis - MIC*    AMPICILLIN <=2 SENSITIVE Sensitive     CEFAZOLIN <=4 SENSITIVE Sensitive     CEFEPIME <=0.12 SENSITIVE Sensitive     CEFTRIAXONE <=0.25 SENSITIVE Sensitive     CIPROFLOXACIN <=0.25 SENSITIVE Sensitive     GENTAMICIN <=1 SENSITIVE Sensitive     IMIPENEM 4 SENSITIVE Sensitive     NITROFURANTOIN 128 RESISTANT Resistant     TRIMETH/SULFA <=20 SENSITIVE Sensitive     AMPICILLIN/SULBACTAM <=2 SENSITIVE Sensitive     PIP/TAZO <=4 SENSITIVE Sensitive     * >=100,000 COLONIES/mL PROTEUS MIRABILIS         Radiology Studies: No results found.      Scheduled Meds:  aspirin EC  81 mg Oral Daily   cephALEXin  500 mg Oral Q12H   diltiazem  120 mg Oral Daily   magnesium oxide  200 mg Oral Daily   mirtazapine  15 mg Oral QHS   Continuous Infusions:          Aline August, MD [facility] 08/16/2021, 7:56 AM

## 2021-08-16 NOTE — TOC Progression Note (Signed)
Transition of Care [facility]) - Progression Note    Patient Details  Name: Melinda Edwards MRN: [id number] Date of Birth: 1927-03-14  Transition of Care [facility]) CM/SW Contact  Ross Ludwig, [place] Phone Number: 08/16/2021, 1:00 PM  Clinical Narrative:     CSW still did not hear back from [place].  Weekday TOC worker to follow up on Monday with DON at [facility].  Updated attending physician.  Expected Discharge Plan: Assisted Living Barriers to Discharge: Continued Medical Work up  Expected Discharge Plan and Services Expected Discharge Plan: Assisted Living   Discharge Planning Services: CM Consult Post Acute Care Choice:  (ALF) Living arrangements for the past 2 months: Assisted Living Facility                                       Social Determinants of Health (SDOH) Interventions    Readmission Risk Interventions     View : No data to display.

## 2021-08-17 ENCOUNTER — Ambulatory Visit (HOSPITAL_BASED_OUTPATIENT_CLINIC_OR_DEPARTMENT_OTHER): Payer: Medicare PPO | Admitting: Nurse Practitioner

## 2021-08-17 DIAGNOSIS — R7401 Elevation of levels of liver transaminase levels: Secondary | ICD-10-CM | POA: Diagnosis not present

## 2021-08-17 DIAGNOSIS — J479 Bronchiectasis, uncomplicated: Secondary | ICD-10-CM | POA: Diagnosis not present

## 2021-08-17 DIAGNOSIS — F0280 Dementia in other diseases classified elsewhere without behavioral disturbance: Secondary | ICD-10-CM | POA: Diagnosis not present

## 2021-08-17 DIAGNOSIS — J8410 Pulmonary fibrosis, unspecified: Secondary | ICD-10-CM | POA: Diagnosis not present

## 2021-08-17 DIAGNOSIS — G309 Alzheimer's disease, unspecified: Secondary | ICD-10-CM | POA: Diagnosis not present

## 2021-08-17 DIAGNOSIS — N390 Urinary tract infection, site not specified: Secondary | ICD-10-CM | POA: Diagnosis not present

## 2021-08-17 DIAGNOSIS — E785 Hyperlipidemia, unspecified: Secondary | ICD-10-CM | POA: Diagnosis not present

## 2021-08-17 DIAGNOSIS — Z66 Do not resuscitate: Secondary | ICD-10-CM | POA: Diagnosis not present

## 2021-08-17 DIAGNOSIS — I480 Paroxysmal atrial fibrillation: Secondary | ICD-10-CM | POA: Diagnosis not present

## 2021-08-17 DIAGNOSIS — K559 Vascular disorder of intestine, unspecified: Secondary | ICD-10-CM | POA: Diagnosis not present

## 2021-08-17 DIAGNOSIS — I10 Essential (primary) hypertension: Secondary | ICD-10-CM | POA: Diagnosis not present

## 2021-08-17 DIAGNOSIS — K7200 Acute and subacute hepatic failure without coma: Secondary | ICD-10-CM | POA: Diagnosis not present

## 2021-08-17 DIAGNOSIS — J9601 Acute respiratory failure with hypoxia: Secondary | ICD-10-CM | POA: Diagnosis not present

## 2021-08-17 LAB — COMPREHENSIVE METABOLIC PANEL
ALT: 152 U/L — ABNORMAL HIGH (ref 0–44)
AST: 29 U/L (ref 15–41)
Albumin: 2.8 g/dL — ABNORMAL LOW (ref 3.5–5.0)
Alkaline Phosphatase: 109 U/L (ref 38–126)
Anion gap: 7 (ref 5–15)
BUN: 17 mg/dL (ref 8–23)
CO2: 25 mmol/L (ref 22–32)
Calcium: 8.5 mg/dL — ABNORMAL LOW (ref 8.9–10.3)
Chloride: 111 mmol/L (ref 98–111)
Creatinine, Ser: 0.67 mg/dL (ref 0.44–1.00)
GFR, Estimated: >60 mL/min (ref >60)
Glucose, Bld: 99 mg/dL (ref 70–99)
Potassium: 3.9 mmol/L (ref 3.5–5.1)
Sodium: 143 mmol/L (ref 135–145)
Total Bilirubin: 0.7 mg/dL (ref 0.3–1.2)
Total Protein: 6.4 g/dL — ABNORMAL LOW (ref 6.5–8.1)

## 2021-08-17 LAB — MAGNESIUM: Magnesium: 2.3 mg/dL (ref 1.7–2.4)

## 2021-08-17 MED ORDER — CEPHALEXIN 500 MG PO CAPS: 500.0000 mg | ORAL_CAPSULE | Freq: Two times a day (BID) | ORAL | 0 refills | Status: AC

## 2021-08-17 NOTE — TOC Transition Note (Addendum)
Transition of Care [facility]) - CM/SW Discharge Note   Patient Details  Name: Melinda Edwards MRN: [id number] Date of Birth: 07/25/1926  Transition of Care [facility]) CM/SW Contact:  Dessa Phi, RN Phone Number: 08/17/2021, 10:01 AM   Clinical Narrative:  d/c today back to ALF-[facility]-caregiver has own transport. [facility] HHPT/OT. No further CM needs.  -updated fl2/d/c summary sent via fax. No further CM needs. -[facility] rep Danielle-aware of no home 02 needed-to pick up their 02 tank set up if @ facility.    Final next level of care: Assisted Living Barriers to Discharge: No Barriers Identified   Patient Goals and CMS Choice Patient states their goals for this hospitalization and ongoing recovery are:: Return back to [facility] CMS Medicare.gov Compare Post Acute Care list provided to:: Patient Represenative (must comment) (Tiny(caregiver)/facility) Choice offered to / list presented to :  (caregiver)  Discharge Placement              Patient chooses bed at: Other - please specify in the comment section below: ([facility]) Patient to be transferred to facility by: Family Name of family member notified: Tiny caregiver Patient and family notified of of transfer: 08/17/21  Discharge Plan and Services   Discharge Planning Services: CM Consult Post Acute Care Choice:  (ALF)                               Social Determinants of Health (SDOH) Interventions     Readmission Risk Interventions     View : No data to display.

## 2021-08-17 NOTE — Discharge Summary (Signed)
Physician Discharge Summary  Melinda Edwards Herber LGX:[id number] DOB: Aug 08, 1926 DOA: 08/12/2021  PCP: Lajean Manes, MD  Admit date: 08/12/2021 Discharge date: 08/17/2021  Admitted From: ALF Disposition: ALF  Recommendations for Outpatient Follow-up:  Follow up with PCP in 1 week with repeat CMP Follow up in ED if symptoms worsen or new appear   Home Health: Home health PT/OT Equipment/Devices: None  Discharge Condition: Stable CODE STATUS: DNR Diet recommendation: Heart healthy  Brief/Interim Summary: 88 y.o. female with medical history significant of Alzheimer's dementia, paroxysmal atrial fibrillation, hypertension, pulmonary fibrosis, bronchiectasis, pulmonary nodule, history of other nonhemorrhagic stroke, mild aortic stenosis was brought to the emergency department because of a fall at her residential facility.  Work-up revealed a transaminitis with hyperbilirubinemia along with possible UTI.  CT of the head was negative for acute intracranial abnormality.  CT cervical spine was negative for traumatic injury.  CT chest now shows new 9 mm right lower lobe pulmonary nodule with follow-up recommended in 6 to 12 months if clinically warranted.  Ultrasound of the gallbladder showed a 1.2 cm polyp with recommendations to follow-up in 6 months.  She also has hepatic cysts.  She was started on IV Rocephin.  GI was consulted.  LFTs downtrending.  GI subsequently signed off.  Urine culture grew Proteus and she has been switched to oral Keflex.  She will be discharged back to ALF today on 1 more day of oral Keflex.  Discharge Diagnoses:   Possible UTI: Present on admission -Urine cultures grew Proteus mirabilis.  Rocephin has been switched to Keflex.  Finish 5-day course of therapy.  Discharge back to ALF today, more days of oral Keflex.   Acute transaminitis -Possibly from ischemic enteritis/shock liver.  Hepatitis panel negative.  LFTs improving.  Outpatient follow-up.  Keep statin on hold.  GI  signed off on 08/14/2021   Fall at residential facility -Imaging negative for acute intracranial abnormity or cervical spine fracture. -Fall precautions.  PT recommends SNF placement versus ALF with HHPT.  As per TOC, patient can be accepted back to ALF today.   Volume depletion -Treated with IV fluids.  Off iv fluids  Bronchiectasis  Acute respiratory failure with hypoxia -Continue bronchodilators as needed.  Improved.  Currently on room air.  Hyperlipidemia -No longer on atorvastatin.  Contraindicated at this time because of acute liver injury  Essential hypertension -Blood pressure currently stable.  Continue diltiazem  Paroxysmal A-fib -Currently rate controlled.  Continue aspirin.  Not on anticoagulation as an outpatient.  Continue Cardizem  Alzheimer's dementia -Continue supportive care.  Continue Remeron.  Discharge Instructions   Allergies as of 08/17/2021       Reactions   Antihistamines, Loratadine-type Other (See Comments)   These cause nose bleeds   Loratadine Other (See Comments)   Caused a nose bleed   Namenda [memantine] Other (See Comments)   Agitation        Medication List     STOP taking these medications    atorvastatin 10 MG tablet Commonly known as: LIPITOR   ondansetron 4 MG tablet Commonly known as: ZOFRAN   potassium chloride SA 20 MEQ tablet Commonly known as: KLOR-CON M       TAKE these medications    aspirin EC 81 MG tablet Take 81 mg by mouth daily. Swallow whole.   b complex vitamins capsule Take 1 capsule by mouth daily.   cephALEXin 500 MG capsule Commonly known as: KEFLEX Take 1 capsule (500 mg total) by mouth every 12 (twelve)  hours for 1 day.   cholecalciferol 25 MCG (1000 UNIT) tablet Commonly known as: VITAMIN D Take 1,000 Units by mouth daily.   diltiazem 120 MG 24 hr capsule Commonly known as: Cardizem CD Take 1 capsule (120 mg total) by mouth daily.   mirtazapine 15 MG tablet Commonly known as:  REMERON Take 15 mg by mouth at bedtime.   polyethylene glycol 17 g packet Commonly known as: MIRALAX / GLYCOLAX Take 17 g by mouth daily as needed for mild constipation.   PROBIOTIC-10 PO Take by mouth.         Follow-up Information     Stoneking, Hal, MD. Schedule an appointment as soon as possible for a visit in 1 week(s).   Specialty: Internal Medicine Why: with CMP Contact information: [address] [place] [address] [phone]                Allergies  Allergen Reactions   Antihistamines, Loratadine-Type Other (See Comments)    These cause nose bleeds   Loratadine Other (See Comments)    Caused a nose bleed   Namenda [Memantine] Other (See Comments)    Agitation    Consultations: GI   Procedures/Studies: CT Head Wo Contrast  Result Date: 08/13/2021 CLINICAL DATA:  Fall, dementia EXAM: CT HEAD WITHOUT CONTRAST CT CERVICAL SPINE WITHOUT CONTRAST TECHNIQUE: Multidetector CT imaging of the head and cervical spine was performed following the standard protocol without intravenous contrast. Multiplanar CT image reconstructions of the cervical spine were also generated. RADIATION DOSE REDUCTION: This exam was performed according to the departmental dose-optimization program which includes automated exposure control, adjustment of the mA and/or kV according to patient size and/or use of iterative reconstruction technique. COMPARISON:  05/20/2021 FINDINGS: CT HEAD FINDINGS Brain: No evidence of acute infarction, hemorrhage, hydrocephalus, extra-axial collection or mass lesion/mass effect. Mild subcortical white matter and periventricular small vessel ischemic changes. Vascular: Intracranial atherosclerosis. Skull: Normal. Negative for fracture or focal lesion. Sinuses/Orbits: The visualized paranasal sinuses are essentially clear. The mastoid air cells are unopacified. Other: None. CT CERVICAL SPINE FINDINGS Alignment: Normal cervical lordosis. Skull  base and vertebrae: No acute fracture. No primary bone lesion or focal pathologic process. Soft tissues and spinal canal: No prevertebral fluid or swelling. No visible canal hematoma. Disc levels: Mild degenerative changes of the mid cervical spine. Spinal canal is patent. Upper chest: Evaluated on dedicated CT chest. Other: None. IMPRESSION: No evidence of acute intracranial abnormality. Mild small vessel ischemic changes. No acute traumatic injury to the cervical spine. Mild degenerative changes. Electronically Signed   By: Julian Hy M.D.   On: 08/13/2021 00:21   CT Cervical Spine Wo Contrast  Result Date: 08/13/2021 CLINICAL DATA:  Fall, dementia EXAM: CT HEAD WITHOUT CONTRAST CT CERVICAL SPINE WITHOUT CONTRAST TECHNIQUE: Multidetector CT imaging of the head and cervical spine was performed following the standard protocol without intravenous contrast. Multiplanar CT image reconstructions of the cervical spine were also generated. RADIATION DOSE REDUCTION: This exam was performed according to the departmental dose-optimization program which includes automated exposure control, adjustment of the mA and/or kV according to patient size and/or use of iterative reconstruction technique. COMPARISON:  05/20/2021 FINDINGS: CT HEAD FINDINGS Brain: No evidence of acute infarction, hemorrhage, hydrocephalus, extra-axial collection or mass lesion/mass effect. Mild subcortical white matter and periventricular small vessel ischemic changes. Vascular: Intracranial atherosclerosis. Skull: Normal. Negative for fracture or focal lesion. Sinuses/Orbits: The visualized paranasal sinuses are essentially clear. The mastoid air cells are unopacified. Other: None.  CT CERVICAL SPINE FINDINGS Alignment: Normal cervical lordosis. Skull base and vertebrae: No acute fracture. No primary bone lesion or focal pathologic process. Soft tissues and spinal canal: No prevertebral fluid or swelling. No visible canal hematoma. Disc levels:  Mild degenerative changes of the mid cervical spine. Spinal canal is patent. Upper chest: Evaluated on dedicated CT chest. Other: None. IMPRESSION: No evidence of acute intracranial abnormality. Mild small vessel ischemic changes. No acute traumatic injury to the cervical spine. Mild degenerative changes. Electronically Signed   By: Julian Hy M.D.   On: 08/13/2021 00:21   CT CHEST ABDOMEN PELVIS W CONTRAST  Result Date: 08/13/2021 CLINICAL DATA:  Fall, dementia EXAM: CT CHEST, ABDOMEN, AND PELVIS WITH CONTRAST TECHNIQUE: Multidetector CT imaging of the chest, abdomen and pelvis was performed following the standard protocol during bolus administration of intravenous contrast. RADIATION DOSE REDUCTION: This exam was performed according to the departmental dose-optimization program which includes automated exposure control, adjustment of the mA and/or kV according to patient size and/or use of iterative reconstruction technique. CONTRAST:  169m OMNIPAQUE IOHEXOL 300 MG/ML  SOLN COMPARISON:  CT abdomen/pelvis dated 03/16/2020. CT chest dated 12/02/2017. FINDINGS: CT CHEST FINDINGS Cardiovascular: Heart is normal in size.  No pericardial effusion. No evidence of thoracic aortic aneurysm. Mild atherosclerotic calcifications the aortic root/arch. Mild three-coronary sclerosis. Mediastinum/Nodes: No suspicious mediastinal lymphadenopathy. Bilateral thyroid nodules, measuring up to 16 mm on the left. Given the patient's age and comorbidities, no follow-up is recommended. Lungs/Pleura: Biapical pleural-parenchymal scarring. Mild centrilobular and paraseptal emphysematous changes, upper lung predominant. 9 mm nodule with adjacent 13 mm thin-walled cyst in the posteromedial right lower lobe (series 6/image 110), new from 2022. Surrounding post infectious/inflammatory scarring in the medial right lower lobe. Scattered areas of bronchiectasis, including in the bilateral upper lobes, medial right middle lobe, and  lingula. No pleural effusion or pneumothorax. Musculoskeletal: Visualized osseous structures are within normal limits. No fracture is seen. CT ABDOMEN PELVIS FINDINGS Hepatobiliary: Left hepatic cysts, measuring up to 19 mm (series 2/image 82), benign. Gallbladder is unremarkable. No intrahepatic or extrahepatic ductal dilatation. Pancreas: Within normal limits. Spleen: Within normal limits. Adrenals/Urinary Tract: Stable 2.4 cm left adrenal nodule (series 2/image 6), compatible with a benign adrenal adenoma. No follow-up is recommended. Right adrenal glands are within normal limits. Subcentimeter interpolar left renal cyst (series 2/image 63). Right kidney is within normal limits. No hydronephrosis. Bladder is mildly thick-walled although underdistended. Stomach/Bowel: Stomach is notable for a tiny hiatal hernia. No evidence of bowel obstruction. Normal appendix (series 2/image 101). Extensive colonic diverticulosis, without evidence of diverticulitis. Vascular/Lymphatic: No evidence of abdominal aortic aneurysm. Atherosclerotic calcifications of the abdominal aorta and branch vessels. Retroaortic left renal vein. No suspicious abdominopelvic lymphadenopathy. Reproductive: Uterus is within normal limits. Bilateral ovaries are within normal limits. Other: No abdominopelvic ascites. Musculoskeletal: Mild superior endplate compression fracture deformity at L2, unchanged. No fracture is seen. IMPRESSION: No evidence of traumatic injury to the chest, abdomen, or pelvis. 9 mm right lower lobe pulmonary nodule, new from 2022. While this may be infectious/inflammatory, a small primary bronchogenic neoplasm is not excluded. Given the patient's age and comorbidities, it is unclear that aggressive follow-up is required. Consider CT chest in 6-12 months if clinically warranted. Additional ancillary findings as above. Electronically Signed   By: SJulian HyM.D.   On: 08/13/2021 00:30   UKoreaAbdomen Limited RUQ  (LIVER/GB)  Result Date: 08/13/2021 CLINICAL DATA:  Elevated LFTs EXAM: ULTRASOUND ABDOMEN LIMITED RIGHT UPPER QUADRANT COMPARISON:  CT chest  abdomen and pelvis 08/13/2021 FINDINGS: Gallbladder: There is a 1.2 x 0.8 by 0.6 cm nonshadowing echogenic area adjacent to the gallbladder wall, non mobile. This has a ball on the wall appearance. This is favored as a polyp. There is no gallbladder wall thickening or pericholecystic fluid. No cholelithiasis. Sonographic Percell Miller sign is negative. Common bile duct: Diameter: 6 mm Liver: Two hepatic cysts are identified in the left lobe of the liver. One measures 9 x 7 x 9 mm in the other measures 2.3 x 1.6 x 2.0 cm. Within normal limits in parenchymal echogenicity. Portal vein is patent on color Doppler imaging with normal direction of blood flow towards the liver. Other: None. IMPRESSION: 1. 1.2 cm gallbladder polyp. Follow-up ultrasound recommended in 6 months. 2. Hepatic cysts. Electronically Signed   By: Ronney Asters M.D.   On: 08/13/2021 01:38      Subjective: Patient seen and examined at bedside.  Confused.  No fever, agitation, vomiting or seizures reported.  Discharge Exam: Vitals:   08/16/21 1952 08/17/21 0340  BP: 130/76 122/74  Pulse: (!) 106 86  Resp: 20 20  Temp: 98.1 F (36.7 C) 97.6 F (36.4 C)  SpO2: 94% 95%    General: Pt is awake, not in acute distress.  Confused.  Currently on room air.  Hardly participates in any conversation.  Extremely slow to respond. Cardiovascular: Intermittent tachycardia present; S1/S2 + Respiratory: bilateral decreased breath sounds at bases Abdominal: Soft, NT, ND, bowel sounds + Extremities: no edema, no cyanosis    The results of significant diagnostics from this hospitalization (including imaging, microbiology, ancillary and laboratory) are listed below for reference.     Microbiology: Recent Results (from the past 240 hour(s))  Urine Culture     Status: Abnormal   Collection Time: 08/13/21  2:45  PM   Specimen: Urine, Clean Catch  Result Value Ref Range Status   Specimen Description   Final    URINE, CLEAN CATCH Performed at [facility], [place] [address] [place], [place] [address]    Special Requests   Final    NONE Performed at [facility], [place] [address] [place], [place] [address]    Culture >=100,000 COLONIES/mL PROTEUS MIRABILIS (A)  Final   Report Status 08/15/2021 FINAL  Final   Organism ID, Bacteria PROTEUS MIRABILIS (A)  Final      Susceptibility   Proteus mirabilis - MIC*    AMPICILLIN <=2 SENSITIVE Sensitive     CEFAZOLIN <=4 SENSITIVE Sensitive     CEFEPIME <=0.12 SENSITIVE Sensitive     CEFTRIAXONE <=0.25 SENSITIVE Sensitive     CIPROFLOXACIN <=0.25 SENSITIVE Sensitive     GENTAMICIN <=1 SENSITIVE Sensitive     IMIPENEM 4 SENSITIVE Sensitive     NITROFURANTOIN 128 RESISTANT Resistant     TRIMETH/SULFA <=20 SENSITIVE Sensitive     AMPICILLIN/SULBACTAM <=2 SENSITIVE Sensitive     PIP/TAZO <=4 SENSITIVE Sensitive     * >=100,000 COLONIES/mL PROTEUS MIRABILIS     Labs: BNP (last 3 results) No results for input(s): BNP in the last 8760 hours. Basic Metabolic Panel: Recent Labs  Lab 08/12/21 2300 08/13/21 0230 08/13/21 0510 08/14/21 0451 08/15/21 0534 08/17/21 0442  NA 141  --  143 141 138 143  K 3.5  --  3.7 3.6 3.4* 3.9  CL 108  --  109 108 106 111  CO2 26  --  '25 26 25 25  '$ GLUCOSE 150*  --  123* 113* 112* 99  BUN 26*  --  '22 17 17 17  '$ CREATININE 0.73  --  0.66 0.56 0.64 0.67  CALCIUM 9.2  --  9.1 8.6* 8.3* 8.5*  MG  --  1.8 2.4  --  1.9 2.3   Liver Function Tests: Recent Labs  Lab 08/12/21 2300 08/13/21 0510 08/14/21 0451 08/15/21 0534 08/17/21 0442  AST 1,027* 722* 203* 66* 29  ALT 1,256* 1,079* 591* 321* 152*  ALKPHOS 190* 180* 149* 125 109  BILITOT 2.0* 1.6* 1.0 0.9 0.7  PROT 7.4 7.1 6.5 6.3* 6.4*  ALBUMIN 3.7 3.5 3.1* 2.8* 2.8*   Recent Labs  Lab 08/12/21 2300  LIPASE 37   No results  for input(s): AMMONIA in the last 168 hours. CBC: Recent Labs  Lab 08/12/21 2300 08/13/21 0510 08/14/21 0451  WBC 8.8 9.7 9.3  NEUTROABS 6.8 7.7  --   HGB 13.3 13.4 13.2  HCT 41.6 42.2 41.7  MCV 86.1 86.5 87.2  PLT 224 221 192   Cardiac Enzymes: No results for input(s): CKTOTAL, CKMB, CKMBINDEX, TROPONINI in the last 168 hours. BNP: Invalid input(s): POCBNP CBG: No results for input(s): GLUCAP in the last 168 hours. D-Dimer No results for input(s): DDIMER in the last 72 hours. Hgb A1c No results for input(s): HGBA1C in the last 72 hours. Lipid Profile No results for input(s): CHOL, HDL, LDLCALC, TRIG, CHOLHDL, LDLDIRECT in the last 72 hours. Thyroid function studies No results for input(s): TSH, T4TOTAL, T3FREE, THYROIDAB in the last 72 hours.  Invalid input(s): FREET3 Anemia work up No results for input(s): VITAMINB12, FOLATE, FERRITIN, TIBC, IRON, RETICCTPCT in the last 72 hours. Urinalysis    Component Value Date/Time   COLORURINE YELLOW 08/13/2021 1445   APPEARANCEUR CLOUDY (A) 08/13/2021 1445   LABSPEC 1.043 (H) 08/13/2021 1445   PHURINE 9.0 (H) 08/13/2021 1445   GLUCOSEU NEGATIVE 08/13/2021 1445   HGBUR NEGATIVE 08/13/2021 1445   BILIRUBINUR NEGATIVE 08/13/2021 1445   KETONESUR NEGATIVE 08/13/2021 1445   PROTEINUR 100 (A) 08/13/2021 1445   NITRITE NEGATIVE 08/13/2021 1445   LEUKOCYTESUR MODERATE (A) 08/13/2021 1445   Sepsis Labs Invalid input(s): PROCALCITONIN,  WBC,  LACTICIDVEN Microbiology Recent Results (from the past 240 hour(s))  Urine Culture     Status: Abnormal   Collection Time: 08/13/21  2:45 PM   Specimen: Urine, Clean Catch  Result Value Ref Range Status   Specimen Description   Final    URINE, CLEAN CATCH Performed at [facility], [place] [address], [place], [place] [address]    Special Requests   Final    NONE Performed at [facility], [place] [address] [place], [place] [address]    Culture  >=100,000 COLONIES/mL PROTEUS MIRABILIS (A)  Final   Report Status 08/15/2021 FINAL  Final   Organism ID, Bacteria PROTEUS MIRABILIS (A)  Final      Susceptibility   Proteus mirabilis - MIC*    AMPICILLIN <=2 SENSITIVE Sensitive     CEFAZOLIN <=4 SENSITIVE Sensitive     CEFEPIME <=0.12 SENSITIVE Sensitive     CEFTRIAXONE <=0.25 SENSITIVE Sensitive     CIPROFLOXACIN <=0.25 SENSITIVE Sensitive     GENTAMICIN <=1 SENSITIVE Sensitive     IMIPENEM 4 SENSITIVE Sensitive     NITROFURANTOIN 128 RESISTANT Resistant     TRIMETH/SULFA <=20 SENSITIVE Sensitive     AMPICILLIN/SULBACTAM <=2 SENSITIVE Sensitive     PIP/TAZO <=4 SENSITIVE Sensitive     * >=100,000 COLONIES/mL PROTEUS MIRABILIS     Time coordinating  discharge: 35 minutes  SIGNED:   Aline August, MD  [facility] 08/17/2021, 8:02 AM

## 2021-08-17 NOTE — Progress Notes (Deleted)
Patient still in hospital at this time. Will reschedule.

## 2021-08-17 NOTE — NC FL2 (Addendum)
  [place] LEVEL OF CARE SCREENING TOOL     IDENTIFICATION  Patient Name: Melinda Edwards Birthdate: Sep 05, 1926 Sex: female Admission Date (Current Location): 08/12/2021  [facility] and [place] Number:  Herbalist and Address:  [facility],  [place] [address] [place]      Provider Number: [id number]  Attending Physician Name and Address:  Aline August, MD  Relative Name and Phone Number:  T    Current Level of Care: Hospital Recommended Level of Care: [place] Prior Approval Number:    Date Approved/Denied:   PASRR Number:    Discharge Plan: Other (Comment) ([facility])    Current Diagnoses:   Orientation RESPIRATION BLADDER Height & Weight     Self  Normal Incontinent Weight: 65.1 kg Height:  5' 4.5" (163.8 cm)  BEHAVIORAL SYMPTOMS/MOOD NEUROLOGICAL BOWEL NUTRITION STATUS      Incontinent Regular  AMBULATORY STATUS COMMUNICATION OF NEEDS Skin   Limited Assist   Normal                       Personal Care Assistance Level of Assistance  Bathing, Feeding, Dressing Bathing Assistance: Limited assistance Feeding assistance: Limited assistance Dressing Assistance: Limited assistance     Functional Limitations Info  Sight, Hearing, Speech Sight Info: Impaired (eyeglasses) Hearing Info: Impaired (Bilateral hearing aids.) Speech Info: Adequate    SPECIAL CARE FACTORS FREQUENCY        PT Frequency:  (2x week) OT Frequency:  (2x week)            Contractures Contractures Info: Not present    Additional Factors Info  Code Status, Allergies Code Status Info:  (DNR) Allergies Info:  (Antihistamines, Loratadine-type, Loratadine, Namenda (Memantine)               Discharge Medications:   aspirin EC 81 MG tablet Take 81 mg by mouth daily. Swallow whole.    b complex vitamins capsule Take 1 capsule by mouth daily.    cephALEXin 500 MG capsule Commonly known as: KEFLEX Take  1 capsule (500 mg total) by mouth every 12 (twelve) hours for 1 day.    cholecalciferol 25 MCG (1000 UNIT) tablet Commonly known as: VITAMIN D Take 1,000 Units by mouth daily.    diltiazem 120 MG 24 hr capsule Commonly known as: Cardizem CD Take 1 capsule (120 mg total) by mouth daily.    mirtazapine 15 MG tablet Commonly known as: REMERON Take 15 mg by mouth at bedtime.    polyethylene glycol 17 g packet Commonly known as: MIRALAX / GLYCOLAX Take 17 g by mouth daily as needed for mild constipation.    PROBIOTIC-10 PO Take by mouth.           Please see discharge summary for a list of discharge medications.  Relevant Imaging Results:  Relevant Lab Results:   Additional Information SSN# [id number]  Dessa Phi, RN

## 2021-08-17 NOTE — Care Management Important Message (Signed)
Important Message  Patient Details IM Letter placed in Patients room. Name: Melinda Edwards MRN: [id number] Date of Birth: March 26, 1926   Medicare Important Message Given:  Yes     Kerin Salen 08/17/2021, 1:19 PM

## 2021-08-18 ENCOUNTER — Emergency Department (HOSPITAL_COMMUNITY)
Admission: EM | Admit: 2021-08-18 | Discharge: 2021-08-19 | Disposition: A | Discharge status: Home / Self Care | Payer: Medicare PPO | Attending: Emergency Medicine | Admitting: Emergency Medicine

## 2021-08-18 ENCOUNTER — Encounter (HOSPITAL_COMMUNITY): Payer: Self-pay

## 2021-08-18 ENCOUNTER — Emergency Department (HOSPITAL_COMMUNITY): Payer: Medicare PPO

## 2021-08-18 ENCOUNTER — Other Ambulatory Visit: Payer: Self-pay

## 2021-08-18 DIAGNOSIS — R410 Disorientation, unspecified: Secondary | ICD-10-CM | POA: Insufficient documentation

## 2021-08-18 DIAGNOSIS — N390 Urinary tract infection, site not specified: Secondary | ICD-10-CM | POA: Diagnosis not present

## 2021-08-18 DIAGNOSIS — B9689 Other specified bacterial agents as the cause of diseases classified elsewhere: Secondary | ICD-10-CM | POA: Insufficient documentation

## 2021-08-18 DIAGNOSIS — R0602 Shortness of breath: Secondary | ICD-10-CM | POA: Insufficient documentation

## 2021-08-18 DIAGNOSIS — R0902 Hypoxemia: Secondary | ICD-10-CM | POA: Insufficient documentation

## 2021-08-18 DIAGNOSIS — Z7982 Long term (current) use of aspirin: Secondary | ICD-10-CM | POA: Diagnosis not present

## 2021-08-18 DIAGNOSIS — R531 Weakness: Secondary | ICD-10-CM | POA: Diagnosis not present

## 2021-08-18 DIAGNOSIS — I700 Atherosclerosis of aorta: Secondary | ICD-10-CM | POA: Diagnosis not present

## 2021-08-18 DIAGNOSIS — I2510 Atherosclerotic heart disease of native coronary artery without angina pectoris: Secondary | ICD-10-CM | POA: Diagnosis not present

## 2021-08-18 LAB — BASIC METABOLIC PANEL
Anion gap: 10 (ref 5–15)
BUN: 22 mg/dL (ref 8–23)
CO2: 21 mmol/L — ABNORMAL LOW (ref 22–32)
Calcium: 8.6 mg/dL — ABNORMAL LOW (ref 8.9–10.3)
Chloride: 108 mmol/L (ref 98–111)
Creatinine, Ser: 0.76 mg/dL (ref 0.44–1.00)
GFR, Estimated: >60 mL/min (ref >60)
Glucose, Bld: 120 mg/dL — ABNORMAL HIGH (ref 70–99)
Potassium: 4 mmol/L (ref 3.5–5.1)
Sodium: 139 mmol/L (ref 135–145)

## 2021-08-18 LAB — BLOOD GAS, VENOUS
Acid-Base Excess: 1.5 mmol/L (ref 0.0–2.0)
Bicarbonate: 26.6 mmol/L (ref 20.0–28.0)
O2 Saturation: 38.6 %
Patient temperature: 37
pCO2, Ven: 43 mmHg — ABNORMAL LOW (ref 44–60)
pH, Ven: 7.4 (ref 7.25–7.43)
pO2, Ven: <31 mmHg — CL (ref 32–45)

## 2021-08-18 LAB — CBC WITH DIFFERENTIAL/PLATELET
Abs Immature Granulocytes: 0.14 10*3/uL — ABNORMAL HIGH (ref 0.00–0.07)
Basophils Absolute: 0.1 10*3/uL (ref 0.0–0.1)
Basophils Relative: 1 %
Eosinophils Absolute: 0.1 10*3/uL (ref 0.0–0.5)
Eosinophils Relative: 1 %
HCT: 41.3 % (ref 36.0–46.0)
Hemoglobin: 13.3 g/dL (ref 12.0–15.0)
Immature Granulocytes: 1 %
Lymphocytes Relative: 12 %
Lymphs Abs: 1.3 10*3/uL (ref 0.7–4.0)
MCH: 27.8 pg (ref 26.0–34.0)
MCHC: 32.2 g/dL (ref 30.0–36.0)
MCV: 86.4 fL (ref 80.0–100.0)
Monocytes Absolute: 0.8 10*3/uL (ref 0.1–1.0)
Monocytes Relative: 7 %
Neutro Abs: 8.4 10*3/uL — ABNORMAL HIGH (ref 1.7–7.7)
Neutrophils Relative %: 78 %
Platelets: 248 10*3/uL (ref 150–400)
RBC: 4.78 MIL/uL (ref 3.87–5.11)
RDW: 13.4 % (ref 11.5–15.5)
WBC: 10.7 10*3/uL — ABNORMAL HIGH (ref 4.0–10.5)
nRBC: 0 % (ref 0.0–0.2)

## 2021-08-18 LAB — URINALYSIS, ROUTINE W REFLEX MICROSCOPIC
Bilirubin Urine: NEGATIVE
Glucose, UA: NEGATIVE mg/dL
Ketones, ur: NEGATIVE mg/dL
Nitrite: NEGATIVE
Protein, ur: NEGATIVE mg/dL
Specific Gravity, Urine: 1.021 (ref 1.005–1.030)
WBC, UA: >50 WBC/hpf — ABNORMAL HIGH (ref 0–5)
pH: 5 (ref 5.0–8.0)

## 2021-08-18 MED ORDER — MIRTAZAPINE 7.5 MG PO TABS
15.0000 mg | ORAL_TABLET | Freq: Every day | ORAL | Status: DC
  Filled 2021-08-18: qty 2

## 2021-08-18 MED ORDER — DILTIAZEM HCL ER COATED BEADS 120 MG PO CP24: 120.0000 mg | ORAL_CAPSULE | Freq: Every day | ORAL | Status: DC

## 2021-08-18 MED ORDER — SODIUM CHLORIDE 0.9 % IV SOLN
2.0000 g | Freq: Once | INTRAVENOUS | Status: AC
  Administered 2021-08-18: 2 g via INTRAVENOUS
  Filled 2021-08-18: qty 20

## 2021-08-18 NOTE — ED Notes (Signed)
SATURATION QUALIFICATIONS: (This note is used to comply with regulatory documentation for home oxygen)  Patient Saturations on Room Air at Rest = 91%  Patient Saturations on Room Air while Ambulating = 89%  Patient Saturations on 2 Liters of oxygen while Ambulating = 95%  Please briefly explain why patient needs home oxygen:

## 2021-08-18 NOTE — Progress Notes (Signed)
Transition of Care [facility]) - Emergency Department Mini Assessment   Patient Details  Name: Melinda Edwards MRN: [id number] Date of Birth: Oct 22, 1926  Transition of Care [facility]) CM/SW Contact:    Roseanne Kaufman, RN Phone Number: 08/18/2021, 6:17 PM   Clinical Narrative: Patient presented to [facility] ED via EMS from ALF [facility]rius Charity at [facility] reports patient has oxygen sats in the 80's and increased confusion. Patient was discharged on yesterday 08/17/21 for treatment of UTI. RNCM received TOC consult for HHC/DME/ home oxygen.    ED Mini Assessment:  RNCM spoke with Zack with [place] regarding home oxygen who reports patient is not currently being serviced by [employer], however [employer] can assist. RNCM notified RN and EDP, due to being after hours oxygen delivery may take longer if patient is discharged from ED today. Labs have been order by EDP. RNCM will continue to monitor.     Patient Contact and Communications  [name] ([place]) [phone]  Tiny (caregiver) [phone]        Admission diagnosis:  AMS,Hypoxia Patient Active Problem List   Diagnosis Date Noted   Acute respiratory failure with hypoxia ([place]) 08/14/2021   UTI (urinary tract infection) 08/14/2021   Transaminitis 08/13/2021   Constipation 08/13/2021   Acute UTI (urinary tract infection) 08/13/2021   Volume depletion 08/13/2021   Hemoglobin decreased 03/17/2020   Lumbar L2 compression fracture, with routine healing, subsequent encounter 03/17/2020   Alzheimer's dementia ([place])    GI bleed 03/16/2020   Paroxysmal atrial fibrillation ([place]) 05/14/2019   CVA (cerebral vascular accident) ([place]) 05/10/2019   Essential hypertension 05/10/2019   Aortic stenosis, mild 04/13/2011   Benign hypertensive heart disease without heart failure 08/13/2010   Hyperlipidemia 08/13/2010   Bronchiectasis without acute exacerbation ([place]) 11/15/2007   PCP:  Lajean Manes, MD Pharmacy:   [facility] - [place], [place] - [address] [place] [address] Phone: [phone] Fax: [place], [address] [place] [address] Phone: [phone] Fax: [phone]

## 2021-08-18 NOTE — Discharge Instructions (Signed)
Continue taking the cephalexin, for another week.  Drink plenty of fluids.  Home health will evaluate and treat your at home with initiation of oxygen therapy, starting on 08/19/2021. CT scan of chest shows no blood clot but does show nodule in the base of the right lung. This should be reassessed in 6-12 months too ensure that it is not changing in size. Return to the ED with new or worsening symptoms.

## 2021-08-18 NOTE — ED Triage Notes (Addendum)
Pt coming from [facility] via EMS. Facility called out because patient has had low oxygen saturations, reportedly in the 80s. Pt also has increased confusion today, patient's caretaker stated that she is acting abnormal. Denies any falls. Pt returned back to her care facility from the hospital yesterday. Patient has no complaints at this time. Currently being treated for a UTI.  Hx alzheimer's, dementia.

## 2021-08-18 NOTE — ED Notes (Addendum)
Patient's POA, [name], contacted and is aware that patient is here.

## 2021-08-18 NOTE — ED Provider Notes (Signed)
[facility] DEPT Provider Note   CSN: [id number] Arrival date & time: 08/18/21  1648     History  Chief Complaint  Patient presents with   Hypoxia   Altered Mental Status    Melinda Edwards is a 88 y.o. female.  HPI Patient presents for evaluation of weakness, confusion and shortness of breath.  These problems are progressive and persistent since she was discharged from the hospital yesterday.  She is here with her caregiver who gives the history.  She was transferred by EMS, who apparently found her with low oxygen saturation, on room air, "in the 80s."  Patient was hospitalized on 08/12/2021 and discharged yesterday.  She was treated for UTI, and diagnosed with transaminitis.  She apparently had a fall prior to arrival.  She has known bronchiectasis and was also treated for acute respiratory failure with hypoxia during the hospitalization.  She was weaned to room air.   Home Medications Prior to Admission medications   Medication Sig Start Date End Date Taking? Authorizing Provider  aspirin EC 81 MG tablet Take 81 mg by mouth daily. Swallow whole.    [provider]  b complex vitamins capsule Take 1 capsule by mouth daily.    [provider]  cephALEXin (KEFLEX) 500 MG capsule Take 1 capsule (500 mg total) by mouth every 12 (twelve) hours for 1 day. 08/17/21 08/18/21  Aline August, MD  cholecalciferol (VITAMIN D) 25 MCG (1000 UNIT) tablet Take 1,000 Units by mouth daily.    [provider]  diltiazem (CARDIZEM CD) 120 MG 24 hr capsule Take 1 capsule (120 mg total) by mouth daily. 05/14/19 08/12/21  Charlynne Cousins, MD  mirtazapine (REMERON) 15 MG tablet Take 15 mg by mouth at bedtime.    [provider]  polyethylene glycol (MIRALAX / GLYCOLAX) 17 g packet Take 17 g by mouth daily as needed for mild constipation. Patient not taking: Reported on 08/12/2021 03/21/20   Alcus Dad, MD  Probiotic Product (PROBIOTIC-10  PO) Take by mouth.    [provider]      Allergies    Antihistamines, loratadine-type; Loratadine; and Namenda [memantine]    Review of Systems   Review of Systems  Physical Exam Updated Vital Signs BP (!) 115/57   Pulse 80   Temp 98.8 F (37.1 C) (Oral)   Resp (!) 25   Ht 5' 4.5" (1.638 m)   Wt 59 kg   SpO2 94%   BMI 21.97 kg/m  Physical Exam Vitals and nursing note reviewed.  Constitutional:      General: She is not in acute distress.    Appearance: She is well-developed. She is not ill-appearing, toxic-appearing or diaphoretic.  HENT:     Head: Normocephalic and atraumatic.     Right Ear: External ear normal.     Left Ear: External ear normal.     Nose: No congestion or rhinorrhea.     Mouth/Throat:     Pharynx: No oropharyngeal exudate or posterior oropharyngeal erythema.  Eyes:     Conjunctiva/sclera: Conjunctivae normal.     Pupils: Pupils are equal, round, and reactive to light.  Neck:     Trachea: Phonation normal.  Cardiovascular:     Rate and Rhythm: Normal rate and regular rhythm.     Heart sounds: Normal heart sounds.  Pulmonary:     Effort: Pulmonary effort is normal.     Breath sounds: Normal breath sounds.  Abdominal:     General:  There is no distension.     Palpations: Abdomen is soft.     Tenderness: There is no abdominal tenderness.  Musculoskeletal:        General: Normal range of motion.     Cervical back: Normal range of motion and neck supple.  Skin:    General: Skin is warm and dry.  Neurological:     Mental Status: She is alert and oriented to person, place, and time.     Cranial Nerves: No cranial nerve deficit.     Sensory: No sensory deficit.     Motor: No abnormal muscle tone.     Coordination: Coordination normal.  Psychiatric:        Mood and Affect: Mood normal.        Behavior: Behavior normal.        Thought Content: Thought content normal.        Judgment: Judgment normal.    ED Results / Procedures /  Treatments   Labs (all labs ordered are listed, but only abnormal results are displayed) Labs Reviewed  BASIC METABOLIC PANEL - Abnormal; Notable for the following components:      Result Value   CO2 21 (*)    Glucose, Bld 120 (*)    Calcium 8.6 (*)    All other components within normal limits  CBC WITH DIFFERENTIAL/PLATELET - Abnormal; Notable for the following components:   WBC 10.7 (*)    Neutro Abs 8.4 (*)    Abs Immature Granulocytes 0.14 (*)    All other components within normal limits  URINALYSIS, ROUTINE W REFLEX MICROSCOPIC - Abnormal; Notable for the following components:   APPearance CLOUDY (*)    Hgb urine dipstick SMALL (*)    Leukocytes,Ua LARGE (*)    WBC, UA >50 (*)    Bacteria, UA RARE (*)    All other components within normal limits  BLOOD GAS, VENOUS - Abnormal; Notable for the following components:   pCO2, Ven 43 (*)    pO2, Ven <31 (*)    All other components within normal limits  URINE CULTURE    EKG None  Radiology DG Chest Port 1 View  Result Date: 08/18/2021 CLINICAL DATA:  Hypoxia, increased confusion. EXAM: PORTABLE CHEST 1 VIEW COMPARISON:  Chest CT 08/13/2021, 5 days ago. Chest radiograph 03/16/2020 FINDINGS: Lung volumes are low. Biapical pleuroparenchymal scarring. The heart is normal in size. Unchanged mediastinal contours. Minimal patchy right infrahilar airspace disease, the right lower lobe nodule on prior CT is not seen. Bronchial thickening. No large pleural effusion. No pneumothorax. No pulmonary edema. IMPRESSION: 1. Low lung volumes with minimal patchy right infrahilar airspace disease, atelectasis versus pneumonia. 2. Bronchial thickening. Electronically Signed   By: Keith Rake M.D.   On: 08/18/2021 18:44    Procedures Procedures    Medications Ordered in ED Medications  diltiazem (CARDIZEM CD) 24 hr capsule 120 mg (has no administration in time range)  mirtazapine (REMERON) tablet 15 mg (has no administration in time range)   cefTRIAXone (ROCEPHIN) 2 g in sodium chloride 0.9 % 100 mL IVPB (0 g Intravenous Stopped 08/18/21 2139)    ED Course/ Medical Decision Making/ A&P                           Medical Decision Making Patient presenting for evaluation of weakness, confusion and shortness of breath.  Symptoms have worsened since she was discharged from the hospital yesterday.  Amount and/or Complexity  of Data Reviewed Independent Historian: caregiver and EMS    Details: Caregiver at bedside gives history.  Hypoxia in the field per EMS External Data Reviewed: labs, radiology, ECG and notes.    Details: Hospitalized and discharged yesterday, per discharge notes, she was treated for UTI and had transaminitis.  She had hypoxic respiratory failure that improved with normalization of her oxygenation on room air.  They attempted to walk her to see if she desaturated but apparently did not desaturate so discharge her on room air oxygen.  At home today EMS found her to be hypoxic. Labs: ordered.    Details: CBC, metabolic panel, venous blood gas, urinalysis, urine culture-normal except urinalysis consistent with infection, urine culture pending,Calcium low CO2 low, glucose high,, White count slightly elevated c with left shift Radiology: ordered and independent interpretation performed.    Details: Chest x-ray-no edema or infiltrate ECG/medicine tests: ordered and independent interpretation performed.    Details: Ambulatory oxygen on room air, hypoxic to 85% after walking about 200 feet.  She is able to ambulate with normal oxygenation when using oxygen per nasal cannula at 2 L.  Is been documented by nursing. Discussion of management or test interpretation with external provider(s): Consultation TOC to arrange for home oxygen  Risk Prescription drug management. Decision regarding hospitalization. Risk Details: Patient presenting with nonspecific symptoms found to be hypoxic with ambulation.  During recent hospitalization  she had respiratory failure with hypoxia.  Currently no evidence for pneumonia.  Possible persistent or new urinary tract infection, urine culture pending.  She was treated with a dose of Rocephin.  Anticipate discharge additional course of Keflex for a week.  Unable to obtain oxygen at home, on evening shift 08/18/2021.  Patient will therefore require overnight stay in the ED until oxygen can be obtained at home, tomorrow morning.  I anticipate that she will use the oxygen 24 hours a day to start then.  With ambulation and at night if needed.  Have ordered home health services as well to help supervise this treatment and arrange for further management as needed.  She does not require hospitalization at this time.  Doubt severe sepsis or significant metabolic instability.  No evidence for pneumonia or heart failure at this time.  Hypoxia that improves with nasal cannula oxygen.  She will likely need ongoing management by her PCP for these problems.  Care to oncoming team to supervise management until she is discharged tomorrow morning.  Critical Care Total time providing critical care: 45 minutes          Final Clinical Impression(s) / ED Diagnoses Final diagnoses:  Hypoxia  Urinary tract infection without hematuria, site unspecified    Rx / DC Orders ED Discharge Orders     None         Daleen Bo, MD 08/18/21 2230

## 2021-08-18 NOTE — ED Notes (Addendum)
Patient was ambulated with walker in the hallway, her O2 dropped down to 85% after coughing twice, O2 sats then were sustained inbetween 85-90% the rest of her walk back to her room (about 45 seconds-1 minute).

## 2021-08-18 NOTE — ED Notes (Signed)
SATURATION QUALIFICATIONS: (This note is used to comply with regulatory documentation for home oxygen)  Patient Saturations on Room Air at Rest = 91%  Patient Saturations on Room Air while Ambulating = 85%  Patient Saturations on  Liters of oxygen while Ambulating = %  Please briefly explain why patient needs home oxygen:Pt was not ambulated while on oxygen. Per prior RN, pt ambulated with walker in hallway. Oxygen dropped to 85 % after coughing twice. Pt's oxygen sats then sustained between 85-90% the rest of her walk back to her room (45 seconds-1 minute).

## 2021-08-18 NOTE — Progress Notes (Addendum)
This RNCM spoke with patient's POA [name] [phone] to advise of TOC cosult for home oxygen. POA is agreeable to [facility] providing home oxygen.POA [name] is also requesting a call from the RN once it is determined patient's status.  This RNCM notified RN Almyra Free of need for walking sats note, home oxygen order and to contact POA.   RNCM notified Zack with [employer] regarding referral for home oxygen.   TOC will continue to follow

## 2021-08-18 NOTE — Discharge Planning (Addendum)
RNCM spoke with Zack with [employer] who can deliver the oxygen to the bedside tonight before 12 midnight once home oxygen orders and walking sats notes are in. EDP and RN notified.  This RNCM spoke with Tiny (patient's caregiver) who advised she is the patient's transportation and will need to be contacted of patient's status. Tiny provided DON Varney Biles) with [facility] phone number [phone]. This RNCM spoke with Varney Biles who advised if patient can get oxygen tonight there's no nurse there to receive patient. Once patient is discharged tomorrow [facility] (ALF) will need oxygen order and AVS. No additional FL2 is needed. RNCM notified EDP, RN and Zack with [employer].  RNCM spoke with [name] [facility]) to advise of patient's disposition for discharge tomorrow am. [name] questioning time of am discharge, this RNCM advised unable to advise of a time.    TOC will continue to follow.   Note: TOC please contact Tiny and [name], upon am discharge. ALF is expecting patient and Tiny has to transport patient to ALF. Tiny will be in the ED tomorrow at 8am.

## 2021-08-19 ENCOUNTER — Emergency Department (HOSPITAL_COMMUNITY): Payer: Medicare PPO

## 2021-08-19 DIAGNOSIS — I639 Cerebral infarction, unspecified: Secondary | ICD-10-CM | POA: Diagnosis not present

## 2021-08-19 DIAGNOSIS — J9601 Acute respiratory failure with hypoxia: Secondary | ICD-10-CM | POA: Diagnosis not present

## 2021-08-19 DIAGNOSIS — I2510 Atherosclerotic heart disease of native coronary artery without angina pectoris: Secondary | ICD-10-CM | POA: Diagnosis not present

## 2021-08-19 DIAGNOSIS — I700 Atherosclerosis of aorta: Secondary | ICD-10-CM | POA: Diagnosis not present

## 2021-08-19 LAB — URINE CULTURE: Culture: NO GROWTH

## 2021-08-19 LAB — HEPATIC FUNCTION PANEL
ALT: 80 U/L — ABNORMAL HIGH (ref 0–44)
AST: 48 U/L — ABNORMAL HIGH (ref 15–41)
Albumin: 2.5 g/dL — ABNORMAL LOW (ref 3.5–5.0)
Alkaline Phosphatase: 90 U/L (ref 38–126)
Bilirubin, Direct: 0.5 mg/dL — ABNORMAL HIGH (ref 0.0–0.2)
Indirect Bilirubin: 1.1 mg/dL — ABNORMAL HIGH (ref 0.3–0.9)
Total Bilirubin: 1.6 mg/dL — ABNORMAL HIGH (ref 0.3–1.2)
Total Protein: 6 g/dL — ABNORMAL LOW (ref 6.5–8.1)

## 2021-08-19 LAB — AMMONIA: Ammonia: 51 umol/L — ABNORMAL HIGH (ref 9–35)

## 2021-08-19 LAB — LIPASE, BLOOD: Lipase: 25 U/L (ref 11–51)

## 2021-08-19 MED ORDER — CEPHALEXIN 500 MG PO CAPS: 500.0000 mg | ORAL_CAPSULE | Freq: Three times a day (TID) | ORAL | 0 refills | Status: DC

## 2021-08-19 MED ORDER — SODIUM CHLORIDE (PF) 0.9 % IJ SOLN
INTRAMUSCULAR | Status: AC
  Filled 2021-08-19: qty 50

## 2021-08-19 MED ORDER — IOHEXOL 350 MG/ML SOLN
100.0000 mL | Freq: Once | INTRAVENOUS | Status: AC | PRN
  Administered 2021-08-19: 62 mL via INTRAVENOUS

## 2021-08-19 NOTE — ED Notes (Signed)
Pt resting, respirations equal & unlabored. 

## 2021-08-19 NOTE — ED Provider Notes (Signed)
Patient holding overnight in the ED for home oxygen to be set up at her facility.  She has no complaints this morning and denies shortness of breath.  Vitals are stable on her nasal cannula.  CTA was performed to evaluate for pulmonary embolism given her recent hospitalization.  Results reviewed and interpreted by me.  There is no pulmonary embolism but there is a large right lower lobe nodule which appears to be chronic and already known  Labs show LFTs are improving.  Ammonia minimally elevated at 51.  Patient is alert and oriented x2 per her baseline per caregiver Tiny at bedside.  Case management indicates oxygen is set up at her facility.  Discussed with patient's caregiver at bedside as well as her power of attorney over the phone. Continue antibiotics for likely UTI will culture is pending.  They are aware of repeat CT scan needed in 6 to 12 months to assess stability of nodule.   Ezequiel Essex, MD 08/19/21 1407

## 2021-08-19 NOTE — Progress Notes (Signed)
CSW spoke with [place] with [employer] to confirm pt's order for oxygen. Pt O2 will be delivered this morning prior to d/c.     Arlie Solomons.Charlei Ramsaran, MSW, [place]  Transitions of Care Clinical Social Worker I Direct Dial: [phone]  Fax: [phone] [email or web address]

## 2021-08-19 NOTE — ED Notes (Signed)
Placed pt on 2 L/M nasal cannula due to pt's SpO2 RA read 84-89%, pt now reading SpO2 96-99%

## 2021-08-19 NOTE — ED Notes (Signed)
Pt was provided perineal care, new brief applied, new bed pad applied, repositioned to comfort.

## 2021-08-19 NOTE — ED Notes (Signed)
Writer attempted to place iv for CT angio study, pt refused.

## 2021-08-21 DIAGNOSIS — G301 Alzheimer's disease with late onset: Secondary | ICD-10-CM | POA: Diagnosis not present

## 2021-08-21 DIAGNOSIS — R748 Abnormal levels of other serum enzymes: Secondary | ICD-10-CM | POA: Diagnosis not present

## 2021-08-21 DIAGNOSIS — I480 Paroxysmal atrial fibrillation: Secondary | ICD-10-CM | POA: Diagnosis not present

## 2021-08-21 DIAGNOSIS — F0280 Dementia in other diseases classified elsewhere without behavioral disturbance: Secondary | ICD-10-CM | POA: Diagnosis not present

## 2021-08-21 DIAGNOSIS — J8410 Pulmonary fibrosis, unspecified: Secondary | ICD-10-CM | POA: Diagnosis not present

## 2021-08-21 DIAGNOSIS — J9601 Acute respiratory failure with hypoxia: Secondary | ICD-10-CM | POA: Diagnosis not present

## 2021-08-21 DIAGNOSIS — I700 Atherosclerosis of aorta: Secondary | ICD-10-CM | POA: Diagnosis not present

## 2021-08-21 DIAGNOSIS — R911 Solitary pulmonary nodule: Secondary | ICD-10-CM | POA: Diagnosis not present

## 2021-08-21 DIAGNOSIS — J449 Chronic obstructive pulmonary disease, unspecified: Secondary | ICD-10-CM | POA: Diagnosis not present

## 2021-09-18 ENCOUNTER — Encounter (HOSPITAL_BASED_OUTPATIENT_CLINIC_OR_DEPARTMENT_OTHER): Payer: Self-pay | Admitting: Nurse Practitioner

## 2021-09-18 ENCOUNTER — Other Ambulatory Visit: Payer: Self-pay | Admitting: Adult Health

## 2021-09-18 ENCOUNTER — Ambulatory Visit (INDEPENDENT_AMBULATORY_CARE_PROVIDER_SITE_OTHER): Payer: Medicare PPO | Admitting: Nurse Practitioner

## 2021-09-18 VITALS — BP 115/68 | HR 77 | Ht 65.0 in | Wt 132.7 lb

## 2021-09-18 DIAGNOSIS — I10 Essential (primary) hypertension: Secondary | ICD-10-CM | POA: Diagnosis not present

## 2021-09-18 DIAGNOSIS — I63312 Cerebral infarction due to thrombosis of left middle cerebral artery: Secondary | ICD-10-CM | POA: Diagnosis not present

## 2021-09-18 DIAGNOSIS — F0280 Dementia in other diseases classified elsewhere without behavioral disturbance: Secondary | ICD-10-CM | POA: Diagnosis not present

## 2021-09-18 DIAGNOSIS — G309 Alzheimer's disease, unspecified: Secondary | ICD-10-CM

## 2021-09-18 DIAGNOSIS — I480 Paroxysmal atrial fibrillation: Secondary | ICD-10-CM

## 2021-09-18 DIAGNOSIS — I6930 Unspecified sequelae of cerebral infarction: Secondary | ICD-10-CM

## 2021-09-18 DIAGNOSIS — E785 Hyperlipidemia, unspecified: Secondary | ICD-10-CM

## 2021-09-18 DIAGNOSIS — R7401 Elevation of levels of liver transaminase levels: Secondary | ICD-10-CM

## 2021-09-18 NOTE — Patient Instructions (Signed)
Thank you for choosing [facility] for your Primary Care needs. I am excited for the opportunity to partner with you to meet your health care goals. It was a pleasure meeting you today!  Recommendations from today's visit: I will check a few labs today to make sure everything has normalized since the hospitalization. I will be in touch with Ms. Fraser Din if there are any concerns.  If you have any concerns, please let me know.   Information on diet, exercise, and health maintenance recommendations are listed below. This is information to help you be sure you are on track for optimal health and monitoring.   Please look over this and let us know if you have any questions or if you have completed any of the health maintenance outside of [place] so that we can be sure your records are up to date.  ___________________________________________________________ About Me: I am an Adult-Geriatric Nurse Practitioner with a background in caring for patients for more than 20 years with a strong intensive care background. I provide primary care and sports medicine services to patients age 42 and older within this office. My education had a strong focus on caring for the older adult population, which I am passionate about. I am also the director of the APP Fellowship with [facility].   My desire is to provide you with the best service through preventive medicine and supportive care. I consider you a part of the medical team and value your input. I work diligently to ensure that you are heard and your needs are met in a safe and effective manner. I want you to feel comfortable with me as your provider and want you to know that your health concerns are important to me.  For your information, our office hours are: Monday, Tuesday, and Thursday 8:00 AM - 5:00 PM Wednesday and Friday 8:00 AM - 12:00 PM.   In my time away from the office I am teaching new APP's within the system and  am unavailable, but my partner, Dr. Burnard Bunting is in the office for emergent needs.   If you have questions or concerns, please call our office at [phone] or send [place] a MyChart message and we will respond as quickly as possible.  ____________________________________________________________ MyChart:  For all urgent or time sensitive needs we ask that you please call the office to avoid delays. Our number is [phone]. MyChart is not constantly monitored and due to the large volume of messages a day, replies may take up to 72 business hours.  MyChart Policy: MyChart allows for you to see your visit notes, after visit summary, provider recommendations, lab and tests results, make an appointment, request refills, and contact your provider or the office for non-urgent questions or concerns. Providers are seeing patients during normal business hours and do not have built in time to review MyChart messages.  We ask that you allow a minimum of 3 business days for responses to [employer]. For this reason, please do not send urgent requests through [place]. Please call the office at [phone]. New and ongoing conditions may require a visit. We have virtual and in person visit available for your convenience.  Complex MyChart concerns may require a visit. Your provider may request you schedule a virtual or in person visit to ensure we are providing the best care possible. MyChart messages sent after 11:00 AM on Friday will not be received by the provider until Monday morning.    Lab  and Test Results: You will receive your lab and test results on MyChart as soon as they are completed and results have been sent by the lab or testing facility. Due to this service, you will receive your results BEFORE your provider.  I review lab and tests results each morning prior to seeing patients. Some results require collaboration with other providers to ensure you are receiving the most appropriate care. For  this reason, we ask that you please allow a minimum of 3-5 business days from the time the ALL results have been received for your provider to receive and review lab and test results and contact you about these.  Most lab and test result comments from the provider will be sent through [place]. Your provider may recommend changes to the plan of care, follow-up visits, repeat testing, ask questions, or request an office visit to discuss these results. You may reply directly to this message or call the office at [phone] to provide information for the provider or set up an appointment. In some instances, you will be called with test results and recommendations. Please let us know if this is preferred and we will make note of this in your chart to provide this for you.    If you have not heard a response to your lab or test results in 5 business days from all results returning to [place], please call the office to let us know. We ask that you please avoid calling prior to this time unless there is an emergent concern. Due to high call volumes, this can delay the resulting process.  After Hours: For all non-emergency after hours needs, please call the office at [phone] and select the option to reach the on-call provider service. On-call services are shared between multiple [place] offices and therefore it will not be possible to speak directly with your provider. On-call providers may provide medical advice and recommendations, but are unable to provide refills for maintenance medications.  For all emergency or urgent medical needs after normal business hours, we recommend that you seek care at the closest Urgent Care or Emergency Department to ensure appropriate treatment in a timely manner.  [facility] has a 24 hour emergency room located on the ground floor for your convenience.   Urgent Concerns During the Business Day Providers are seeing patients from 8AM to [place] with  a busy schedule and are most often not able to respond to non-urgent calls until the end of the day or the next business day. If you should have URGENT concerns during the day, please call and speak to the nurse or schedule a same day appointment so that we can address your concern without delay.   Thank you, again, for choosing me as your health care partner. I appreciate your trust and look forward to learning more about you.   Worthy Keeler, DNP, AGNP-c ___________________________________________________________  Health Maintenance Recommendations Screening Testing Mammogram Every 1 -2 years based on history and risk factors Starting at age 63 Pap Smear Ages 21-39 every 3 years Ages 54-65 every 5 years with HPV testing More frequent testing may be required based on results and history Colon Cancer Screening Every 1-10 years based on test performed, risk factors, and history Starting at age 26 Bone Density Screening Every 2-10 years based on history Starting at age 11 for women Recommendations for men differ based on medication usage, history, and risk factors AAA Screening One time ultrasound Men 64-38 years old who have every  smoked Lung Cancer Screening Low Dose Lung CT every 12 months Age 86-80 years with a 30 pack-year smoking history who still smoke or who have quit within the last 15 years  Screening Labs Routine  Labs: Complete Blood Count (CBC), Complete Metabolic Panel (CMP), Cholesterol (Lipid Panel) Every 6-12 months based on history and medications May be recommended more frequently based on current conditions or previous results Hemoglobin A1c Lab Every 3-12 months based on history and previous results Starting at age 35 or earlier with diagnosis of diabetes, high cholesterol, BMI >26, and/or risk factors Frequent monitoring for patients with diabetes to ensure blood sugar control Thyroid Panel (TSH w/ T3 & T4) Every 6 months based on history, symptoms, and  risk factors May be repeated more often if on medication HIV One time testing for all patients 62 and older May be repeated more frequently for patients with increased risk factors or exposure Hepatitis C One time testing for all patients 39 and older May be repeated more frequently for patients with increased risk factors or exposure Gonorrhea, Chlamydia Every 12 months for all sexually active persons 13-24 years Additional monitoring may be recommended for those who are considered high risk or who have symptoms PSA Men 25-5 years old with risk factors Additional screening may be recommended from age 71-69 based on risk factors, symptoms, and history  Vaccine Recommendations Tetanus Booster All adults every 10 years Flu Vaccine All patients 6 months and older every year COVID Vaccine All patients 12 years and older Initial dosing with booster May recommend additional booster based on age and health history HPV Vaccine 2 doses all patients age 92-26 Dosing may be considered for patients over 26 Shingles Vaccine (Shingrix) 2 doses all adults 39 years and older Pneumonia (Pneumovax 23) All adults 9 years and older May recommend earlier dosing based on health history Pneumonia (Prevnar 50) All adults 32 years and older Dosed 1 year after Pneumovax 23  Additional Screening, Testing, and Vaccinations may be recommended on an individualized basis based on family history, health history, risk factors, and/or exposure.  __________________________________________________________  Diet Recommendations for All Patients  I recommend that all patients maintain a diet low in saturated fats, carbohydrates, and cholesterol. While this can be challenging at first, it is not impossible and small changes can make big differences.  Things to try: Decreasing the amount of soda, sweet tea, and/or juice to one or less per day and replace with water While water is always the first choice, if you  do not like water you may consider adding a water additive without sugar to improve the taste other sugar free drinks Replace potatoes with a brightly colored vegetable at dinner Use healthy oils, such as canola oil or olive oil, instead of butter or hard margarine Limit your bread intake to two pieces or less a day Replace regular pasta with low carb pasta options Bake, broil, or grill foods instead of frying Monitor portion sizes  Eat smaller, more frequent meals throughout the day instead of large meals  An important thing to remember is, if you love foods that are not great for your health, you don't have to give them up completely. Instead, allow these foods to be a reward when you have done well. Allowing yourself to still have special treats every once in a while is a nice way to tell yourself thank you for working hard to keep yourself healthy.   Also remember that every day is a new day. If  you have a bad day and "fall off the wagon", you can still climb right back up and keep moving along on your journey!  We have resources available to help you!  Some websites that may be helpful include: www.http://carter.biz/  Www.VeryWellFit.com _____________________________________________________________  Activity Recommendations for All Patients  I recommend that all adults get at least 20 minutes of moderate physical activity that elevates your heart rate at least 5 days out of the week.  Some examples include: Walking or jogging at a pace that allows you to carry on a conversation Cycling (stationary bike or outdoors) Water aerobics Yoga Weight lifting Dancing If physical limitations prevent you from putting stress on your joints, exercise in a pool or seated in a chair are excellent options.  Do determine your MAXIMUM heart rate for activity: YOUR AGE - 220 = MAX HeartRate   Remember! Do not push yourself too hard.  Start slowly and build up your pace, speed, weight, time in  exercise, etc.  Allow your body to rest between exercise and get good sleep. You will need more water than normal when you are exerting yourself. Do not wait until you are thirsty to drink. Drink with a purpose of getting in at least 8, 8 ounce glasses of water a day plus more depending on how much you exercise and sweat.    If you begin to develop dizziness, chest pain, abdominal pain, jaw pain, shortness of breath, headache, vision changes, lightheadedness, or other concerning symptoms, stop the activity and allow your body to rest. If your symptoms are severe, seek emergency evaluation immediately. If your symptoms are concerning, but not severe, please let us know so that we can recommend further evaluation.

## 2021-09-18 NOTE — Progress Notes (Signed)
Orma Render, DNP, AGNP-c Primary Care & Sports Medicine [address]  [place], [place] [address] [phone]  New patient visit   Patient: Melinda Edwards   DOB: [birth year]   88 y.o. Female  MRN: [id number] Visit Date: 09/18/2021  Patient Care Team: Harol Shabazz, Coralee Pesa, NP as PCP - General (Nurse Practitioner) Lajean Manes, MD (Internal Medicine)  Today's Vitals   09/18/21 1046  BP: 115/68  Pulse: 77  SpO2: 97%  Weight: 132 lb 11.2 oz (60.2 kg)  Height: _0  (1.651 m)   Body mass index is 22.08 kg/m.   Today's healthcare provider: Orma Render, NP   Chief Complaint  Patient presents with   New Patient (Initial Visit)    Patient presents today establish care. No further to discuss   Subjective    Melinda Edwards is a 88 y.o. female who presents today as a new patient to establish care.    Patient endorses the following concerns presently: Melinda Edwards is here today with her daytime caregiver and her power of attorney [name].  They all tell me that her appetite is good with things that she likes to eat.  She currently resides in an independent living facility but does have around-the-clock care to help with her ADLs and management of her medications.  She does suffer from memory loss which is limiting to her however she is surrounded good friends and caregivers. She has a history of a thrombotic stroke with some residual weakness.  She also has a history of hyperlipidemia and hypertension.  History reviewed and reveals the following: Past Medical History:  Diagnosis Date   Alzheimer's dementia ([place])    Aortic stenosis, mild 04/13/2011   Her last echocardiogram was 01/11/06 and showed normal ejection fraction of 55-60% and mild aortic stenosis with mild aortic insufficiency.  Her last stress test was 12/29/95 and was a normal stress thallium treadmill test.   Atrial fibrillation ([place]) 05/14/2019   Bronchiectasis without acute exacerbation ([place])  11/15/2007   CT chest 2009:  RML and lingular bronchiectasis.  Nodular changes suspicious of MAC colonization AFB culture 2009: negative   Hyperlipidemia    Hypertension    PAC (premature atrial contraction)    BENIGH   Palpitations    Pulmonary fibrosis (H[facility])    Pulmonary nodule    Stroke ([place]) 05/10/2019   Stroke ([place]) 06/16/2019   Past Surgical History:  Procedure Laterality Date   BREAST EXCISIONAL BIOPSY Right 2003   DILATION AND CURETTAGE OF UTERUS     Family Status  Relation Name Status   Other  (Not Specified)   Brother  Deceased   Mother  Deceased   Father  Deceased   Sister Inez Catalina Alive   Family History  Problem Relation Age of Onset   Heart disease Other    Heart attack Brother        x 3   Heart failure Mother        chf   Social History   Socioeconomic History   Marital status: Single    Spouse name: Not on file   Number of children: 0   Years of education: Not on file   Highest education level: Not on file  Occupational History   Occupation: Retired  Tobacco Use   Smoking status: Former    Packs/day: 1.00    Types: Cigarettes    Quit date: 08/12/1995    Years since quitting: 26.2   Smokeless tobacco: Never  Substance and Sexual Activity   Alcohol use: No   Drug use: No   Sexual activity: Not on file  Other Topics Concern   Not on file  Social History Narrative   ** Merged History Encounter **       06/14/19 living at [facility] Strain: Not on file  Food Insecurity: Not on file  Transportation Needs: Not on file  Physical Activity: Not on file  Stress: Not on file  Social Connections: Not on file   Outpatient Medications Prior to Visit  Medication Sig   aspirin EC 81 MG tablet Take 81 mg by mouth daily. Swallow whole.   b complex vitamins capsule Take 1 capsule by mouth daily.   benzonatate (TESSALON) 200 MG capsule Take 200 mg by mouth in the morning and at bedtime.   cephALEXin  (KEFLEX) 500 MG capsule Take 1 capsule (500 mg total) by mouth 3 (three) times daily.   cholecalciferol (VITAMIN D) 25 MCG (1000 UNIT) tablet Take 1,000 Units by mouth daily.   mirtazapine (REMERON) 15 MG tablet Take 15 mg by mouth at bedtime.   polyethylene glycol (MIRALAX / GLYCOLAX) 17 g packet Take 17 g by mouth daily as needed for mild constipation.   Probiotic Product (PROBIOTIC-10 PO) Take by mouth.   diltiazem (CARDIZEM CD) 120 MG 24 hr capsule Take 1 capsule (120 mg total) by mouth daily.   No facility-administered medications prior to visit.   Allergies  Allergen Reactions   Antihistamines, Loratadine-Type Other (See Comments)    These cause nose bleeds   Loratadine Other (See Comments)    Caused a nose bleed   Namenda [Memantine] Other (See Comments)    Agitation   Immunization History  Administered Date(s) Administered   Influenza Split 01/13/2013   Influenza Whole 12/14/2011   Moderna Sars-Covid-2 Vaccination 03/14/2019, 04/11/2019, 02/01/2020   Pneumococcal Polysaccharide-23 01/13/2013   Tdap 05/20/2021    Review of Systems All review of systems negative except what is listed in the HPI   Objective    BP 115/68   Pulse 77   Ht _0  (1.651 m)   Wt 132 lb 11.2 oz (60.2 kg)   SpO2 97%   BMI 22.08 kg/m  Physical Exam Vitals and nursing note reviewed.  Constitutional:      General: She is not in acute distress.    Appearance: Normal appearance.  Eyes:     Extraocular Movements: Extraocular movements intact.     Conjunctiva/sclera: Conjunctivae normal.     Pupils: Pupils are equal, round, and reactive to light.  Neck:     Vascular: No carotid bruit.  Cardiovascular:     Rate and Rhythm: Normal rate and regular rhythm.     Pulses: Normal pulses.     Heart sounds: Normal heart sounds. No murmur heard. Pulmonary:     Effort: Pulmonary effort is normal.     Breath sounds: Normal breath sounds. No wheezing.  Abdominal:     General: Bowel sounds are  normal.     Palpations: Abdomen is soft.  Musculoskeletal:        General: Normal range of motion.     Cervical back: Normal range of motion.     Right lower leg: No edema.     Left lower leg: No edema.  Skin:    General: Skin is warm and dry.     Capillary Refill: Capillary refill takes less than 2 seconds.  Neurological:     Mental Status: She is alert. Mental status is at baseline.     Motor: Weakness present.  Psychiatric:        Attention and Perception: She is inattentive.        Mood and Affect: Affect is flat.        Speech: Speech is delayed.        Behavior: Behavior is cooperative.        Thought Content: Thought content normal.        Cognition and Memory: Memory is impaired. She exhibits impaired remote memory.        Judgment: Judgment normal.     Results for orders placed or performed in visit on 09/18/21  CBC With Diff/Platelet  Result Value Ref Range   WBC 8.3 3.4 - 10.8 x10E3/uL   RBC 4.98 3.77 - 5.28 x10E6/uL   Hemoglobin 13.3 11.1 - 15.9 g/dL   Hematocrit 41.9 34.0 - 46.6 %   MCV 84 79 - 97 fL   MCH 26.7 26.6 - 33.0 pg   MCHC 31.7 31.5 - 35.7 g/dL   RDW 13.4 11.7 - 15.4 %   Platelets 280 150 - 450 x10E3/uL   Neutrophils 57 Not Estab. %   Lymphs 29 Not Estab. %   Monocytes 8 Not Estab. %   Eos 4 Not Estab. %   Basos 1 Not Estab. %   Neutrophils Absolute 4.7 1.4 - 7.0 x10E3/uL   Lymphocytes Absolute 2.4 0.7 - 3.1 x10E3/uL   Monocytes Absolute 0.7 0.1 - 0.9 x10E3/uL   EOS (ABSOLUTE) 0.3 0.0 - 0.4 x10E3/uL   Basophils Absolute 0.1 0.0 - 0.2 x10E3/uL   Immature Granulocytes 1 Not Estab. %   Immature Grans (Abs) 0.1 0.0 - 0.1 x10E3/uL  Comprehensive metabolic panel  Result Value Ref Range   Glucose 110 (H) 70 - 99 mg/dL   BUN 24 10 - 36 mg/dL   Creatinine, Ser 0.75 0.57 - 1.00 mg/dL   eGFR 75 >59 mL/min/1.73   BUN/Creatinine Ratio 32 (H) 12 - 28   Sodium 140 134 - 144 mmol/L   Potassium 4.4 3.5 - 5.2 mmol/L   Chloride 99 96 - 106 mmol/L   CO2 20  20 - 29 mmol/L   Calcium 9.8 8.7 - 10.3 mg/dL   Total Protein 7.4 6.0 - 8.5 g/dL   Albumin 4.4 3.5 - 4.6 g/dL   Globulin, Total 3.0 1.5 - 4.5 g/dL   Albumin/Globulin Ratio 1.5 1.2 - 2.2   Bilirubin Total 0.2 0.0 - 1.2 mg/dL   Alkaline Phosphatase 99 44 - 121 IU/L   AST 18 0 - 40 IU/L   ALT 11 0 - 32 IU/L  Iron, TIBC and Ferritin Panel  Result Value Ref Range   Total Iron Binding Capacity 295 250 - 450 ug/dL   UIBC 245 118 - 369 ug/dL   Iron 50 27 - 139 ug/dL   Iron Saturation 17 15 - 55 %   Ferritin 328 (H) 15 - 150 ng/mL  Lipid panel  Result Value Ref Range   Cholesterol, Total 275 (H) 100 - 199 mg/dL   Triglycerides 213 (H) 0 - 149 mg/dL   HDL 56 >39 mg/dL   VLDL Cholesterol Cal 40 5 - 40 mg/dL   LDL Chol Calc (NIH) 179 (H) 0 - 99 mg/dL   Chol/HDL Ratio 4.9 (H) 0.0 - 4.4 ratio    Assessment & Plan      Problem List Items Addressed  This Visit     Hyperlipidemia - Primary    Chronic.  Currently well-managed.  Will obtain labs today.  No changes to plan of care.      Relevant Orders   CBC With Diff/Platelet (Completed)   Comprehensive metabolic panel (Completed)   Iron, TIBC and Ferritin Panel (Completed)   Lipid panel (Completed)   History of CVA with residual deficit    History of CVA with weakness present.  No new or alarm symptoms are present at this time.  Recommend tight blood pressure and lipid control as well as close monitoring of atrial fibrillation.  Decision to anticoagulate needs to be closely evaluated given her increased risk of falls.      Essential hypertension    Chronic.  Well-controlled at this time.  No alarm symptoms are present.  Will obtain baseline labs today.  Continue current regimen.      Relevant Orders   CBC With Diff/Platelet (Completed)   Comprehensive metabolic panel (Completed)   Iron, TIBC and Ferritin Panel (Completed)   Lipid panel (Completed)   Paroxysmal atrial fibrillation (HCC)    Chronic.  Regular rate and rhythm present  today.  No alarm symptoms are present.  Given the history of thrombotic CVA recommend close monitoring.      Alzheimer's dementia ([place])    Chronic.  Deficits are apparent on examination today.  She is well cared for with round-the-clock supervision and excellent support system within her independent living facility.  Recommend close monitoring for new or worsening symptoms      Relevant Orders   CBC With Diff/Platelet (Completed)   Comprehensive metabolic panel (Completed)   Iron, TIBC and Ferritin Panel (Completed)   Lipid panel (Completed)   Transaminitis   Relevant Orders   CBC With Diff/Platelet (Completed)   Comprehensive metabolic panel (Completed)   Iron, TIBC and Ferritin Panel (Completed)   Lipid panel (Completed)     Return for 4 months chronic follow-up (26m.      Pinky Ravan, SCoralee Pesa NP, DNP, AGNP-C [facility]

## 2021-09-19 LAB — IRON,TIBC AND FERRITIN PANEL
Ferritin: 328 ng/mL — ABNORMAL HIGH (ref 15–150)
Iron Saturation: 17 % (ref 15–55)
Iron: 50 ug/dL (ref 27–139)
Total Iron Binding Capacity: 295 ug/dL (ref 250–450)
UIBC: 245 ug/dL (ref 118–369)

## 2021-09-19 LAB — COMPREHENSIVE METABOLIC PANEL
ALT: 11 IU/L (ref 0–32)
AST: 18 IU/L (ref 0–40)
Albumin/Globulin Ratio: 1.5 (ref 1.2–2.2)
Albumin: 4.4 g/dL (ref 3.5–4.6)
Alkaline Phosphatase: 99 IU/L (ref 44–121)
BUN/Creatinine Ratio: 32 — ABNORMAL HIGH (ref 12–28)
BUN: 24 mg/dL (ref 10–36)
Bilirubin Total: 0.2 mg/dL (ref 0.0–1.2)
CO2: 20 mmol/L (ref 20–29)
Calcium: 9.8 mg/dL (ref 8.7–10.3)
Chloride: 99 mmol/L (ref 96–106)
Creatinine, Ser: 0.75 mg/dL (ref 0.57–1.00)
Globulin, Total: 3 g/dL (ref 1.5–4.5)
Glucose: 110 mg/dL — ABNORMAL HIGH (ref 70–99)
Potassium: 4.4 mmol/L (ref 3.5–5.2)
Sodium: 140 mmol/L (ref 134–144)
Total Protein: 7.4 g/dL (ref 6.0–8.5)
eGFR: 75 mL/min/{1.73_m2} (ref >59)

## 2021-09-19 LAB — CBC WITH DIFF/PLATELET
Basophils Absolute: 0.1 10*3/uL (ref 0.0–0.2)
Basos: 1 %
EOS (ABSOLUTE): 0.3 10*3/uL (ref 0.0–0.4)
Eos: 4 %
Hematocrit: 41.9 % (ref 34.0–46.6)
Hemoglobin: 13.3 g/dL (ref 11.1–15.9)
Immature Grans (Abs): 0.1 10*3/uL (ref 0.0–0.1)
Immature Granulocytes: 1 %
Lymphocytes Absolute: 2.4 10*3/uL (ref 0.7–3.1)
Lymphs: 29 %
MCH: 26.7 pg (ref 26.6–33.0)
MCHC: 31.7 g/dL (ref 31.5–35.7)
MCV: 84 fL (ref 79–97)
Monocytes Absolute: 0.7 10*3/uL (ref 0.1–0.9)
Monocytes: 8 %
Neutrophils Absolute: 4.7 10*3/uL (ref 1.4–7.0)
Neutrophils: 57 %
Platelets: 280 10*3/uL (ref 150–450)
RBC: 4.98 x10E6/uL (ref 3.77–5.28)
RDW: 13.4 % (ref 11.7–15.4)
WBC: 8.3 10*3/uL (ref 3.4–10.8)

## 2021-09-19 LAB — LIPID PANEL
Chol/HDL Ratio: 4.9 ratio — ABNORMAL HIGH (ref 0.0–4.4)
Cholesterol, Total: 275 mg/dL — ABNORMAL HIGH (ref 100–199)
HDL: 56 mg/dL (ref >39)
LDL Chol Calc (NIH): 179 mg/dL — ABNORMAL HIGH (ref 0–99)
Triglycerides: 213 mg/dL — ABNORMAL HIGH (ref 0–149)
VLDL Cholesterol Cal: 40 mg/dL (ref 5–40)

## 2021-09-23 DIAGNOSIS — G308 Other Alzheimer's disease: Secondary | ICD-10-CM | POA: Diagnosis not present

## 2021-09-23 DIAGNOSIS — F0280 Dementia in other diseases classified elsewhere without behavioral disturbance: Secondary | ICD-10-CM | POA: Diagnosis not present

## 2021-10-05 DIAGNOSIS — B351 Tinea unguium: Secondary | ICD-10-CM | POA: Diagnosis not present

## 2021-10-05 DIAGNOSIS — M2041 Other hammer toe(s) (acquired), right foot: Secondary | ICD-10-CM | POA: Diagnosis not present

## 2021-10-19 NOTE — Assessment & Plan Note (Signed)
Chronic.  Currently well-managed.  Will obtain labs today.  No changes to plan of care.

## 2021-10-19 NOTE — Assessment & Plan Note (Signed)
History of CVA with weakness present.  No new or alarm symptoms are present at this time.  Recommend tight blood pressure and lipid control as well as close monitoring of atrial fibrillation.  Decision to anticoagulate needs to be closely evaluated given her increased risk of falls.

## 2021-10-19 NOTE — Assessment & Plan Note (Signed)
Chronic.  Well-controlled at this time.  No alarm symptoms are present.  Will obtain baseline labs today.  Continue current regimen.

## 2021-10-19 NOTE — Assessment & Plan Note (Signed)
Chronic.  Deficits are apparent on examination today.  She is well cared for with round-the-clock supervision and excellent support system within her independent living facility.  Recommend close monitoring for new or worsening symptoms

## 2021-10-19 NOTE — Assessment & Plan Note (Signed)
Chronic.  Regular rate and rhythm present today.  No alarm symptoms are present.  Given the history of thrombotic CVA recommend close monitoring.

## 2021-10-21 DIAGNOSIS — L720 Epidermal cyst: Secondary | ICD-10-CM | POA: Diagnosis not present

## 2021-10-21 DIAGNOSIS — L814 Other melanin hyperpigmentation: Secondary | ICD-10-CM | POA: Diagnosis not present

## 2021-10-21 DIAGNOSIS — D0439 Carcinoma in situ of skin of other parts of face: Secondary | ICD-10-CM | POA: Diagnosis not present

## 2021-10-21 DIAGNOSIS — Z85828 Personal history of other malignant neoplasm of skin: Secondary | ICD-10-CM | POA: Diagnosis not present

## 2021-11-01 DIAGNOSIS — R4182 Altered mental status, unspecified: Secondary | ICD-10-CM | POA: Diagnosis not present

## 2021-11-04 DIAGNOSIS — H401131 Primary open-angle glaucoma, bilateral, mild stage: Secondary | ICD-10-CM | POA: Diagnosis not present

## 2021-11-10 DIAGNOSIS — R399 Unspecified symptoms and signs involving the genitourinary system: Secondary | ICD-10-CM | POA: Diagnosis not present

## 2021-12-02 DIAGNOSIS — G308 Other Alzheimer's disease: Secondary | ICD-10-CM | POA: Diagnosis not present

## 2021-12-02 DIAGNOSIS — F0280 Dementia in other diseases classified elsewhere without behavioral disturbance: Secondary | ICD-10-CM | POA: Diagnosis not present

## 2021-12-07 ENCOUNTER — Ambulatory Visit (HOSPITAL_BASED_OUTPATIENT_CLINIC_OR_DEPARTMENT_OTHER): Payer: Medicare PPO | Admitting: Nurse Practitioner

## 2021-12-18 DIAGNOSIS — K824 Cholesterolosis of gallbladder: Secondary | ICD-10-CM | POA: Diagnosis not present

## 2021-12-18 DIAGNOSIS — I480 Paroxysmal atrial fibrillation: Secondary | ICD-10-CM | POA: Diagnosis not present

## 2021-12-18 DIAGNOSIS — Z23 Encounter for immunization: Secondary | ICD-10-CM | POA: Diagnosis not present

## 2021-12-18 DIAGNOSIS — I639 Cerebral infarction, unspecified: Secondary | ICD-10-CM | POA: Diagnosis not present

## 2021-12-18 DIAGNOSIS — N390 Urinary tract infection, site not specified: Secondary | ICD-10-CM | POA: Diagnosis not present

## 2021-12-18 DIAGNOSIS — D692 Other nonthrombocytopenic purpura: Secondary | ICD-10-CM | POA: Diagnosis not present

## 2021-12-18 DIAGNOSIS — G301 Alzheimer's disease with late onset: Secondary | ICD-10-CM | POA: Diagnosis not present

## 2021-12-18 DIAGNOSIS — R911 Solitary pulmonary nodule: Secondary | ICD-10-CM | POA: Diagnosis not present

## 2021-12-22 ENCOUNTER — Non-Acute Institutional Stay: Payer: Medicare PPO | Admitting: Family Medicine

## 2021-12-22 ENCOUNTER — Encounter: Payer: Self-pay | Admitting: Family Medicine

## 2021-12-22 VITALS — BP 122/72 | HR 83 | Resp 18

## 2021-12-22 DIAGNOSIS — G309 Alzheimer's disease, unspecified: Secondary | ICD-10-CM

## 2021-12-22 NOTE — Progress Notes (Unsigned)
Therapist, nutritional Palliative Care Consult Note Telephone: [phone]  Fax: [phone]    Date of encounter: 12/22/21 2:51 PM PATIENT NAME: Melinda Edwards [address] [place] [address]   [phone] (home)  DOB: [birth year] MRN: [id number] PRIMARY CARE PROVIDER:    Tollie Eth, NP,  [address] [place] [address] [phone]  REFERRING PROVIDER:   Tollie Eth, NP [address] [place],  [place] [address] [phone]  RESPONSIBLE PARTY:    Contact Information     Name Relation Home Work Mobile   [place] Legal Guardian [phone]  [phone]   Leota Sauers [phone]          I met face to face with patient, private paid caregiver Leanor Rubenstein and legal guardian Janit Bern in her assisted living facility. Palliative Care was asked to follow this patient by consultation request of  Early, Sung Amabile, NP to address advance care planning and complex medical decision making. This is a follow up visit   ASSESSMENT , SYMPTOM MANAGEMENT AND PLAN / RECOMMENDATIONS:   Alzheimer's Dementia FAST 7 score 6D Previously completed MOST in Epic Continue education of caregivers to be alert to need for likely assistance with perineal hygiene to help prevent infection. Monitoring for coughing or choking after intake as a sign of possible dysphagia.     Advance Care Planning/Goals of Care: Goals include to maximize quality of life and symptom management. Patient/health care surrogate gave his/her permission to discuss. Our advance care planning conversation included a discussion about:     Exploration of goals of care in the event of a sudden injury or illness  Identification of a healthcare agent-legal guardian Janit Bern [place] at [place] Review of an advance directive document -MOST. Decision not to resuscitate or to de-escalate disease focused treatments due  to poor prognosis. CODE STATUS: MOST 06/18/20: DNR/DNI with comfort measures Use of antibiotics on a case by case, time limited basis No IV fluids or feeding tube.      Follow up Palliative Care Visit: Palliative care will continue to follow for complex medical decision making, advance care planning, and clarification of goals. Return 4-6 weeks or prn.   This visit was coded based on medical decision making (MDM).  PPS: 60%  HOSPICE ELIGIBILITY/DIAGNOSIS: TBD  Chief Complaint:  Palliative Care is continuing to follow for chronic medical management in setting of dementia.  HISTORY OF PRESENT ILLNESS:  HIBO BLASDELL is a 88 y.o. year old female with Alzheimers, bronchiectasis, recent UTI, hx of CVA with residual deficits, paroxysmal atrial fibrillation not on anticoagulation with hx of GI bleed and advanced age.  Limited assist bathing/feeding/dressing.  Has personal sitter 24 hrs/day- 7 days/wk from 8 am-6 pm  (Tiny) and 6 pm to 8 am.  Magic cup bid. Has rollator walker. Denies CP, SOB, nausea, vomiting, dysuria or constipation. Appetite is good and pt's weight has been stable. Wears depends but can identify when she needs to go to the bathroom, occasional urinary incontinence.  History obtained from review of EMR, discussion with primary team, and interview with family, facility staff/caregiver and/or Ms. Kaley.   11/01/21 >100,000 lactose fermenting gnr (E coli) I reviewed EMR for available labs, medications, imaging, studies and related documents.  There are no new records since last visit/Records reviewed and summarized above.   ROS General: NAD ENMT: denies dysphagia Cardiovascular: denies chest pain, denies DOE Pulmonary: denies cough, denies increased SOB  Abdomen: endorses good appetite, denies constipation, endorses continence of bowel GU: denies dysuria, endorses continence of urine MSK:  denies increased weakness, no falls reported Skin: denies rashes or  wounds Neurological: denies pain, denies insomnia Psych: Endorses positive mood Heme/lymph/immuno: denies bruises, abnormal bleeding  Physical Exam: Current and past weights: 08/22/21 137.2 lbs, 09/18/21 132 lb  11.2 oz Constitutional: NAD General: thin and well dressed CV: S1S2, RRR, no LE edema Pulmonary: CTAB, no increased work of breathing, no cough, room air Abdomen: hypo-active BS + 4 quadrants, soft and non tender MSK: no sarcopenia, moves all extremities, ambulatory with rollator Skin: warm and dry, no rashes or wounds on visible skin Neuro:  no generalized weakness,  noted  cognitive impairment Psych: non-anxious affect, A and O x 2 Hem/lymph/immuno: no widespread bruising   Thank you for the opportunity to participate in the care of Ms. Bucker.  The palliative care team will continue to follow. Please call our office at [phone] if we can be of additional assistance.   Marijo Conception, FNP -C  COVID-19 PATIENT SCREENING TOOL Asked and negative response unless otherwise noted:   Have you had symptoms of covid, tested positive or been in contact with someone with symptoms/positive test in the past 5-10 days?  Unknown

## 2021-12-23 ENCOUNTER — Encounter: Payer: Self-pay | Admitting: Family Medicine

## 2022-01-11 DIAGNOSIS — B351 Tinea unguium: Secondary | ICD-10-CM | POA: Diagnosis not present

## 2022-01-11 DIAGNOSIS — M2041 Other hammer toe(s) (acquired), right foot: Secondary | ICD-10-CM | POA: Diagnosis not present

## 2022-02-10 DIAGNOSIS — H401132 Primary open-angle glaucoma, bilateral, moderate stage: Secondary | ICD-10-CM | POA: Diagnosis not present

## 2022-02-11 DIAGNOSIS — R8290 Unspecified abnormal findings in urine: Secondary | ICD-10-CM | POA: Diagnosis not present

## 2022-02-15 DIAGNOSIS — M7731 Calcaneal spur, right foot: Secondary | ICD-10-CM | POA: Diagnosis not present

## 2022-02-15 DIAGNOSIS — M19071 Primary osteoarthritis, right ankle and foot: Secondary | ICD-10-CM | POA: Diagnosis not present

## 2022-02-16 DIAGNOSIS — M25471 Effusion, right ankle: Secondary | ICD-10-CM | POA: Diagnosis not present

## 2022-04-01 ENCOUNTER — Non-Acute Institutional Stay: Payer: Medicare PPO | Admitting: Family Medicine

## 2022-04-01 ENCOUNTER — Encounter: Payer: Self-pay | Admitting: Family Medicine

## 2022-04-01 VITALS — HR 61 | Temp 97.7°F | Resp 18 | Wt 141.0 lb

## 2022-04-01 DIAGNOSIS — N390 Urinary tract infection, site not specified: Secondary | ICD-10-CM

## 2022-04-01 DIAGNOSIS — U071 COVID-19: Secondary | ICD-10-CM | POA: Insufficient documentation

## 2022-04-01 DIAGNOSIS — F0280 Dementia in other diseases classified elsewhere without behavioral disturbance: Secondary | ICD-10-CM

## 2022-04-01 NOTE — Progress Notes (Signed)
[facility] Consult Note Telephone: [phone]  Fax: [phone]    Date of encounter: 04/01/22 4:22 PM PATIENT NAME: Melinda Edwards [address] [place] [address]   [phone] (home)  DOB: [birth year] MRN: [id number] PRIMARY CARE PROVIDER:    Orma Render, NP,  [address] [place] [address] [phone]  REFERRING PROVIDER:   Orma Render, NP [address] [place],  [place] [address] [phone]  RESPONSIBLE PARTY:    Contact Information     Name Relation Home Work [place] [phone]  [phone]   Sabino Dick [phone]          I met face to face with patient, private paid caregiver Melinda Edwards and legal guardian Melinda Edwards in her assisted living facility. Palliative Care was asked to follow this patient by consultation request of  Melinda Edwards, Melinda Pesa, NP to address advance care planning and complex medical decision making. This is a follow up visit   ASSESSMENT , SYMPTOM MANAGEMENT AND PLAN / RECOMMENDATIONS:   Alzheimer's Dementia FAST 7 score 6D Agree with 24/7 caregiver supervision  2.  Recent Covid/UTI Resolving, completed treatment Using good infection prevention practices-avoiding crowds, handwashing. Encourage increased fluid intake    Advance Care Planning/Goals of Care: Goals include to maximize quality of life and symptom management.  Identification of a healthcare agent-legal guardian Melinda Edwards who lives in [place] at [place] of an advance directive document -MOST. Decision not to resuscitate or to de-escalate disease focused treatments due to poor prognosis. CODE STATUS: MOST 06/18/20: DNR/DNI with comfort measures Use of antibiotics on a case by case, time limited basis No IV fluids or feeding tube.      Follow up Palliative Care Visit: Palliative care will continue to follow for complex  medical decision making, advance care planning, and clarification of goals. Return 4-6 weeks or prn.   This visit was coded based on medical decision making (MDM).  PPS: 60%  HOSPICE ELIGIBILITY/DIAGNOSIS: TBD  Chief Complaint:  Palliative Care is continuing to follow for chronic medical management in setting of dementia with recent COVID infection and UTI.  HISTORY OF PRESENT ILLNESS:  Melinda Edwards is a 89 y.o. year old female with Alzheimers, bronchiectasis, hx of CVA with residual deficits, paroxysmal atrial fibrillation not on anticoagulation with hx of GI bleed and advanced age.  Limited assist bathing/feeding/dressing.  Has personal sitter 24 hrs/day- 7 days/wk from 8 am-6 pm  (Melinda Edwards) and 6 pm to 8 am.  Magic cup bid. Has rollator walker. Denies CP, SOB, nausea, vomiting, dysuria or constipation. Appetite is good and weight has been stable. Had Covid last week and UTI, was treated for both by outside PCP. Only symptomatic with increased fatigue, small increase in confusion and increased incidence of intermittent urinary incontinence per paid caregiver.  History obtained from review of EMR, discussion with private caregiver and/or Melinda Edwards.   02/15/22 right ankle xray [facility] at facility: Impression: 1.  No radiographic evidence of acute fracture and dislocation. If there are persistent symptoms, follow up xray may be obtained. 2.  Heel spur 3.  Achilles tendon attachment spur 4.  Mild Osteopenia 5.  Mild osteoarthritis  I reviewed EMR for available labs, medications, imaging, studies and related documents.  Records reviewed and summarized above.   ROS General: NAD, recent increased fatigue with covid last week ENMT: caregiver denies dysphagia Cardiovascular: denies chest pain, denies DOE  Pulmonary: denies cough, denies increased SOB Abdomen: endorses good appetite, denies constipation, endorses continence of bowel GU: denies dysuria, endorses occasional  incontinence of urine MSK:  denies increased weakness, no falls reported Skin: denies rashes or wounds Neurological: denies pain, denies insomnia Psych: Endorses positive mood Heme/lymph/immuno: denies bruises, abnormal bleeding  Physical Exam: Current and past weights: 08/12/21 143.2 lbs per FL2, 141 lbs on 03/24/22 Constitutional: NAD General: thin and well dressed EENT:   Hard of hearing, wears hearing aids CV: S1S2, RRR, no LE edema Pulmonary: CTAB, no increased work of breathing, no cough, room air Abdomen: normo-active BS + 4 quadrants, soft and non tender MSK: no sarcopenia, moves all extremities, ambulatory with rollator Skin: warm and dry, no rashes or wounds on visible skin Neuro:  no generalized weakness, noted cognitive impairment Psych: non-anxious affect, A and O x 2, appears irritable with slight facial scowl Hem/lymph/immuno: no widespread bruising   Thank you for the opportunity to participate in the care of Ms. [place]now.  The palliative care team will continue to follow. Please call our office at [phone] if we can be of additional assistance.   Melinda Conception, FNP -C  COVID-19 PATIENT SCREENING TOOL Asked and negative response unless otherwise noted:   Have you had symptoms of covid, tested positive or been in contact with someone with symptoms/positive test in the past 5-10 days?  Covid + last week

## 2022-05-26 DIAGNOSIS — Z0000 Encounter for general adult medical examination without abnormal findings: Secondary | ICD-10-CM | POA: Diagnosis not present

## 2022-05-26 DIAGNOSIS — D6869 Other thrombophilia: Secondary | ICD-10-CM | POA: Diagnosis not present

## 2022-05-26 DIAGNOSIS — I480 Paroxysmal atrial fibrillation: Secondary | ICD-10-CM | POA: Diagnosis not present

## 2022-05-26 DIAGNOSIS — G301 Alzheimer's disease with late onset: Secondary | ICD-10-CM | POA: Diagnosis not present

## 2022-05-26 DIAGNOSIS — J449 Chronic obstructive pulmonary disease, unspecified: Secondary | ICD-10-CM | POA: Diagnosis not present

## 2022-05-26 DIAGNOSIS — J8410 Pulmonary fibrosis, unspecified: Secondary | ICD-10-CM | POA: Diagnosis not present

## 2022-05-26 DIAGNOSIS — D692 Other nonthrombocytopenic purpura: Secondary | ICD-10-CM | POA: Diagnosis not present

## 2022-05-26 DIAGNOSIS — I700 Atherosclerosis of aorta: Secondary | ICD-10-CM | POA: Diagnosis not present

## 2022-05-27 ENCOUNTER — Encounter: Payer: Self-pay | Admitting: Family Medicine

## 2022-05-27 ENCOUNTER — Non-Acute Institutional Stay: Payer: Medicare PPO | Admitting: Family Medicine

## 2022-05-27 VITALS — BP 128/72 | HR 81 | Temp 98.0°F | Resp 16

## 2022-05-27 DIAGNOSIS — G309 Alzheimer's disease, unspecified: Secondary | ICD-10-CM | POA: Diagnosis not present

## 2022-05-27 DIAGNOSIS — F0280 Dementia in other diseases classified elsewhere without behavioral disturbance: Secondary | ICD-10-CM | POA: Diagnosis not present

## 2022-05-27 DIAGNOSIS — U071 COVID-19: Secondary | ICD-10-CM | POA: Diagnosis not present

## 2022-05-27 DIAGNOSIS — N390 Urinary tract infection, site not specified: Secondary | ICD-10-CM | POA: Diagnosis not present

## 2022-05-27 NOTE — Progress Notes (Signed)
[facility] Palliative Care Consult Note Telephone: [phone]  Fax: [phone]   Date of encounter: 05/27/22 3:59 PM PATIENT NAME: Melinda Edwards [id number] [place] [address]   [phone] (home)  DOB: 1927/02/07 MRN: [id number] PRIMARY CARE PROVIDER:    Orma Render, NP,  [address] [place] [address] [phone]  REFERRING PROVIDER:   Orma Render, NP [address] [place],  [place] [address] [phone]  Health Care Agent:   Melinda Edwards     Name Relation Home Work [place] [phone]  [phone]   Melinda Edwards [phone]          I met face to face with patient in [facility] facility. Palliative Care was asked to follow this patient by consultation request of Early, Coralee Pesa, NP to address advance care planning and complex medical decision making. This is a follow up visit.  ADVANCE CARE PLANNING/GOALS OF CARE:  Legal Guardian:  Melinda Edwards who lives in [place] at [place]: MOST 06/18/20: DNR/DNI with comfort measures Use of antibiotics on a case by case, time limited basis No IV fluids or feeding tube.   ASSESSMENT AND / RECOMMENDATIONS:  PPS: 60%  Alzheimer's dementia FAST 7 score 6D Continue 24/7 caregiver supervision and magic cup supplements as pt will consume. No recent weight, physically visibly consistent with last assessment.   Follow up Palliative Care Visit:  Palliative Care continuing to follow up by monitoring for changes in appetite, weight, functional and cognitive status for chronic disease progression and management in agreement with patient's stated goals of care. Next visit in 4-6 weeks or prn.  This visit was coded based on medical decision making (MDM).  Chief Complaint  Palliative Care is continuing to follow up patient for chronic medical  symptom management in setting of dementia.  HISTORY OF PRESENT ILLNESS: Melinda Edwards is a 89 y.o. year old female with Alzheimer's dementia who had recent URI viral illness. Caregiver reports increased sleeping, decreased energy and appetite with recent illness. Pt dozing, arousable, denies pain, SOB, dysuria, nausea or vomiting. Caregiver denies any recent falls.   ACTIVITIES OF DAILY LIVING: CONTINENT OF BLADDER/ BOWEL? No  MOBILITY:  AMBULATORY WITH ASSISTIVE DEVICE: Rolling walker  APPETITE? Waxing/waning since infection  CURRENT PROBLEM LIST:  Patient Active Problem List   Diagnosis Date Noted   COVID-19 virus infection 04/01/2022   Transaminitis 08/13/2021   Constipation 08/13/2021   Volume depletion 08/13/2021   Hemoglobin decreased 03/17/2020   Lumbar L2 compression fracture, with routine healing, subsequent encounter 03/17/2020   Alzheimer's dementia ([facility])    GI bleed 03/16/2020   Paroxysmal atrial fibrillation ([facility]) 05/14/2019   History of CVA with residual deficit 05/10/2019   Essential hypertension 05/10/2019   Aortic stenosis, mild 04/13/2011   Benign hypertensive heart disease without heart failure 08/13/2010   Hyperlipidemia 08/13/2010   Bronchiectasis without acute exacerbation ([facility]) 11/15/2007   PAST MEDICAL HISTORY:  Active Ambulatory Problems    Diagnosis Date Noted   Bronchiectasis without acute exacerbation ([facility]) 11/15/2007   Benign hypertensive heart disease without heart failure 08/13/2010   Hyperlipidemia 08/13/2010   Aortic stenosis, mild 04/13/2011   History of CVA with residual deficit 05/10/2019   Essential hypertension 05/10/2019   GI bleed 03/16/2020   Hemoglobin decreased 03/17/2020   Paroxysmal atrial fibrillation ([facility]) 05/14/2019   Alzheimer's dementia ([facility])    Lumbar L2 compression fracture,  with routine healing, subsequent encounter 03/17/2020   Transaminitis 08/13/2021   Constipation 08/13/2021   Volume depletion 08/13/2021    COVID-19 virus infection 04/01/2022   Resolved Ambulatory Problems    Diagnosis Date Noted   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   HYPERLIPIDEMIA 07/07/2007   Bronchiectasis with acute exacerbation ([facility]) 07/07/2007   CHEST PAIN, ATYPICAL 08/04/2009   Nonspecific (abnormal) findings on radiological and other examination of body structure 07/07/2007   CT, CHEST, ABNORMAL 07/07/2007   Palpitations 08/13/2010   Nocturnal leg cramps 08/08/2012   Atrial fibrillation ([facility]) 05/14/2019   Acute UTI (urinary tract infection) 08/13/2021   Acute respiratory failure with hypoxia ([facility]) 08/14/2021   UTI (urinary tract infection) 08/14/2021   Past Medical History:  Diagnosis Date   Hypertension    PAC (premature atrial contraction)    Pulmonary fibrosis ([facility])    Pulmonary nodule    Stroke ([facility]) 05/10/2019   Stroke ([facility]) 06/16/2019       Preferred Pharmacy: ALLERGIES:  Allergies  Allergen Reactions   Antihistamines, Loratadine-Type Other (See Comments)    These cause nose bleeds   Loratadine Other (See Comments)    Caused a nose bleed   Namenda [Memantine] Other (See Comments)    Agitation     PERTINENT MEDICATIONS:  Outpatient Encounter Medications as of 05/27/2022  Medication Sig   aspirin EC 81 MG tablet Take 81 mg by mouth daily. Swallow whole.   ASPIRIN LOW DOSE 81 MG chewable tablet TAKE 1 TABLET BY MOUTH DAILY FOR HEART HEALTH. (Patient not taking: Reported on 04/01/2022)   b complex vitamins capsule Take 1 capsule by mouth daily.   benzonatate (TESSALON) 200 MG capsule Take 200 mg by mouth in the morning and at bedtime.   cholecalciferol (VITAMIN D) 25 MCG (1000 UNIT) tablet Take 1,000 Units by mouth daily.   diltiazem (CARDIZEM CD) 120 MG 24 hr capsule Take 1 capsule (120 mg total) by mouth daily.   mirtazapine (REMERON) 15 MG tablet Take 15 mg by mouth at bedtime.   polyethylene glycol (MIRALAX / GLYCOLAX) 17 g packet Take 17 g by mouth daily as needed for  mild constipation.   Probiotic Product (PROBIOTIC-10 PO) Take by mouth.   No facility-administered encounter medications on file as of 05/27/2022.    History obtained from review of EMR, discussion with facility staff/caregiver and/or patient.     I reviewed available labs, medications, imaging, studies and related documents from the EMR.   CBC 04/28/22 remarkable for mildly low HGB 11.8. CMP unremarkable Mag was 2.2 HGB A1c 6.0%   Records reviewed and summarized above.   Physical Exam: GENERAL: NAD LUNGS: Coarse lung sounds present throughout all fields on L side, R side coarse upper lobe with RML/RLL clear to auscultation, no increased work of breathing, room air  CARDIAC:  S1S2, RRR with no MRG, N edema, N cyanosis ABD:  Hypo-active BS x 4 quads, soft, non-tender EXTREMITIES: 4/5 grip strength bilaterally, No deformity, muscle atrophy or subcutaneous fat loss NEURO:  No weakness, short term recall/memory deficit PSYCH:  non-anxious affect, A & O x 3, forgetful   Thank you for the opportunity to participate in the care of [employer]. Please call our main office at [phone] if we can be of additional assistance.    Damaris Hippo FNP-C  [email or web address] [facility]  Phone:  [phone]

## 2022-06-01 DIAGNOSIS — R32 Unspecified urinary incontinence: Secondary | ICD-10-CM | POA: Diagnosis not present

## 2022-06-01 DIAGNOSIS — R8290 Unspecified abnormal findings in urine: Secondary | ICD-10-CM | POA: Diagnosis not present

## 2022-07-28 DIAGNOSIS — G308 Other Alzheimer's disease: Secondary | ICD-10-CM | POA: Diagnosis not present

## 2022-07-28 DIAGNOSIS — F0280 Dementia in other diseases classified elsewhere without behavioral disturbance: Secondary | ICD-10-CM | POA: Diagnosis not present

## 2022-07-29 ENCOUNTER — Non-Acute Institutional Stay: Payer: Medicare PPO | Admitting: Family Medicine

## 2022-07-29 ENCOUNTER — Encounter: Payer: Self-pay | Admitting: Family Medicine

## 2022-07-29 VITALS — HR 70 | Temp 97.3°F | Resp 16

## 2022-07-29 DIAGNOSIS — N390 Urinary tract infection, site not specified: Secondary | ICD-10-CM | POA: Diagnosis not present

## 2022-07-29 DIAGNOSIS — U071 COVID-19: Secondary | ICD-10-CM | POA: Diagnosis not present

## 2022-07-29 DIAGNOSIS — G309 Alzheimer's disease, unspecified: Secondary | ICD-10-CM | POA: Diagnosis not present

## 2022-07-29 DIAGNOSIS — F0280 Dementia in other diseases classified elsewhere without behavioral disturbance: Secondary | ICD-10-CM

## 2022-07-29 NOTE — Progress Notes (Signed)
Therapist, nutritional Palliative Care Consult Note Telephone: [phone]  Fax: [phone]   Date of encounter: 05/27/22 3:59 PM PATIENT NAME: Melinda Edwards [address] [place] [address]   [phone] (home)  DOB: June 06, 1926 MRN: [id number] PRIMARY CARE PROVIDER:    Tollie Eth, NP,  [address] [place] [address] [phone]  REFERRING PROVIDER:   Tollie Eth, NP [address] [place],  [place] [address] [phone]  Health Care Agent:   Janit Bern Contact Information     Name Relation Home Work [facility] Legal Guardian [phone]  [phone]   Leota Sauers [phone]          I met face to face with patient in [facility] facility. Palliative Care was asked to follow this patient by consultation request of Early, Sung Amabile, NP to address advance care planning and complex medical decision making. This is a follow up visit.  ADVANCE CARE PLANNING/GOALS OF CARE:  Legal Guardian:  Janit Bern who lives [place] at [facility]  CODE STATUS: MOST 06/18/20: DNR/DNI with comfort measures Use of antibiotics on a case by case, time limited basis No IV fluids or feeding tube.   ASSESSMENT AND / RECOMMENDATIONS:  PPS: 60%  Alzheimer's dementia FAST 7 score 6D Continue 24/7 caregiver supervision. No recent weight, physical assessment visibly consistent with last assessment. Currently thriving.   Follow up Palliative Care Visit:  Palliative Care continuing to follow up by monitoring for changes in appetite, weight, functional and cognitive status for chronic disease progression and management in agreement with patient's stated goals of care. Next visit in 4-6 weeks or prn.  This visit was coded based on medical decision making (MDM).  Chief Complaint  Palliative Care is continuing to follow up patient for chronic medical symptom management  in setting of dementia.  HISTORY OF PRESENT ILLNESS: Melinda Edwards is a 89 y.o. year old female with Alzheimer's dementia. Caregiver reports increased sleeping. Pt denies pain, SOB, dysuria, nausea or vomiting. Caregiver denies any recent falls. She was out earlier today with her caregiver and [facility] PO[facility].    ACTIVITIES OF DAILY LIVING: CONTINENT OF BLADDER/ BOWEL? yes  MOBILITY:  AMBULATORY WITH ASSISTIVE DEVICE: Rolling walker  APPETITE? good  CURRENT PROBLEM LIST:  Patient Active Problem List   Diagnosis Date Noted   COVID-19 virus infection 04/01/2022   Transaminitis 08/13/2021   Constipation 08/13/2021   Volume depletion 08/13/2021   Hemoglobin decreased 03/17/2020   Lumbar L2 compression fracture, with routine healing, subsequent encounter 03/17/2020   Alzheimer's dementia ([facility])    GI bleed 03/16/2020   Paroxysmal atrial fibrillation ([facility]) 05/14/2019   History of CVA with residual deficit 05/10/2019   Essential hypertension 05/10/2019   Aortic stenosis, mild 04/13/2011   Benign hypertensive heart disease without heart failure 08/13/2010   Hyperlipidemia 08/13/2010   Bronchiectasis without acute exacerbation ([facility]) 11/15/2007   PAST MEDICAL HISTORY:  Active Ambulatory Problems    Diagnosis Date Noted   Bronchiectasis without acute exacerbation ([facility]) 11/15/2007   Benign hypertensive heart disease without heart failure 08/13/2010   Hyperlipidemia 08/13/2010   Aortic stenosis, mild 04/13/2011   History of CVA with residual deficit 05/10/2019   Essential hypertension 05/10/2019   GI bleed 03/16/2020   Hemoglobin decreased 03/17/2020   Paroxysmal atrial fibrillation ([facility]) 05/14/2019   Alzheimer's dementia ([facility])    Lumbar L2 compression fracture, with routine healing, subsequent encounter 03/17/2020   Transaminitis 08/13/2021  Constipation 08/13/2021   Volume depletion 08/13/2021   COVID-19 virus infection 04/01/2022   Resolved Ambulatory Problems    Diagnosis Date  Noted   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   HYPERLIPIDEMIA 07/07/2007   Bronchiectasis with acute exacerbation ([facility]) 07/07/2007   CHEST PAIN, ATYPICAL 08/04/2009   Nonspecific (abnormal) findings on radiological and other examination of body structure 07/07/2007   CT, CHEST, ABNORMAL 07/07/2007   Palpitations 08/13/2010   Nocturnal leg cramps 08/08/2012   Atrial fibrillation (HCC) 05/14/2019   Acute UTI (urinary tract infection) 08/13/2021   Acute respiratory failure with hypoxia ([facility]) 08/14/2021   UTI (urinary tract infection) 08/14/2021   Past Medical History:  Diagnosis Date   Hypertension    PAC (premature atrial contraction)    Pulmonary fibrosis (HCC)    Pulmonary nodule    Stroke (HCC) 05/10/2019   Stroke ([facility]) 06/16/2019       Preferred Pharmacy: ALLERGIES:  Allergies  Allergen Reactions   Antihistamines, Loratadine-Type Other (See Comments)    These cause nose bleeds   Loratadine Other (See Comments)    Caused a nose bleed   Namenda [Memantine] Other (See Comments)    Agitation     PERTINENT MEDICATIONS:  Outpatient Encounter Medications as of 05/27/2022  Medication Sig   aspirin EC 81 MG tablet Take 81 mg by mouth daily. Swallow whole.   ASPIRIN LOW DOSE 81 MG chewable tablet TAKE 1 TABLET BY MOUTH DAILY FOR HEART HEALTH. (Patient not taking: Reported on 04/01/2022)   b complex vitamins capsule Take 1 capsule by mouth daily.   benzonatate (TESSALON) 200 MG capsule Take 200 mg by mouth in the morning and at bedtime.   cholecalciferol (VITAMIN D) 25 MCG (1000 UNIT) tablet Take 1,000 Units by mouth daily.   diltiazem (CARDIZEM CD) 120 MG 24 hr capsule Take 1 capsule (120 mg total) by mouth daily.   mirtazapine (REMERON) 15 MG tablet Take 15 mg by mouth at bedtime.   polyethylene glycol (MIRALAX / GLYCOLAX) 17 g packet Take 17 g by mouth daily as needed for mild constipation.   Probiotic Product (PROBIOTIC-10 PO) Take by mouth.   No  facility-administered encounter medications on file as of 05/27/2022.    History obtained from review of EMR, discussion with paid caregiver Tiny and/or patient.     I reviewed available labs, medications, imaging, studies and related documents from the EMR.     There are no new records or imaging studies in EMR.   Physical Exam: GENERAL: NAD LUNGS: CTAB, no increased work of breathing, room air CARDIAC:  S1S2, RRR with no MRG, No edema/ cyanosis ABD:  Normo-active BS x 4 quads, soft, non-tender EXTREMITIES: No deformity, muscle atrophy or subcutaneous fat loss NEURO:  No weakness, short term recall/memory deficit PSYCH:  non-anxious affect, A & O x 3, forgetful   Thank you for the opportunity to participate in the care of [employer]. Please call our main office at [phone] if we can be of additional assistance.    Joycelyn Man FNP-C  [email or web address]  Phone:  [phone]

## 2022-08-04 DIAGNOSIS — I480 Paroxysmal atrial fibrillation: Secondary | ICD-10-CM | POA: Diagnosis not present

## 2022-08-04 DIAGNOSIS — F32A Depression, unspecified: Secondary | ICD-10-CM | POA: Diagnosis not present

## 2022-08-04 DIAGNOSIS — G479 Sleep disorder, unspecified: Secondary | ICD-10-CM | POA: Diagnosis not present

## 2022-08-04 DIAGNOSIS — F0283 Dementia in other diseases classified elsewhere, unspecified severity, with mood disturbance: Secondary | ICD-10-CM | POA: Diagnosis not present

## 2022-08-04 DIAGNOSIS — G301 Alzheimer's disease with late onset: Secondary | ICD-10-CM | POA: Diagnosis not present

## 2022-08-12 DIAGNOSIS — H401131 Primary open-angle glaucoma, bilateral, mild stage: Secondary | ICD-10-CM | POA: Diagnosis not present

## 2022-08-12 DIAGNOSIS — Z961 Presence of intraocular lens: Secondary | ICD-10-CM | POA: Diagnosis not present

## 2022-08-18 DIAGNOSIS — Z7189 Other specified counseling: Secondary | ICD-10-CM | POA: Diagnosis not present

## 2022-08-18 DIAGNOSIS — G479 Sleep disorder, unspecified: Secondary | ICD-10-CM | POA: Diagnosis not present

## 2022-08-18 DIAGNOSIS — I480 Paroxysmal atrial fibrillation: Secondary | ICD-10-CM | POA: Diagnosis not present

## 2022-08-18 DIAGNOSIS — G301 Alzheimer's disease with late onset: Secondary | ICD-10-CM | POA: Diagnosis not present

## 2022-08-18 DIAGNOSIS — D649 Anemia, unspecified: Secondary | ICD-10-CM | POA: Diagnosis not present

## 2022-08-18 DIAGNOSIS — F32A Depression, unspecified: Secondary | ICD-10-CM | POA: Diagnosis not present

## 2022-08-18 DIAGNOSIS — F0283 Dementia in other diseases classified elsewhere, unspecified severity, with mood disturbance: Secondary | ICD-10-CM | POA: Diagnosis not present

## 2022-08-25 DIAGNOSIS — R39198 Other difficulties with micturition: Secondary | ICD-10-CM | POA: Diagnosis not present

## 2022-09-09 DIAGNOSIS — N390 Urinary tract infection, site not specified: Secondary | ICD-10-CM | POA: Diagnosis not present

## 2022-09-28 DIAGNOSIS — Z9989 Dependence on other enabling machines and devices: Secondary | ICD-10-CM | POA: Diagnosis not present

## 2022-09-28 DIAGNOSIS — R911 Solitary pulmonary nodule: Secondary | ICD-10-CM | POA: Diagnosis not present

## 2022-09-28 DIAGNOSIS — G309 Alzheimer's disease, unspecified: Secondary | ICD-10-CM | POA: Diagnosis not present

## 2022-09-28 DIAGNOSIS — E559 Vitamin D deficiency, unspecified: Secondary | ICD-10-CM | POA: Diagnosis not present

## 2022-09-28 DIAGNOSIS — Z8719 Personal history of other diseases of the digestive system: Secondary | ICD-10-CM | POA: Diagnosis not present

## 2022-09-28 DIAGNOSIS — E782 Mixed hyperlipidemia: Secondary | ICD-10-CM | POA: Diagnosis not present

## 2022-09-28 DIAGNOSIS — G479 Sleep disorder, unspecified: Secondary | ICD-10-CM | POA: Diagnosis not present

## 2022-09-28 DIAGNOSIS — F02B3 Dementia in other diseases classified elsewhere, moderate, with mood disturbance: Secondary | ICD-10-CM | POA: Diagnosis not present

## 2022-09-28 DIAGNOSIS — I480 Paroxysmal atrial fibrillation: Secondary | ICD-10-CM | POA: Diagnosis not present

## 2022-10-06 DIAGNOSIS — F0280 Dementia in other diseases classified elsewhere without behavioral disturbance: Secondary | ICD-10-CM | POA: Diagnosis not present

## 2022-10-06 DIAGNOSIS — G308 Other Alzheimer's disease: Secondary | ICD-10-CM | POA: Diagnosis not present

## 2022-11-03 DIAGNOSIS — G308 Other Alzheimer's disease: Secondary | ICD-10-CM | POA: Diagnosis not present

## 2022-11-03 DIAGNOSIS — F0280 Dementia in other diseases classified elsewhere without behavioral disturbance: Secondary | ICD-10-CM | POA: Diagnosis not present

## 2022-11-05 DIAGNOSIS — Z789 Other specified health status: Secondary | ICD-10-CM | POA: Diagnosis not present

## 2022-11-05 DIAGNOSIS — H6123 Impacted cerumen, bilateral: Secondary | ICD-10-CM | POA: Diagnosis not present

## 2022-11-05 DIAGNOSIS — Z6822 Body mass index (BMI) 22.0-22.9, adult: Secondary | ICD-10-CM | POA: Diagnosis not present

## 2022-11-24 DIAGNOSIS — R410 Disorientation, unspecified: Secondary | ICD-10-CM | POA: Diagnosis not present

## 2022-11-26 DIAGNOSIS — J8410 Pulmonary fibrosis, unspecified: Secondary | ICD-10-CM | POA: Diagnosis not present

## 2022-11-26 DIAGNOSIS — G301 Alzheimer's disease with late onset: Secondary | ICD-10-CM | POA: Diagnosis not present

## 2022-11-26 DIAGNOSIS — I480 Paroxysmal atrial fibrillation: Secondary | ICD-10-CM | POA: Diagnosis not present

## 2022-11-26 DIAGNOSIS — D6869 Other thrombophilia: Secondary | ICD-10-CM | POA: Diagnosis not present

## 2022-11-26 DIAGNOSIS — I700 Atherosclerosis of aorta: Secondary | ICD-10-CM | POA: Diagnosis not present

## 2022-11-26 DIAGNOSIS — J449 Chronic obstructive pulmonary disease, unspecified: Secondary | ICD-10-CM | POA: Diagnosis not present

## 2022-11-26 DIAGNOSIS — R911 Solitary pulmonary nodule: Secondary | ICD-10-CM | POA: Diagnosis not present

## 2022-11-26 DIAGNOSIS — E7800 Pure hypercholesterolemia, unspecified: Secondary | ICD-10-CM | POA: Diagnosis not present

## 2022-11-26 DIAGNOSIS — F0280 Dementia in other diseases classified elsewhere without behavioral disturbance: Secondary | ICD-10-CM | POA: Diagnosis not present

## 2022-12-03 DIAGNOSIS — R410 Disorientation, unspecified: Secondary | ICD-10-CM | POA: Diagnosis not present

## 2022-12-10 ENCOUNTER — Emergency Department (HOSPITAL_BASED_OUTPATIENT_CLINIC_OR_DEPARTMENT_OTHER): Payer: Medicare PPO

## 2022-12-10 ENCOUNTER — Encounter (HOSPITAL_BASED_OUTPATIENT_CLINIC_OR_DEPARTMENT_OTHER): Payer: Self-pay

## 2022-12-10 ENCOUNTER — Other Ambulatory Visit: Payer: Self-pay

## 2022-12-10 ENCOUNTER — Emergency Department (HOSPITAL_BASED_OUTPATIENT_CLINIC_OR_DEPARTMENT_OTHER)
Admission: EM | Admit: 2022-12-10 | Discharge: 2022-12-10 | Disposition: A | Discharge status: Home / Self Care | Payer: Medicare PPO | Attending: Emergency Medicine | Admitting: Emergency Medicine

## 2022-12-10 DIAGNOSIS — Z1152 Encounter for screening for COVID-19: Secondary | ICD-10-CM | POA: Insufficient documentation

## 2022-12-10 DIAGNOSIS — R531 Weakness: Secondary | ICD-10-CM | POA: Insufficient documentation

## 2022-12-10 DIAGNOSIS — I350 Nonrheumatic aortic (valve) stenosis: Secondary | ICD-10-CM | POA: Insufficient documentation

## 2022-12-10 DIAGNOSIS — M7989 Other specified soft tissue disorders: Secondary | ICD-10-CM | POA: Insufficient documentation

## 2022-12-10 DIAGNOSIS — N390 Urinary tract infection, site not specified: Secondary | ICD-10-CM | POA: Diagnosis not present

## 2022-12-10 DIAGNOSIS — I10 Essential (primary) hypertension: Secondary | ICD-10-CM | POA: Diagnosis not present

## 2022-12-10 DIAGNOSIS — Z8673 Personal history of transient ischemic attack (TIA), and cerebral infarction without residual deficits: Secondary | ICD-10-CM | POA: Diagnosis not present

## 2022-12-10 DIAGNOSIS — F0280 Dementia in other diseases classified elsewhere without behavioral disturbance: Secondary | ICD-10-CM | POA: Diagnosis not present

## 2022-12-10 DIAGNOSIS — Z66 Do not resuscitate: Secondary | ICD-10-CM | POA: Insufficient documentation

## 2022-12-10 DIAGNOSIS — R600 Localized edema: Secondary | ICD-10-CM | POA: Insufficient documentation

## 2022-12-10 DIAGNOSIS — G309 Alzheimer's disease, unspecified: Secondary | ICD-10-CM | POA: Insufficient documentation

## 2022-12-10 LAB — URINALYSIS, ROUTINE W REFLEX MICROSCOPIC
Bilirubin Urine: NEGATIVE
Glucose, UA: 100 mg/dL — AB
Hgb urine dipstick: NEGATIVE
Ketones, ur: NEGATIVE mg/dL
Leukocytes,Ua: NEGATIVE
Nitrite: NEGATIVE
Specific Gravity, Urine: 1.026 (ref 1.005–1.030)
pH: 5.5 (ref 5.0–8.0)

## 2022-12-10 LAB — CBC
HCT: 39 % (ref 36.0–46.0)
Hemoglobin: 12.7 g/dL (ref 12.0–15.0)
MCH: 26.7 pg (ref 26.0–34.0)
MCHC: 32.6 g/dL (ref 30.0–36.0)
MCV: 81.9 fL (ref 80.0–100.0)
Platelets: 287 10*3/uL (ref 150–400)
RBC: 4.76 MIL/uL (ref 3.87–5.11)
RDW: 13.9 % (ref 11.5–15.5)
WBC: 10.1 10*3/uL (ref 4.0–10.5)
nRBC: 0 % (ref 0.0–0.2)

## 2022-12-10 LAB — HEPATIC FUNCTION PANEL
ALT: 16 U/L (ref 0–44)
AST: 21 U/L (ref 15–41)
Albumin: 3.8 g/dL (ref 3.5–5.0)
Alkaline Phosphatase: 80 U/L (ref 38–126)
Bilirubin, Direct: 0.1 mg/dL (ref 0.0–0.2)
Indirect Bilirubin: 0.4 mg/dL (ref 0.3–0.9)
Total Bilirubin: 0.5 mg/dL (ref 0.3–1.2)
Total Protein: 7.4 g/dL (ref 6.5–8.1)

## 2022-12-10 LAB — RESP PANEL BY RT-PCR (RSV, FLU A&B, COVID)  RVPGX2
Influenza A by PCR: NEGATIVE
Influenza B by PCR: NEGATIVE
Resp Syncytial Virus by PCR: NEGATIVE
SARS Coronavirus 2 by RT PCR: NEGATIVE

## 2022-12-10 LAB — BASIC METABOLIC PANEL
Anion gap: 10 (ref 5–15)
BUN: 32 mg/dL — ABNORMAL HIGH (ref 8–23)
CO2: 28 mmol/L (ref 22–32)
Calcium: 9.4 mg/dL (ref 8.9–10.3)
Chloride: 99 mmol/L (ref 98–111)
Creatinine, Ser: 0.63 mg/dL (ref 0.44–1.00)
GFR, Estimated: >60 mL/min (ref >60)
Glucose, Bld: 152 mg/dL — ABNORMAL HIGH (ref 70–99)
Potassium: 3.8 mmol/L (ref 3.5–5.1)
Sodium: 137 mmol/L (ref 135–145)

## 2022-12-10 LAB — TROPONIN I (HIGH SENSITIVITY): Troponin I (High Sensitivity): 8 ng/L (ref <18)

## 2022-12-10 LAB — CBG MONITORING, ED: Glucose-Capillary: 196 mg/dL — ABNORMAL HIGH (ref 70–99)

## 2022-12-10 LAB — BRAIN NATRIURETIC PEPTIDE: B Natriuretic Peptide: 69.1 pg/mL (ref 0.0–100.0)

## 2022-12-10 NOTE — ED Triage Notes (Signed)
Pt was being treated for a UTI last week. Pt completed one round of abx and placed on a different abx starting 9/20. This week pt is having increased weakness and swelling in bilateral LE. Pt has dementia at baseline.

## 2022-12-10 NOTE — ED Provider Notes (Signed)
[facility] Provider Note   CSN: [id number] Arrival date & time: 12/10/22  1057     History  Chief Complaint  Patient presents with   Weakness    Melinda Edwards is a 89 y.o. female with a history of Alzheimer's dementia, CVA, GI bleed, hypertension, aortic stenosis here with weakness.  Her power of attorney Geneticist, molecular) is here along with her caretaker Melinda Edwards) from [facility].  They are concerned that she has been weaker, sleeping more ever since she started an antibiotic for UTI on 9/20.  They state that her PCP advised her to come to the ER because of concern for sepsis given her change in mentation. Caretaker denies vomiting, diarrhea.  She says her appetite has been normal. They deny any falls.  They state Melinda Edwards can communicate verbally, but is intermittent and significantly impaired by her Alzheimer's dementia.  Through her care palliative team follows the patient.  She is DNR/DNI which was confirmed with Melinda Edwards and her caretaker.  However, they do want to treat the treatable with IV fluids, antibiotics, etc. if possible.      Home Medications Prior to Admission medications   Medication Sig Start Date End Date Taking? Authorizing Provider  acetaminophen (TYLENOL) 325 MG tablet Take 650 mg by mouth every 6 (six) hours as needed for mild pain, moderate pain or fever.    [provider]  aspirin EC 81 MG tablet Take 81 mg by mouth daily. Swallow whole.    [provider]  b complex vitamins capsule Take 1 capsule by mouth daily.    [provider]  cholecalciferol (VITAMIN D) 25 MCG (1000 UNIT) tablet Take 3,000 Units by mouth daily.    [provider]  diltiazem (CARDIZEM CD) 120 MG 24 hr capsule Take 1 capsule (120 mg total) by mouth daily. 05/14/19 08/18/21  Marinda Elk, MD  mirtazapine (REMERON) 15 MG tablet Take 15 mg by mouth at bedtime.    [provider]  ondansetron (ZOFRAN) 4 MG tablet  Take 4 mg by mouth every 8 (eight) hours as needed for nausea or vomiting.    [provider]  polyethylene glycol (MIRALAX / GLYCOLAX) 17 g packet Take 17 g by mouth daily as needed for mild constipation. 03/21/20   Maury Dus, MD  Probiotic Product (PROBIOTIC-10 PO) Take by mouth.    [provider]      Allergies    Antihistamines, loratadine-type; Loratadine; and Namenda [memantine]    Review of Systems   Review of Systems  Neurological:  Positive for weakness.    Physical Exam Updated Vital Signs BP (!) 150/73 (BP Location: Right Arm)   Pulse 89   Temp 98.3 F (36.8 C) (Oral)   Resp 18   Ht 5\' 3"  (1.6 m)   Wt 66.7 kg   SpO2 95%   BMI 26.04 kg/m  Physical Exam Constitutional:      Comments: Sleeping comfortably but awakens for exam. Does not speak.  HENT:     Head: Normocephalic and atraumatic.     Mouth/Throat:     Mouth: Mucous membranes are moist.     Pharynx: Oropharynx is clear.  Eyes:     Extraocular Movements: Extraocular movements intact.     Conjunctiva/sclera: Conjunctivae normal.     Pupils: Pupils are equal, round, and reactive to light.  Cardiovascular:     Rate and Rhythm: Normal rate and regular rhythm.  Pulmonary:     Effort:  Pulmonary effort is normal.     Breath sounds: Normal breath sounds.  Abdominal:     General: Abdomen is flat.  Musculoskeletal:     Cervical back: Normal range of motion and neck supple.  Skin:    General: Skin is warm.     Comments: Chronic vascular changes in lower extremities. Lower extremities slightly edematous, non-pitting. Distal pulses easily palpable  Neurological:     Mental Status: Mental status is at baseline.     ED Results / Procedures / Treatments   Labs (all labs ordered are listed, but only abnormal results are displayed) Labs Reviewed  CBG MONITORING, ED - Abnormal; Notable for the following components:      Result Value   Glucose-Capillary 196 (*)    All other components  within normal limits  RESP PANEL BY RT-PCR (RSV, FLU A&B, COVID)  [id number]  BASIC METABOLIC PANEL  CBC  URINALYSIS, ROUTINE W REFLEX MICROSCOPIC  BRAIN NATRIURETIC PEPTIDE  HEPATIC FUNCTION PANEL  TROPONIN I (HIGH SENSITIVITY)    EKG None  Radiology No results found.  Procedures Procedures    Medications Ordered in ED Medications - No data to display  ED Course/ Medical Decision Making/ A&P                                 Medical Decision Making 89 year-old female with a history of Alzheimer's dementia, 24-hour care presented with concern for lower extremity swelling, weakness and UTI. Hemodynamically stable, well-hydrated, well-nourished, sleepy but easily arousable on exam.  In terms of her lower extremity swelling, no signs of DVT, cellulitis. No dyspnea concerning for heart failure. Lower extremity swelling is most consistent with dependent edema.  Getting basic blood work- CBC, CMP, troponin, lactate for evaluation, as she is not full comfort care per POA/caretaker.  Patient care signed out to attending Dr. Lockie Mola- see his note for further plan of care.  Amount and/or Complexity of Data Reviewed Labs: ordered. Radiology: ordered.          Final Clinical Impression(s) / ED Diagnoses Final diagnoses:  None    Rx / DC Orders ED Discharge Orders     None         Darral Dash, DO 12/10/22 1155    Curatolo, Adam, DO 12/10/22 1401

## 2022-12-14 DIAGNOSIS — D649 Anemia, unspecified: Secondary | ICD-10-CM | POA: Diagnosis not present

## 2022-12-14 DIAGNOSIS — R600 Localized edema: Secondary | ICD-10-CM | POA: Diagnosis not present

## 2022-12-14 DIAGNOSIS — Z7189 Other specified counseling: Secondary | ICD-10-CM | POA: Diagnosis not present

## 2022-12-14 DIAGNOSIS — G301 Alzheimer's disease with late onset: Secondary | ICD-10-CM | POA: Diagnosis not present

## 2022-12-14 DIAGNOSIS — I480 Paroxysmal atrial fibrillation: Secondary | ICD-10-CM | POA: Diagnosis not present

## 2022-12-14 DIAGNOSIS — R627 Adult failure to thrive: Secondary | ICD-10-CM | POA: Diagnosis not present

## 2022-12-14 DIAGNOSIS — J8410 Pulmonary fibrosis, unspecified: Secondary | ICD-10-CM | POA: Diagnosis not present

## 2022-12-14 DIAGNOSIS — Z8673 Personal history of transient ischemic attack (TIA), and cerebral infarction without residual deficits: Secondary | ICD-10-CM | POA: Diagnosis not present

## 2022-12-14 DIAGNOSIS — Z8719 Personal history of other diseases of the digestive system: Secondary | ICD-10-CM | POA: Diagnosis not present

## 2022-12-15 DIAGNOSIS — R2241 Localized swelling, mass and lump, right lower limb: Secondary | ICD-10-CM | POA: Diagnosis not present

## 2022-12-15 DIAGNOSIS — M79604 Pain in right leg: Secondary | ICD-10-CM | POA: Diagnosis not present

## 2022-12-28 ENCOUNTER — Other Ambulatory Visit (HOSPITAL_COMMUNITY): Payer: Self-pay | Admitting: Family Medicine

## 2022-12-28 DIAGNOSIS — Z23 Encounter for immunization: Secondary | ICD-10-CM | POA: Diagnosis not present

## 2022-12-28 DIAGNOSIS — M79605 Pain in left leg: Secondary | ICD-10-CM

## 2022-12-28 DIAGNOSIS — R630 Anorexia: Secondary | ICD-10-CM | POA: Diagnosis not present

## 2022-12-28 DIAGNOSIS — F0390 Unspecified dementia without behavioral disturbance: Secondary | ICD-10-CM | POA: Diagnosis not present

## 2022-12-28 DIAGNOSIS — R4182 Altered mental status, unspecified: Secondary | ICD-10-CM | POA: Diagnosis not present

## 2022-12-28 DIAGNOSIS — I480 Paroxysmal atrial fibrillation: Secondary | ICD-10-CM | POA: Diagnosis not present

## 2022-12-29 ENCOUNTER — Ambulatory Visit (HOSPITAL_COMMUNITY): Admission: RE | Admit: 2022-12-29 | Payer: Medicare PPO | Source: Ambulatory Visit

## 2022-12-31 DIAGNOSIS — I82412 Acute embolism and thrombosis of left femoral vein: Secondary | ICD-10-CM | POA: Diagnosis not present

## 2022-12-31 DIAGNOSIS — I82452 Acute embolism and thrombosis of left peroneal vein: Secondary | ICD-10-CM | POA: Diagnosis not present

## 2022-12-31 DIAGNOSIS — I82432 Acute embolism and thrombosis of left popliteal vein: Secondary | ICD-10-CM | POA: Diagnosis not present

## 2022-12-31 NOTE — Progress Notes (Signed)
ok 

## 2023-01-03 DIAGNOSIS — Z515 Encounter for palliative care: Secondary | ICD-10-CM | POA: Diagnosis not present

## 2023-01-03 DIAGNOSIS — G309 Alzheimer's disease, unspecified: Secondary | ICD-10-CM | POA: Diagnosis not present

## 2023-01-03 DIAGNOSIS — I480 Paroxysmal atrial fibrillation: Secondary | ICD-10-CM | POA: Diagnosis not present

## 2023-01-03 DIAGNOSIS — I82492 Acute embolism and thrombosis of other specified deep vein of left lower extremity: Secondary | ICD-10-CM | POA: Diagnosis not present

## 2023-05-14 DEATH — deceased
